# Patient Record
Sex: Male | Born: 1980 | Race: White | Hispanic: No | Marital: Single | Smoking: Current every day smoker
Health system: Southern US, Community
[De-identification: ages and names within clinical notes are randomized; demographics above are authoritative.]

## PROBLEM LIST (undated history)

## (undated) DIAGNOSIS — G4733 Obstructive sleep apnea (adult) (pediatric): Secondary | ICD-10-CM

## (undated) DIAGNOSIS — L0390 Cellulitis, unspecified: Secondary | ICD-10-CM

## (undated) DIAGNOSIS — D649 Anemia, unspecified: Secondary | ICD-10-CM

## (undated) DIAGNOSIS — N492 Inflammatory disorders of scrotum: Secondary | ICD-10-CM

## (undated) DIAGNOSIS — G4730 Sleep apnea, unspecified: Secondary | ICD-10-CM

## (undated) DIAGNOSIS — E662 Morbid (severe) obesity with alveolar hypoventilation: Secondary | ICD-10-CM

## (undated) DIAGNOSIS — I10 Essential (primary) hypertension: Secondary | ICD-10-CM

## (undated) DIAGNOSIS — M793 Panniculitis, unspecified: Secondary | ICD-10-CM

## (undated) DIAGNOSIS — Z8709 Personal history of other diseases of the respiratory system: Secondary | ICD-10-CM

## (undated) DIAGNOSIS — I509 Heart failure, unspecified: Secondary | ICD-10-CM

---

## 2003-04-07 ENCOUNTER — Emergency Department (HOSPITAL_COMMUNITY): Admission: EM | Admit: 2003-04-07 | Discharge: 2003-04-07 | Payer: Self-pay | Admitting: Emergency Medicine

## 2006-11-23 ENCOUNTER — Emergency Department (HOSPITAL_COMMUNITY): Admission: EM | Admit: 2006-11-23 | Discharge: 2006-11-24 | Payer: Self-pay | Admitting: Emergency Medicine

## 2006-12-27 ENCOUNTER — Inpatient Hospital Stay (HOSPITAL_COMMUNITY): Admission: EM | Admit: 2006-12-27 | Discharge: 2007-01-01 | Payer: Self-pay | Admitting: Emergency Medicine

## 2006-12-30 ENCOUNTER — Ambulatory Visit: Payer: Self-pay | Admitting: Vascular Surgery

## 2007-02-02 ENCOUNTER — Ambulatory Visit: Payer: Self-pay | Admitting: Internal Medicine

## 2007-02-03 ENCOUNTER — Ambulatory Visit: Payer: Self-pay | Admitting: *Deleted

## 2007-08-07 ENCOUNTER — Ambulatory Visit: Payer: Self-pay | Admitting: Nurse Practitioner

## 2008-01-25 ENCOUNTER — Ambulatory Visit: Payer: Self-pay | Admitting: Internal Medicine

## 2008-10-07 ENCOUNTER — Ambulatory Visit: Payer: Self-pay | Admitting: Vascular Surgery

## 2008-10-07 ENCOUNTER — Ambulatory Visit: Payer: Self-pay | Admitting: Internal Medicine

## 2008-10-07 ENCOUNTER — Encounter (INDEPENDENT_AMBULATORY_CARE_PROVIDER_SITE_OTHER): Payer: Self-pay | Admitting: Emergency Medicine

## 2008-10-07 ENCOUNTER — Inpatient Hospital Stay (HOSPITAL_COMMUNITY): Admission: EM | Admit: 2008-10-07 | Discharge: 2008-10-10 | Payer: Self-pay | Admitting: Emergency Medicine

## 2008-10-07 ENCOUNTER — Encounter: Payer: Self-pay | Admitting: Cardiovascular Disease

## 2008-10-07 ENCOUNTER — Ambulatory Visit: Payer: Self-pay | Admitting: Family Medicine

## 2008-10-08 ENCOUNTER — Encounter (INDEPENDENT_AMBULATORY_CARE_PROVIDER_SITE_OTHER): Payer: Self-pay | Admitting: Internal Medicine

## 2008-11-07 ENCOUNTER — Encounter: Payer: Self-pay | Admitting: Cardiovascular Disease

## 2008-11-07 ENCOUNTER — Ambulatory Visit: Payer: Self-pay | Admitting: Family Medicine

## 2008-11-09 ENCOUNTER — Encounter (INDEPENDENT_AMBULATORY_CARE_PROVIDER_SITE_OTHER): Payer: Self-pay | Admitting: *Deleted

## 2008-11-12 DIAGNOSIS — E678 Other specified hyperalimentation: Secondary | ICD-10-CM | POA: Insufficient documentation

## 2008-11-12 DIAGNOSIS — L03119 Cellulitis of unspecified part of limb: Secondary | ICD-10-CM

## 2008-11-12 DIAGNOSIS — I10 Essential (primary) hypertension: Secondary | ICD-10-CM

## 2008-11-12 DIAGNOSIS — Z6841 Body Mass Index (BMI) 40.0 and over, adult: Secondary | ICD-10-CM

## 2008-11-12 DIAGNOSIS — G4733 Obstructive sleep apnea (adult) (pediatric): Secondary | ICD-10-CM

## 2008-11-12 DIAGNOSIS — L02419 Cutaneous abscess of limb, unspecified: Secondary | ICD-10-CM | POA: Insufficient documentation

## 2009-01-16 ENCOUNTER — Emergency Department (HOSPITAL_COMMUNITY): Admission: EM | Admit: 2009-01-16 | Discharge: 2009-01-17 | Payer: Self-pay | Admitting: Emergency Medicine

## 2009-01-23 ENCOUNTER — Inpatient Hospital Stay (HOSPITAL_COMMUNITY): Admission: EM | Admit: 2009-01-23 | Discharge: 2009-01-31 | Payer: Self-pay | Admitting: Emergency Medicine

## 2009-02-21 ENCOUNTER — Ambulatory Visit: Payer: Self-pay | Admitting: Internal Medicine

## 2009-02-21 ENCOUNTER — Encounter (INDEPENDENT_AMBULATORY_CARE_PROVIDER_SITE_OTHER): Payer: Self-pay | Admitting: Internal Medicine

## 2009-02-21 LAB — CONVERTED CEMR LAB
ALT: 18 units/L (ref 0–53)
CO2: 29 meq/L (ref 19–32)
Calcium: 9.3 mg/dL (ref 8.4–10.5)
Chloride: 93 meq/L — ABNORMAL LOW (ref 96–112)
Creatinine, Ser: 0.91 mg/dL (ref 0.40–1.50)
Eosinophils Absolute: 0.1 10*3/uL (ref 0.0–0.7)
Glucose, Bld: 116 mg/dL — ABNORMAL HIGH (ref 70–99)
HCT: 48.8 % (ref 39.0–52.0)
Hemoglobin: 15 g/dL (ref 13.0–17.0)
Lymphs Abs: 1.9 10*3/uL (ref 0.7–4.0)
MCHC: 30.7 g/dL (ref 30.0–36.0)
MCV: 78 fL (ref 78.0–100.0)
Monocytes Absolute: 1.3 10*3/uL — ABNORMAL HIGH (ref 0.1–1.0)
Monocytes Relative: 14 % — ABNORMAL HIGH (ref 3–12)
Neutrophils Relative %: 64 % (ref 43–77)
Pro B Natriuretic peptide (BNP): 12.6 pg/mL (ref 0.0–100.0)
RBC: 6.26 M/uL — ABNORMAL HIGH (ref 4.22–5.81)
Total Protein: 8 g/dL (ref 6.0–8.3)
WBC: 9.3 10*3/uL (ref 4.0–10.5)

## 2009-03-07 ENCOUNTER — Ambulatory Visit: Payer: Self-pay | Admitting: Internal Medicine

## 2009-03-07 ENCOUNTER — Encounter (INDEPENDENT_AMBULATORY_CARE_PROVIDER_SITE_OTHER): Payer: Self-pay | Admitting: Internal Medicine

## 2009-03-07 LAB — CONVERTED CEMR LAB: Potassium: 4 meq/L (ref 3.5–5.3)

## 2009-03-08 ENCOUNTER — Encounter (INDEPENDENT_AMBULATORY_CARE_PROVIDER_SITE_OTHER): Payer: Self-pay | Admitting: Internal Medicine

## 2009-03-23 ENCOUNTER — Encounter (INDEPENDENT_AMBULATORY_CARE_PROVIDER_SITE_OTHER): Payer: Self-pay | Admitting: *Deleted

## 2009-03-23 ENCOUNTER — Ambulatory Visit: Payer: Self-pay | Admitting: Cardiology

## 2009-03-23 ENCOUNTER — Encounter: Payer: Self-pay | Admitting: Cardiology

## 2009-03-23 ENCOUNTER — Ambulatory Visit: Payer: Self-pay

## 2009-03-23 DIAGNOSIS — F17200 Nicotine dependence, unspecified, uncomplicated: Secondary | ICD-10-CM

## 2009-03-23 DIAGNOSIS — R609 Edema, unspecified: Secondary | ICD-10-CM

## 2009-03-28 ENCOUNTER — Ambulatory Visit: Payer: Self-pay | Admitting: Internal Medicine

## 2009-05-23 ENCOUNTER — Encounter (INDEPENDENT_AMBULATORY_CARE_PROVIDER_SITE_OTHER): Payer: Self-pay | Admitting: *Deleted

## 2010-10-21 LAB — URINALYSIS, ROUTINE W REFLEX MICROSCOPIC
Bilirubin Urine: NEGATIVE
Glucose, UA: NEGATIVE mg/dL
Leukocytes, UA: NEGATIVE
Nitrite: NEGATIVE
Protein, ur: 100 mg/dL — AB
Specific Gravity, Urine: 1.023 (ref 1.005–1.030)
Urobilinogen, UA: 2 mg/dL — ABNORMAL HIGH (ref 0.0–1.0)
pH: 5.5 (ref 5.0–8.0)

## 2010-10-21 LAB — CBC
HCT: 39.7 % (ref 39.0–52.0)
HCT: 40.6 % (ref 39.0–52.0)
HCT: 42.4 % (ref 39.0–52.0)
Hemoglobin: 12 g/dL — ABNORMAL LOW (ref 13.0–17.0)
Hemoglobin: 12.4 g/dL — ABNORMAL LOW (ref 13.0–17.0)
MCHC: 30.3 g/dL (ref 30.0–36.0)
MCHC: 30.5 g/dL (ref 30.0–36.0)
MCV: 75.6 fL — ABNORMAL LOW (ref 78.0–100.0)
MCV: 74.3 fL — ABNORMAL LOW (ref 78.0–100.0)
MCV: 74.4 fL — ABNORMAL LOW (ref 78.0–100.0)
MCV: 74.8 fL — ABNORMAL LOW (ref 78.0–100.0)
MCV: 75 fL — ABNORMAL LOW (ref 78.0–100.0)
Platelets: 190 10*3/uL (ref 150–400)
Platelets: 194 10*3/uL (ref 150–400)
Platelets: 200 10*3/uL (ref 150–400)
Platelets: 226 10*3/uL (ref 150–400)
RBC: 5.3 MIL/uL (ref 4.22–5.81)
RBC: 5.5 MIL/uL (ref 4.22–5.81)
RDW: 19.3 % — ABNORMAL HIGH (ref 11.5–15.5)
WBC: 8.2 10*3/uL (ref 4.0–10.5)
WBC: 10 10*3/uL (ref 4.0–10.5)
WBC: 11.8 10*3/uL — ABNORMAL HIGH (ref 4.0–10.5)
WBC: 9.9 10*3/uL (ref 4.0–10.5)

## 2010-10-21 LAB — BASIC METABOLIC PANEL
BUN: 4 mg/dL — ABNORMAL LOW (ref 6–23)
BUN: 6 mg/dL (ref 6–23)
BUN: 6 mg/dL (ref 6–23)
BUN: 5 mg/dL — ABNORMAL LOW (ref 6–23)
CO2: 34 mEq/L — ABNORMAL HIGH (ref 19–32)
CO2: 37 mEq/L — ABNORMAL HIGH (ref 19–32)
Calcium: 8.6 mg/dL (ref 8.4–10.5)
Calcium: 8.8 mg/dL (ref 8.4–10.5)
Calcium: 9.1 mg/dL (ref 8.4–10.5)
Calcium: 8.4 mg/dL (ref 8.4–10.5)
Chloride: 92 mEq/L — ABNORMAL LOW (ref 96–112)
Chloride: 100 mEq/L (ref 96–112)
Chloride: 100 mEq/L (ref 96–112)
Chloride: 97 mEq/L (ref 96–112)
Creatinine, Ser: 0.78 mg/dL (ref 0.4–1.5)
Creatinine, Ser: 0.8 mg/dL (ref 0.4–1.5)
Creatinine, Ser: 0.64 mg/dL (ref 0.4–1.5)
GFR calc Af Amer: >60 mL/min (ref >60)
GFR calc Af Amer: >60 mL/min (ref >60)
GFR calc Af Amer: >60 mL/min (ref >60)
GFR calc non Af Amer: >60 mL/min (ref >60)
GFR calc non Af Amer: >60 mL/min (ref >60)
GFR calc non Af Amer: >60 mL/min (ref >60)
Glucose, Bld: 113 mg/dL — ABNORMAL HIGH (ref 70–99)
Glucose, Bld: 99 mg/dL (ref 70–99)
Glucose, Bld: 95 mg/dL (ref 70–99)
Potassium: 4 mEq/L (ref 3.5–5.1)
Potassium: 4.2 mEq/L (ref 3.5–5.1)
Potassium: 4.2 mEq/L (ref 3.5–5.1)
Sodium: 136 mEq/L (ref 135–145)
Sodium: 136 mEq/L (ref 135–145)
Sodium: 138 mEq/L (ref 135–145)

## 2010-10-21 LAB — URINE MICROSCOPIC-ADD ON

## 2010-10-21 LAB — DIFFERENTIAL
Basophils Absolute: 0.1 10*3/uL (ref 0.0–0.1)
Basophils Relative: 0 % (ref 0–1)
Basophils Relative: 1 % (ref 0–1)
Eosinophils Absolute: 0.2 10*3/uL (ref 0.0–0.7)
Eosinophils Absolute: 0.2 10*3/uL (ref 0.0–0.7)
Lymphocytes Relative: 10 % — ABNORMAL LOW (ref 12–46)
Lymphocytes Relative: 13 % (ref 12–46)
Lymphs Abs: 1.1 10*3/uL (ref 0.7–4.0)
Lymphs Abs: 1.5 10*3/uL (ref 0.7–4.0)
Lymphs Abs: 1.6 10*3/uL (ref 0.7–4.0)
Neutro Abs: 8.3 10*3/uL — ABNORMAL HIGH (ref 1.7–7.7)
Neutro Abs: 9.1 10*3/uL — ABNORMAL HIGH (ref 1.7–7.7)
Neutrophils Relative %: 69 % (ref 43–77)

## 2010-10-21 LAB — POCT I-STAT, CHEM 8
BUN: 11 mg/dL (ref 6–23)
Calcium, Ion: 1.02 mmol/L — ABNORMAL LOW (ref 1.12–1.32)
Chloride: 97 mEq/L (ref 96–112)
Creatinine, Ser: 0.9 mg/dL (ref 0.4–1.5)
Glucose, Bld: 110 mg/dL — ABNORMAL HIGH (ref 70–99)
HCT: 47 % (ref 39.0–52.0)
Hemoglobin: 16 g/dL (ref 13.0–17.0)
Potassium: 4.8 mEq/L (ref 3.5–5.1)
Sodium: 136 mEq/L (ref 135–145)
TCO2: 35 mmol/L (ref 0–100)

## 2010-10-21 LAB — BRAIN NATRIURETIC PEPTIDE
Pro B Natriuretic peptide (BNP): 232 pg/mL — ABNORMAL HIGH (ref 0.0–100.0)
Pro B Natriuretic peptide (BNP): 205 pg/mL — ABNORMAL HIGH (ref 0.0–100.0)

## 2010-10-21 LAB — CULTURE, BLOOD (ROUTINE X 2)
Culture: NO GROWTH
Culture: NO GROWTH

## 2010-10-21 LAB — RETICULOCYTES: Retic Count, Absolute: 142.5 10*3/uL (ref 19.0–186.0)

## 2010-10-21 LAB — FERRITIN: Ferritin: 12 ng/mL — ABNORMAL LOW (ref 22–322)

## 2010-10-21 LAB — IRON AND TIBC
Iron: 20 ug/dL — ABNORMAL LOW (ref 42–135)
TIBC: 390 ug/dL (ref 215–435)

## 2010-10-21 LAB — VANCOMYCIN, TROUGH: Vancomycin Tr: 14.2 ug/mL (ref 10.0–20.0)

## 2010-10-25 LAB — CBC
HCT: 38.5 % — ABNORMAL LOW (ref 39.0–52.0)
Hemoglobin: 12 g/dL — ABNORMAL LOW (ref 13.0–17.0)
Hemoglobin: 12.2 g/dL — ABNORMAL LOW (ref 13.0–17.0)
MCHC: 31.2 g/dL (ref 30.0–36.0)
MCHC: 31.5 g/dL (ref 30.0–36.0)
MCV: 76 fL — ABNORMAL LOW (ref 78.0–100.0)
MCV: 76.8 fL — ABNORMAL LOW (ref 78.0–100.0)
MCV: 77 fL — ABNORMAL LOW (ref 78.0–100.0)
Platelets: 277 10*3/uL (ref 150–400)
Platelets: 281 10*3/uL (ref 150–400)
RBC: 4.97 MIL/uL (ref 4.22–5.81)
RBC: 4.99 MIL/uL (ref 4.22–5.81)
RDW: 17.1 % — ABNORMAL HIGH (ref 11.5–15.5)
RDW: 17.4 % — ABNORMAL HIGH (ref 11.5–15.5)
WBC: 13.4 10*3/uL — ABNORMAL HIGH (ref 4.0–10.5)
WBC: 9.2 10*3/uL (ref 4.0–10.5)

## 2010-10-25 LAB — BASIC METABOLIC PANEL
BUN: 8 mg/dL (ref 6–23)
BUN: 9 mg/dL (ref 6–23)
CO2: 29 mEq/L (ref 19–32)
CO2: 34 mEq/L — ABNORMAL HIGH (ref 19–32)
Chloride: 98 mEq/L (ref 96–112)
Chloride: 99 mEq/L (ref 96–112)
Chloride: 99 mEq/L (ref 96–112)
Chloride: 99 mEq/L (ref 96–112)
Creatinine, Ser: 0.63 mg/dL (ref 0.4–1.5)
Creatinine, Ser: 0.65 mg/dL (ref 0.4–1.5)
Creatinine, Ser: 0.67 mg/dL (ref 0.4–1.5)
GFR calc Af Amer: >60 mL/min (ref >60)
GFR calc Af Amer: >60 mL/min (ref >60)
GFR calc non Af Amer: >60 mL/min (ref >60)
Potassium: 4.3 mEq/L (ref 3.5–5.1)
Potassium: 4.8 mEq/L (ref 3.5–5.1)
Sodium: 137 mEq/L (ref 135–145)

## 2010-10-25 LAB — URINALYSIS, MICROSCOPIC ONLY
Bilirubin Urine: NEGATIVE
Nitrite: NEGATIVE
Protein, ur: 30 mg/dL — AB
Urobilinogen, UA: 1 mg/dL (ref 0.0–1.0)

## 2010-10-25 LAB — CULTURE, BLOOD (ROUTINE X 2)

## 2010-10-25 LAB — DIFFERENTIAL
Basophils Absolute: 0 10*3/uL (ref 0.0–0.1)
Basophils Relative: 0 % (ref 0–1)
Eosinophils Absolute: 0.1 10*3/uL (ref 0.0–0.7)
Eosinophils Absolute: 0.1 10*3/uL (ref 0.0–0.7)
Lymphs Abs: 1.3 10*3/uL (ref 0.7–4.0)
Lymphs Abs: 1.6 10*3/uL (ref 0.7–4.0)
Monocytes Relative: 13 % — ABNORMAL HIGH (ref 3–12)
Neutrophils Relative %: 71 % (ref 43–77)
Neutrophils Relative %: 76 % (ref 43–77)

## 2010-10-25 LAB — HEPATIC FUNCTION PANEL
AST: 19 U/L (ref 0–37)
Albumin: 2.8 g/dL — ABNORMAL LOW (ref 3.5–5.2)
Total Protein: 7.7 g/dL (ref 6.0–8.3)

## 2010-10-25 LAB — HEMOGLOBIN A1C
Hgb A1c MFr Bld: 5.4 % (ref 4.6–6.1)
Mean Plasma Glucose: 108 mg/dL

## 2010-10-25 LAB — RAPID URINE DRUG SCREEN, HOSP PERFORMED
Amphetamines: NOT DETECTED
Barbiturates: NOT DETECTED
Benzodiazepines: NOT DETECTED
Opiates: NOT DETECTED
Tetrahydrocannabinol: NOT DETECTED

## 2010-10-25 LAB — BLOOD GAS, ARTERIAL
Bicarbonate: 32.9 mEq/L — ABNORMAL HIGH (ref 20.0–24.0)
FIO2: 0.21 %
pCO2 arterial: 62.9 mmHg (ref 35.0–45.0)
pH, Arterial: 7.339 — ABNORMAL LOW (ref 7.350–7.450)
pO2, Arterial: 66.1 mmHg — ABNORMAL LOW (ref 80.0–100.0)

## 2010-10-25 LAB — LIPID PANEL
HDL: 35 mg/dL — ABNORMAL LOW (ref >39)
LDL Cholesterol: 71 mg/dL (ref 0–99)
Total CHOL/HDL Ratio: 3.4 RATIO
Triglycerides: 61 mg/dL (ref <150)
VLDL: 12 mg/dL (ref 0–40)

## 2010-10-25 LAB — APTT: aPTT: 33 seconds (ref 24–37)

## 2010-10-25 LAB — PROTIME-INR: Prothrombin Time: 16.1 seconds — ABNORMAL HIGH (ref 11.6–15.2)

## 2010-11-27 NOTE — Discharge Summary (Signed)
NAME:  Andre [name], Andre [name] NO.:  [id number]   MEDICAL RECORD NO.:  [id number]          PATIENT TYPE:  INP   LOCATION:  1340                         FACILITY:  [facility]   PHYSICIAN:  Hillery Aldo, M.D.   DATE OF BIRTH:  1980/10/25   DATE OF ADMISSION:  01/23/2009  DATE OF DISCHARGE:  01/31/2009                               DISCHARGE SUMMARY   PRIMARY CARE PHYSICIAN:  Dr. Donia Guiles at [facility].   DISCHARGE DIAGNOSES:  1. Scrotal cellulitis.  2. Iron-deficiency anemia.  3. Morbid obesity, body mass index 87.4.  4. Obstructive sleep apnea/obesity hypoventilation syndrome.  5. Bronchospasm.  6. Tobacco abuse.  7. Hypertension.  8. Probable diastolic congestive heart failure.   DISCHARGE MEDICATIONS:  1. Aspirin 81 mg p.o. daily.  2. Clonidine 0.2 mg p.o. b.i.d.  3. Ferrous sulfate 325 mg p.o. b.i.d.  4. Lasix 80 mg p.o. b.i.d.  5. Zaroxolyn 5 mg p.o. daily.  6. Potassium chloride 20 mEq p.o. b.i.d.  7. Bactrim DS 2 tabs p.o. q.12 hours x7 days.  8. Nystatin 100,000 units/g cream to scrotum topically q.12 hours.  9. Benadryl 25 mg p.o. q.4 hours p.r.n. pruritus.  10.Fluid restriction 1.5 liters per 24 hours.   CONSULTATIONS:  None.   BRIEF ADMISSION HPI:  The patient is a 30 year old morbidly obese male  who presented to the hospital with a chief complaint of scrotal swelling  and pain.  He was originally evaluated in the emergency department on  January 17, 2009 when an ultrasound was obtained, and did not show any  evidence of torsion or epididymal orchitis.  The patient was discharged  home on a combination of Keflex and doxycycline, but despite religiously  taking his medications represented to the hospital on January 23, 2009 with  ongoing pain and swelling.  He was subsequently referred to the  Hospitalist Service for admission.  For the full details, please see the  dictated report done by Dr. Sharon Seller.   PROCEDURES AND DIAGNOSTIC STUDIES:  1. Chest  x-ray on January 23, 2009 showed progressive cardiomegaly with      increased pulmonary vascular congestion.  2. Scrotal ultrasound on January 24, 2009 showed scrotal thickening      compatible with cellulitis.  Negative for abscess.  Negative for      epididymo-orchitis or torsion.  Right epididymal spermatocele or      cyst noted.   DISCHARGE LABORATORY VALUES:  Blood cultures were negative x2.  BNP was  232.  Sodium was 136, potassium 4.0, chloride 92, bicarb 40, BUN 8,  creatinine 0.78, glucose 113.  Calcium 8.9.  White blood cell count was  8.2, hemoglobin 12, hematocrit 39.6, platelets 190.   HOSPITAL COURSE:  1. Scrotal cellulitis:  The patient has now completed 7 days of      treatment with vancomycin and Zosyn, and has been transitioned over      to p.o. double strength Septra.  The cellulitis is beginning to      respond to a combination of IV and now p.o. antibiotics and      aggressive diuresis.  He will need close  follow-up after discharge      to ensure that his scrotum continues to decrease in size and      erythema.  2. Probable diastolic dysfunction/congestive heart failure:  The      patient did have elevated BNP values consistent with probable      congestive heart failure.  A two-dimensional echocardiogram was      obtained back in March 2010, but because of the patient's size was      not an adequate study.  Would recommend outpatient referral to      schedule a transesophageal echocardiogram for definitive evaluation      of his heart.  The patient has been put on aggressive diuresis      which has caused a much improved appearance of his lower extremity      swelling.  3. Iron-deficiency anemia:  The patient does have a mild anemia, and      anemia panel was done to evaluate the source of his anemia.  He did      have low iron at 20 with a normal total iron-binding capacity, 5%      saturation.  Ferritin was low at 12, and B12 as well as folic acid      studies were  normal.  This is consistent with iron-deficiency      anemia.  The patient was put on iron supplementation, but will need      outpatient GI evaluation at some point.  4. Morbid obesity:  The patient was extensively counseled by myself      and by the dietician regarding importance of appropriate weight      loss given his BMI of 87.4.  He is not a candidate for bariatric      surgery with a BMI this high.  5. Obstructive sleep apnea/obesity hypoventilation syndrome:  The      patient had been scheduled for an outpatient sleep study, but      canceled the appointment.  We have recommended that he follow up      and reschedule this appointment to prevent medical complications      and progression of underlying hypertension and pulmonary disease.  6. Tobacco abuse:  The patient was counseled extensively by myself      regarding importance of tobacco cessation given his comorbidities.  7. Hypertension:  The patient's Lasix, clonidine, and Zaroxolyn have      been reasonably controlling his blood pressures.  He will be      discharged on a slightly higher dose of clonidine secondary to a      diastolic pressure in the mid 90s.  He should have close follow-up      with his primary care physician for ongoing evaluation of his blood      pressure and dose titration of his medications as needed.   DISPOSITION:  The patient is medically stable and will be discharged  home.  We will have the social worker meet with him and discuss with him  how to apply for disability.  The patient is encouraged to follow up at  [facility] with Dr. Reche Dixon in 1-2 weeks.   Time spent coordinating care for discharge and discharge instructions  equals 35 minutes.      Hillery Aldo, M.D.  Electronically Signed    CR/MEDQ  D:  01/31/2009  T:  01/31/2009  Job:  [id number]   cc:   Dala Dock

## 2010-11-27 NOTE — Discharge Summary (Signed)
NAME:  Andre [name], Andre [name] NO.:  [id number]   MEDICAL RECORD NO.:  [id number]          PATIENT TYPE:  INP   LOCATION:  1535                         FACILITY:  [facility]   PHYSICIAN:  Marcellus Scott, MD     DATE OF BIRTH:  October 11, 1980   DATE OF ADMISSION:  12/27/2006  DATE OF DISCHARGE:  01/01/2007                               DISCHARGE SUMMARY   PRIMARY CARE PHYSICIAN:  The patient is unassigned to the [facility] but will follow up at [facility].   DISCHARGE DIAGNOSES:  1. Left lower extremity cellulitis.  2. Morbid obesity.  3. Rule out sleep apnea syndrome.  4. Newly diagnosed hypertension.  5. Hyperglycemia, question newly diagnosed type 2 diabetes.  6. Normocytic anemia.  7. Probable metabolic syndrome.  8. Mild transaminitis.   DISCHARGE MEDICATIONS:  1. Keflex 500 mg p.o. four times a day for an additional 10 days which      will complete a two-week course of antibiotics.  2. Hydrochlorothiazide 25 mg p.o. daily.  3. Tylenol 650 mg p.o. q.6h. p.r.n.  4. Prilosec over-the-counter 20 mg p.o. daily.  5. Albuterol 90 mcg per spray MDI, two puffs q.4-6h. p.r.n.   PROCEDURES:  1. Left lower extremity Doppler which was done on December 30, 2006.      Impression:  No evidence of DVT, superficial thrombosis, or Baker's      cyst.  2. On December 27, 2006, again, left lower extremity Doppler with no      obvious evidence of DVT, superficial thrombosis, or Baker's cyst.   PERTINENT LABORATORY DATA:  Hepatic panel remarkable for AST of 49, ALT  45, albumin 2.4, alkaline phosphatase 66, total bilirubin of 0.9.  Basic  metabolic panel unremarkable, with a BUN of 6, creatinine of 0.69, and  glucose of 113.  CBC with hemoglobin 11.5, hematocrit 34.1, MCV 82.3,  platelets 220, white blood cells 9.1.  Wound culture with moderate group  A strep (Streptococcus pyogenes isolated).  Urine culture was no growth.  Blood culture with no growth to  date.  Final cultures to be followed up  as an outpatient.  TSH 2.594.  hemoglobin A1C 6.2.  D-dimer 1.68.  Point-  of-care cardiac markers were negative.   CONSULTATIONS:  Nutrition education.   HOSPITAL COURSE AND PATIENT DISPOSITION:  For details of the initial  part of the admission, please refer to the history and physical note  done by Dr. Darnelle Catalan on December 27, 2006.  In summary, Mr. Biernat is a 30-  year-old pleasant, morbidly obese male patient with no known past  medical history who presented with complaints of left lower extremity  erythema, swelling, pain, fever, and chills for 3 days prior to  admission.  Further evaluation revealed extensive left lower extremity  cellulitis.  He was thereby admitted for further evaluation and  management.  He had a white blood cell count of 19.5 and a temperature  of 101.4 on admission.   Problem 1.  Severe left lower extremity cellulitis.  The patient was  admitted to the hospital.  Blood cultures were sent  off.  A wound  culture from weeping serosanguineous exudate was also sent.  The culture  results are as above.  The patient was initially placed on IV Zosyn  alone.  Subsequently, vancomycin was added, thinking that there was not  adequate improvement.  The patient's left lower extremity was also  elevated, and he was provided with pain medications.  With these  measures, the patient has progressively done well, with his redness and  pain almost resolved.  The swelling is slower to resolve; however, it is  improving.  The patient is able to ambulate with minimal pain on initial  few steps.  The patient will be discharged on oral Keflex to complete a  total 2-week course of antibiotics.  He has been advised to seek  immediate medical attention if there is any further deterioration in his  condition.   Problem 2.  Morbid obesity.  The patient was consulted by the  dieticians.  He has been instructed to strictly adhere to diet,   exercise, and weight loss.   Problem 3.  Rule out sleep apnea syndrome.  The patient was empirically  placed on CPAP in the hospital; however, he needs to have an outpatient  workup with a sleep study to rule out sleep apnea syndrome.  Obviously,  he needs to loose weight which will help this.   Problem 4.  Hypertension.  For mildly elevated blood pressures, the  patient was started on hydrochlorothiazide with improvement in his blood  pressures.   Problem 5.  Questionable newly diagnosed type 2 diabetes.  The patient  was placed on a sliding scale insulin here; however, he is not being  discharged on any medications at this time.  Again, for outpatient  diabetes education, diet, weight loss, and exercise, and this has to be  followed up as an outpatient.   Problem 6.  Normocytic anemia.  For outpatient evaluation as deemed  necessary.   Problem 7.  Questionable metabolic syndrome.  The patient already fits 3  criteria for this but to obtain fasting lipids as an outpatient.   Problem 8.  Mildly elevated AST and ALT probably secondary to fatty  liver.  The patient is asymptomatic.  Again, these are to be followed up  as an outpatient.      Marcellus Scott, MD  Electronically Signed     AH/MEDQ  D:  01/01/2007  T:  01/01/2007  Job:  [id number]   cc:   Dineen Kid. Reche Dixon, M.D.  Fax: [phone]

## 2010-11-27 NOTE — Discharge Summary (Signed)
NAME:  Andre [name], Andre [name] NO.:  [id number]   MEDICAL RECORD NO.:  [id number]          PATIENT TYPE:  INP   LOCATION:  5529                         FACILITY:  [facility]   PHYSICIAN:  Alvester Morin, M.D.  DATE OF BIRTH:  1981/06/21   DATE OF ADMISSION:  10/07/2008  DATE OF DISCHARGE:  10/10/2008                               DISCHARGE SUMMARY   DISCHARGE DIAGNOSES:  1. Left lower extremity cellulitis.  2. Morbid obesity.  3. Borderline hypertension.  4. Obstructive sleep apnea / Obesity hypoventilation syndrome.   DISCHARGE MEDICATIONS:  Doxycycline dose 100 mg, route p.o., frequency  b.i.d.  Note that the day of discharge is day #3 of 10.  The patient  should continue taking this medication until October 17, 2008.  Oxygen ([place] 2L)   DISPOSITION AND FOLLOWUP:  1. Left lower extremity cellulitis.  The patient should continue      taking doxycycline for 7 more days to complete a 10-day course.      The patient should be monitored for signs and symptoms of continued      infection including fever and white blood count elevation.  2. OSA/OHS.  The patient will need a formal outpatient sleep study as      part of a workup for obstructive sleep apnea/obesity      hypoventilation syndrome.   PROCEDURES PERFORMED:  1. Nuclear medicine ventilation and perfusion scan, date October 08, 2008.  Impression:  Low probability for pulmonary embolus.  2. Chest 2-veiw x-ray, date October 08, 2008.  Findings:  The lungs are      clear.  There is no pleural effusion.  The heart size is upper      normal.  No focal bony abnormality.  Impression:  No acute disease.  3. A 2-D echo.  Results were pending at discharge.  4. Bilateral lower extremity Dopplers were negative for deep venous      thrombosis.   CONSULTATIONS:  None.   HISTORY AND PHYSICAL:  A 30 year old morbidly obese male with past  medical history of left lower extremity cellulitis in 2008, presents  with left lower  extremity swelling and redness that started 1 week ago  and is associated with mild discomfort, located below the knee area.  He  reports swelling at baseline since 2008.  Denies fevers, chills, chest  pain, or shortness of breath.  The patient reports being ambulatory at  home, but did not notice pain with ambulation in his left lower  extremity.  The patient denies other systemic symptoms.  No recent  history of trauma, traveling, or sick contacts.   Physical exam:  General=Morbidly obese, laying on bed in not acute distress.  HEENT= eyes perrla, EOMI, no nystagmus, grossly intact vision. Pt's head  was atraumatic and normocephalic. Normal ears. Neck was short, supple  and w/o bruits or masses. Throat w/o erythema or plaques.  Lungs= clear to auscultation bilaterally, no crackles, no wheezing.  Cardiovascular= regular rate and rhythm, no murmurs, no gallops and no  rubs.  Abdomen= obese, protuberant, soft, no masses and positive bowel sounds.  Ext= left leg tortuoses, with mild erythema and warmness to touch,  unable to appreciate any pulses. right leg was also increased in size  secondary to probably lymphedema, no pain and w/o erythema.  Neurology= pt was alert, awake, oriented X3; non focal deficit  appreciated; normal cranial nerves II- XII.   VITAL SIGNS ON ADMISSION:  Blood pressure 133/58, heart rate 117,  respirations 22, temperature 98.9, and saturations 98% on room air.   ADMITTING LABORATORY DATA:  CBC is as follows:  WBC 13.4, hemoglobin  13.0, platelets 277, and MCV 76.  Basic metabolic panel:  Sodium 131,  potassium 4.3, chloride 99, bicarbonate 24, BUN 9, creatinine 0.67,  glucose 107, and calcium 8.4.   HOSPITAL COURSE:  1. Bilateral lower extremity swelling.  The patient was worked up for      deep venous thrombosis versus cellulitis.  DVT was deemed unlikely      given that the bilateral lower extremity Dopplers were negative,      and his Wells score was less  than 2.  The patient did have an      increased white count of 13.4, which resolved with treatment with      doxycycline consistent with a cellulitic process.  Blood cultures      remained negative throughout the hospitalization.   1. Hypertension.  Good control without medication.  He has had      increased risk secondary to obesity, but is borderline now.  He      will need to follow up as an outpatient and be encouraged to      undergo lifestyle changes, such as diet and exercise.   1. Morbid obesity.  The Nutrition Service was consulted and      recommendations were made.   1. Shortness of breath/desaturation on ambulation.  The patient's low      oxygen saturation at rest and on ambulation as well as his      hypercapnia and hypoxia at night suggested the patient has obesity      hypoventilation syndrome with a component of obstructive sleep      apnea, given his body habitus.  CPAP at night was started, but the      patient did not tolerate the device.  The patient will need to have      a mask fitted to encourage compliance with CPAP machine because the      patient did not tolerate the device during his hospitalization due      to discomfort.  The patient will also need a formal outpatient      sleep study for further workup of his obstructive sleep      apnea/obesity hypoventilation syndrome.  The patient will be      discharged on home oxygen.   DISCHARGE VITALS:  Blood pressure 138/80, heart rate 76, respirations  22, temperature 98.3, and saturations 93 to 95% on 2L oxygen.   DISCHARGE LABORATORY DATA:  CBC was as follows:  White count 8.0,  hemoglobin 12.0, hematocrit 38.1, platelets 233, MCV 76.8, and RDW 17.2.  Basic metabolic panel was as follows:  Sodium 138, potassium 5.1  (hemolyzed), chloride 99, bicarbonate 34, BUN 5, creatinine 0.63,  glucose 88.  ABG was as follows: pH 7.339, pCO2 of 62.9, pO2 of 66.1,  bicarbonate 32.9, TCO2 of 34.8, acid-base excess 7.2, and  O2 saturations  91.8%.      Rosanna Randy, MD  Electronically Signed  Alvester Morin, M.D.  Electronically Signed    CEM/MEDQ  D:  10/10/2008  T:  10/11/2008  Job:  [id number]   cc:   Dr. Elmo Putt

## 2010-11-27 NOTE — H&P (Signed)
NAME:  Andre [name], Andre [name] NO.:  [id number]   MEDICAL RECORD NO.:  [id number]          PATIENT TYPE:  INP   LOCATION:  1535                         FACILITY:  [facility]   PHYSICIAN:  Hillery Aldo, M.D.   DATE OF BIRTH:  10-29-1980   DATE OF ADMISSION:  12/27/2006  DATE OF DISCHARGE:                              HISTORY & PHYSICAL   PRIMARY CARE PHYSICIAN:  None.   CHIEF COMPLAINT:  Swollen, painful left lower extremity, fever.   HISTORY OF PRESENT ILLNESS:  The patient is a 30 year old morbidly obese  male, comes in with a chief complaint of increasing left lower extremity  erythema, swelling and pain over the past 3 days.  He also has had  shaking chills accompanied by fever.  The patient rates the pain as an  8/10.  He denies any recent prolonged travel.  He denies any known  injury to his lower extremity.  Upon initial evaluation in the ER, he is  found to have an extensive left lower extremity cellulitis and he is  being admitted for IV antibiotic therapy.   PAST MEDICAL HISTORY:  Morbid obesity.  The patient does not have any  other medical illnesses.  He has not had any surgeries.  Specifically,  he denies any history of asthma, heart disease or diabetes.   FAMILY HISTORY:  The patient's mother is alive in her 52s and suffers  with arthritis.  The patient's father is alive in his 63s and has  recently had cancer of a cancerous tumor removed from behind his eye.  He has two healthy siblings.   SOCIAL HISTORY:  The patient is single and lives with his parents.  He  smokes less than a pack of tobacco daily.  He denies alcohol or drug  use.  He is currently unemployed.   ALLERGIES:  NO KNOWN DRUG ALLERGIES.   MEDICATIONS:  None.   REVIEW OF SYSTEMS:  Again, the patient endorses fever and chills.  He  has had diminished appetite sporadically with episodes of fever.  He  denies any chest pain, shortness of breath, or cough.  He has not moved  his bowels in  several days but overall there has not been any change in  bowel habits, melena or hematochezia.  No dysuria.  He does endorse some  polyuria and polydipsia.  His energy level is fair.   PHYSICAL EXAMINATION:  VITAL SIGNS:  Temperature 101.4, pulse 110,  respirations 20, blood pressure 123/50, O2 saturation 94% on room air.  GENERAL:  A morbidly obese male who is in no acute distress.  HEENT:  Normocephalic, atraumatic.  PERRL.  EOMI.  Oropharynx clear.  NECK:  Supple, no thyromegaly, no lymphadenopathy, no jugular venous  distension.  CHEST:  Lungs clear to auscultation bilaterally with good air movement.  HEART:  Regular rate, regular rhythm, tachycardiac rate.  No murmurs,  rubs, or back gallops.  ABDOMEN:  Soft, nontender, nondistended with normoactive bowel sounds.  EXTREMITIES:  The patient's left lower extremity is extremely swollen  and erythematous.  There are excoriated areas that are weeping  serosanguineous drainage.  There is an  erythematous streak coarsening of  the upper thigh as well.  SKIN:  Warm and dry.  No rashes.  NEUROLOGIC:  The patient is alert and oriented x3.  Nonfocal.   DATA REVIEWED:  White blood cell count is 19.5, hemoglobin 13.5,  hematocrit 39.8, platelet count 184, absolute neutrophil count of 15.4.  D-dimer is 1.68.  Sodium is 132, potassium 3.4, chloride 95, bicarb 28,  BUN 6, creatinine 1.03, glucose 123.  Liver function studies are within  normal limits with the exception of the abdomen which is slightly low at  2.8.  Urinalysis is negative for any signs of infection.   ASSESSMENT/PLAN:  1. Severe left lower extremity cellulitis: This time, I would obtain      cultures of the serosanguineous weeping exudate as well as blood      cultures x2.  Continue the Zosyn that was initiated in the      emergency department.  If he does not rapidly improve on Zosyn      monotherapy, would add vancomycin.  2. Morbid obesity: We will obtain dietician consults  for weight loss      instruction.  Would also check a TSH level and work him up for the      possibility of early onset diabetes by checking a hemoglobin A1c      and monitoring his fasting blood glucoses.  3. Hypokalemia: Will replete.  4. Mild hyponatremia: Will monitor.  5. Tobacco abuse: Will provide a nicotine patch p.r.n. and counsel on      cessation.  6. Prophylaxis: Will initiate DVT prophylaxis with Lovenox, GI      prophylaxis with Protonix.      Hillery Aldo, M.D.  Electronically Signed     CR/MEDQ  D:  12/27/2006  T:  12/28/2006  Job:  [id number]

## 2010-11-27 NOTE — H&P (Signed)
NAME:  Andre [name], CAFFREY NO.:  [id number]   MEDICAL RECORD NO.:  [id number]          PATIENT TYPE:  EMS   LOCATION:  ED                           FACILITY:  [facility]   PHYSICIAN:  Lonia Blood, M.D.DATE OF BIRTH:  October 07, 1980   DATE OF ADMISSION:  01/23/2009  DATE OF DISCHARGE:                              HISTORY & PHYSICAL   PRIMARY CARE PHYSICIAN:  [facility].   CHIEF COMPLAINT:  Persistent scrotal swelling and pain.   HISTORY OF PRESENT ILLNESS:  Mr. Andre [name]. Pine is a morbidly  obese 30 year old gentleman who weighs in excess of 600 pounds.  He was  originally seen in the emergency room on January 17, 2009 with then a  several-day history of severe swelling of the scrotum associated with  pain.  At that time, his scrotum was found to be erythematous and quite  enlarged.  Ultrasound of the scrotum was carried out, which revealed  normal testicles and no evidence of torsion or epididymal orchitis.  There was evidence of simple scrotal wall thickening.  The patient was  placed on Keflex and Doxycycline and discharged home.  The patient  states that he has been religiously taking his antibiotics.  He states  that the swelling has decreased somewhat, but the pain has continued  without significant improvement.  He, therefore, presented back to the  emergency room today.  He was found to again have a significantly  swollen scrotum that was also noted to be very erythematous.   The patient is otherwise without complaints.  He denies fevers or  chills, nausea or vomiting, shortness of breath.  There is no difficulty  with urination and no dysuria.  There is no abdominal pain, diarrhea.   REVIEW OF SYSTEMS:  Comprehensive review of systems is unremarkable with  the exception of the positive elements noted in the history of present  illness above.  Of note, the patient states that he was scheduled to  have a sleep study, but missed it because of a death in the  family, and  is awaiting rescheduling of this test in the outpatient setting.   PAST MEDICAL HISTORY:  1. Morbidly obese.  2. History of recurrent lower extremity cellulitis and panniculitis.  3. Obesity hypoventilation syndrome/sleep apnea.  4. Borderline hypertension.  5. Chronic microcytic anemia.  6. Tobacco abuse.   OUTPATIENT MEDICATIONS:  1. Keflex.  2. Doxycycline.  3. Vicodin.   ALLERGIES:  No known drug allergies.   FAMILY HISTORY:  The patient's mother is alive and suffers with severe  arthritis.  The patient's father is alive, but did have a cancer of the  eye.  The patient has 2 healthy siblings otherwise.   SOCIAL HISTORY:  The patient is single.  He lives with his parents.  He  does not partake of alcohol.   DATA REVIEW:  White count is elevated at 11.8 with a hemoglobin of 12.4  and an MCV of 75.  Platelet count is normal.  BMET is unrevealing.  Calcium is 8.4.  Urinalysis reveals 100 protein but 0-2 white blood  cells and 0-2 red blood cells and  no leukocyte esterase or nitrites.   PHYSICAL EXAMINATION:  VITAL SIGNS:  Temperature 98.1, blood pressure  133/61, heart rate 91, respiratory rate 12, O2 saturation 93% on room  air.  GENERAL:  Morbidly obese gentleman in no acute respiratory distress.  LUNGS:  Clear to auscultation bilaterally without wheeze, rales, or  rhonchi.  CARDIOVASCULAR:  Very distant heart sounds, but regular, without gallop  or rub.  Normal S1, S2.  ABDOMEN:  Morbidly obese, soft.  Bowel sounds hypoactive/distant but  appreciable.  There is no tenderness to palpation of the abdomen.  I am  unable to comment on masses.  EXTREMITIES:  2+ bilateral lower extremity edema with changes of chronic  venous stasis/stasis dermatitis.  GENITOURINARY:  The patient's panus is retracted to reveal a markedly  enlarged scrotum at approximately the size of a grapefruit.  There is  significant induration of the scrotal skin with erythema.  There is no   purulent discharge or weeping.  There is no necrotic focus or abscess.  The penis is retracted into the edematous scrotum and is unable to be  appreciated.   IMPRESSION AND PLAN:  1. Severe cellulitis of the scrotum.  Mr. Marsala has failed an      outpatient treatment course with Keflex and Doxycycline.  Physical      exam is quite impressive.  The patient himself states that the size      of the scrotum has actually decreased.  Despite his significant      size, he states that it is not normal for him to have an edematous      scrotum.  In my opinion, we are likely dealing with a combination      of simple edema secondary to the patient's morbid obesity and      diminished venous return, complicated by an overlying simple      cellulitis.  There is no evidence of significant necrosis or      fasciitis at the present time.  We will elevate the scrotum on a      rolled towel.  We will broaden the antibiotics with Zosyn and      vancomycin.  If purulent drainage is appreciated, we will send this      for culture.  It is important to note that the patient has had an      ultrasound, which did reveal normal testicles and ruled out torsion      or epididymal orchiditis.  2. Tobacco abuse.  I have counseled the patient as to the multiple      deleterious effects of ongoing tobacco abuse.  I have advised him      to discontinue use completely and immediately.  We will request      tobacco cessation consultation to further drive this point home      during the patient's hospital stay.  3. Microcytic anemia, the patient does not appear to have had a recent      evaluation of his microcytic anemia.  I will send an anemia panel      to evaluate.  If this proves to be iron deficiency, we will      consider further evaluation, though colonoscopy on this gentleman      would be quite difficult.  This could simply be an anemia related      to his now semi-chronic inflammatory status/cellulitis.  As  noted      above, further investigation will be directed by the  results of our      initial screening tests.  4. Obesity hypoventilation syndrome/sleep apnea.  I have encouraged      the patient to reschedule and keep his appointment for sleep study      in the outpatient setting.  I have advised him of the importance of      this in      attempting to diminish long-term pulmonary hypertension and      resultant shortening of his life span.  5. Morbid obesity.  I have counseled the patient as to the absolute      need for him to undergo significant weight loss.  We will request a      nutrition consult to educate the patient on healthy dietary habits.      Lonia Blood, M.D.  Electronically Signed     JTM/MEDQ  D:  01/23/2009  T:  01/23/2009  Job:  [id number]   cc:   Melvern Banker  Fax: [phone]

## 2011-05-01 LAB — BASIC METABOLIC PANEL
BUN: 6
BUN: 8
CO2: 25
CO2: 31
Calcium: 8 — ABNORMAL LOW
Calcium: 8.3 — ABNORMAL LOW
Calcium: 8.4
Chloride: 98
Creatinine, Ser: 0.69
Creatinine, Ser: 0.84
GFR calc Af Amer: >60
GFR calc Af Amer: >60
GFR calc Af Amer: >60
GFR calc non Af Amer: >60
GFR calc non Af Amer: >60
Glucose, Bld: 113 — ABNORMAL HIGH
Glucose, Bld: 134 — ABNORMAL HIGH
Potassium: 3.5
Sodium: 136
Sodium: 136

## 2011-05-01 LAB — CBC
HCT: 34.1 — ABNORMAL LOW
Hemoglobin: 11.5 — ABNORMAL LOW
Hemoglobin: 13.6
MCHC: 33.7
MCHC: 34.1
MCV: 82.3
Platelets: 220
RBC: 4.14 — ABNORMAL LOW
RBC: 4.87
RDW: 13.9
RDW: 13.9
RDW: 14.2 — ABNORMAL HIGH
WBC: 9.1

## 2011-05-01 LAB — HEPATIC FUNCTION PANEL
ALT: 45
Albumin: 2.4 — ABNORMAL LOW
Alkaline Phosphatase: 66
Total Bilirubin: 0.9

## 2011-05-02 LAB — URINALYSIS, ROUTINE W REFLEX MICROSCOPIC
Leukocytes, UA: NEGATIVE
Nitrite: NEGATIVE
Protein, ur: 100 — AB
Specific Gravity, Urine: 1.02
Urobilinogen, UA: 1

## 2011-05-02 LAB — CBC
HCT: 39.8
Hemoglobin: 13.5
MCV: 81.6
Platelets: 184
WBC: 19.5 — ABNORMAL HIGH

## 2011-05-02 LAB — CULTURE, BLOOD (ROUTINE X 2): Culture: NO GROWTH

## 2011-05-02 LAB — URINE CULTURE

## 2011-05-02 LAB — WOUND CULTURE

## 2011-05-02 LAB — COMPREHENSIVE METABOLIC PANEL
Alkaline Phosphatase: 75
BUN: 6
Chloride: 95 — ABNORMAL LOW
Creatinine, Ser: 1.03
GFR calc non Af Amer: >60
Glucose, Bld: 123 — ABNORMAL HIGH
Potassium: 3.4 — ABNORMAL LOW
Total Bilirubin: 0.9

## 2011-05-02 LAB — D-DIMER, QUANTITATIVE: D-Dimer, Quant: 1.68 — ABNORMAL HIGH

## 2011-05-02 LAB — DIFFERENTIAL
Basophils Absolute: 0
Basophils Relative: 0
Lymphocytes Relative: 8 — ABNORMAL LOW
Neutro Abs: 15.4 — ABNORMAL HIGH
Neutrophils Relative %: 79 — ABNORMAL HIGH

## 2011-05-02 LAB — HEMOGLOBIN A1C: Hgb A1c MFr Bld: 6.2 — ABNORMAL HIGH

## 2011-05-02 LAB — TSH: TSH: 2.594

## 2011-05-02 LAB — URINE MICROSCOPIC-ADD ON

## 2011-06-18 ENCOUNTER — Encounter: Payer: Self-pay | Admitting: *Deleted

## 2011-06-18 ENCOUNTER — Emergency Department (HOSPITAL_COMMUNITY)
Admission: EM | Admit: 2011-06-18 | Discharge: 2011-06-18 | Disposition: A | Discharge status: Home / Self Care | Payer: Self-pay | Attending: Emergency Medicine | Admitting: Emergency Medicine

## 2011-06-18 DIAGNOSIS — L0390 Cellulitis, unspecified: Secondary | ICD-10-CM

## 2011-06-18 DIAGNOSIS — N390 Urinary tract infection, site not specified: Secondary | ICD-10-CM | POA: Insufficient documentation

## 2011-06-18 DIAGNOSIS — L0291 Cutaneous abscess, unspecified: Secondary | ICD-10-CM | POA: Insufficient documentation

## 2011-06-18 DIAGNOSIS — R21 Rash and other nonspecific skin eruption: Secondary | ICD-10-CM | POA: Insufficient documentation

## 2011-06-18 LAB — URINE MICROSCOPIC-ADD ON

## 2011-06-18 LAB — URINALYSIS, ROUTINE W REFLEX MICROSCOPIC
Glucose, UA: NEGATIVE mg/dL
Ketones, ur: NEGATIVE mg/dL
Leukocytes, UA: NEGATIVE
pH: 6 (ref 5.0–8.0)

## 2011-06-18 MED ORDER — MORPHINE SULFATE 4 MG/ML IJ SOLN: 4.0000 mg | Freq: Once | INTRAMUSCULAR | Status: DC

## 2011-06-18 MED ORDER — HYDROCORTISONE 1 % EX CREA: TOPICAL_CREAM | Freq: Two times a day (BID) | CUTANEOUS | Status: DC

## 2011-06-18 MED ORDER — CEPHALEXIN 500 MG PO CAPS: 500.0000 mg | ORAL_CAPSULE | Freq: Four times a day (QID) | ORAL | Status: DC

## 2011-06-18 MED ORDER — DOXYCYCLINE HYCLATE 100 MG PO CAPS: 100.0000 mg | ORAL_CAPSULE | Freq: Two times a day (BID) | ORAL | Status: DC

## 2011-06-18 MED ORDER — CLINDAMYCIN PHOSPHATE 600 MG/50ML IV SOLN
600.0000 mg | Freq: Once | INTRAVENOUS | Status: AC
  Administered 2011-06-18: 600 mg via INTRAVENOUS
  Filled 2011-06-18: qty 50

## 2011-06-18 MED ORDER — ONDANSETRON HCL 4 MG/2ML IJ SOLN
4.0000 mg | Freq: Once | INTRAMUSCULAR | Status: AC
  Administered 2011-06-18: 4 mg via INTRAVENOUS
  Filled 2011-06-18: qty 2

## 2011-06-18 MED ORDER — MORPHINE SULFATE 2 MG/ML IJ SOLN
INTRAMUSCULAR | Status: AC
  Administered 2011-06-18: 4 mg via INTRAVENOUS
  Filled 2011-06-18: qty 2

## 2011-06-18 MED ORDER — SODIUM CHLORIDE 0.9 % IV SOLN
Freq: Once | INTRAVENOUS | Status: AC
  Administered 2011-06-18: 20 mL/h via INTRAVENOUS

## 2011-06-18 MED ORDER — HYDROCORTISONE 1 % EX CREA: TOPICAL_CREAM | CUTANEOUS | Status: DC

## 2011-06-18 MED ORDER — OXYCODONE-ACETAMINOPHEN 5-325 MG PO TABS: 1.0000 | ORAL_TABLET | Freq: Four times a day (QID) | ORAL | Status: DC | PRN

## 2011-06-18 NOTE — ED Provider Notes (Signed)
Medical screening examination/treatment/procedure(s) were performed by non-physician practitioner and as supervising physician I was immediately available for consultation/collaboration.    Dasean Brow L Joory Gough, MD 06/18/11 1814 

## 2011-06-18 NOTE — ED Provider Notes (Signed)
History     CSN: [id number] Arrival date & time: 06/18/2011  9:08 AM   First MD Initiated Contact with Patient 06/18/11 1125      Chief Complaint  Patient presents with  . Pain    (Consider location/radiation/quality/duration/timing/severity/associated sxs/prior treatment) The history is provided by the patient.    Pt presents to the ED with complaints of pubic pain and leg pain. HE states that his leg feels tight and it hurts when he touches it. He is unsure as to how long he has been experiencing the pain but has only noticed it recently. Pt has a history of cellulitis to his left leg. The patient states that lately he noticed that he has been having pubic pain over the last couple of days. Denies urine odor and pain when urinating. Denies fevers, chills, nausea, vomiting, diarrhea, chest pains, SOB.  History reviewed. No pertinent past medical history.  History reviewed. No pertinent past surgical history.  No family history on file.  History  Substance Use Topics  . Smoking status: Current Everyday Smoker -- 1.0 packs/day  . Smokeless tobacco: Not on file  . Alcohol Use: No      Review of Systems  All other systems reviewed and are negative.    Allergies  Review of patient's allergies indicates no known allergies.  Home Medications  No current outpatient prescriptions on file.  BP 167/102  Pulse 89  Temp(Src) 98.5 F (36.9 C) (Oral)  Resp 20  Wt 500 lb (226.799 kg)  SpO2 92%  Physical Exam  Nursing note and vitals reviewed. Constitutional: He is oriented to person, place, and time. He appears well-developed and well-nourished.       Pt is morbidly obese > 500 pounds   HENT:  Head: Normocephalic and atraumatic.  Eyes: EOM are normal. Pupils are equal, round, and reactive to light.  Neck: Normal range of motion.  Cardiovascular: Normal rate and regular rhythm.   Pulmonary/Chest: Effort normal and breath sounds normal.  Abdominal: Soft. Bowel sounds  are normal. He exhibits no mass. There is no tenderness. There is no rebound and no guarding.  Musculoskeletal: Normal range of motion.  Neurological: He is alert and oriented to person, place, and time.  Skin: Skin is warm and dry. Rash (pt has  cellulitis to lower right leg, no fluctuance of evidence of abscessing.) noted.       ED Course  Procedures (including critical care time)  Labs Reviewed  URINALYSIS, ROUTINE W REFLEX MICROSCOPIC - Abnormal; Notable for the following:    APPearance CLOUDY (*)    Hgb urine dipstick SMALL (*)    Protein, ur >300 (*)    Urobilinogen, UA 2.0 (*)    All other components within normal limits  URINE MICROSCOPIC-ADD ON - Abnormal; Notable for the following:    Squamous Epithelial / LPF FEW (*)    Bacteria, UA FEW (*)    All other components within normal limits  URINE CULTURE   No results found.   No diagnosis found.    MDM  Pt is to be given 1 round of IV antibiotics in ED. Will be discharged with oral abx for UTI and Cellulitis. Cellulitis recheck in 48 hours pt to return to ED.        Dorthula Matas, PA 06/18/11 1259

## 2011-06-18 NOTE — ED Notes (Signed)
Pt states "been having pubic pain & swelling, also my right leg feels swollen but it isn't, feels really tight, sensitive to touch"

## 2011-06-18 NOTE — ED Notes (Signed)
Pt reports suprapubic pain and RLE pain x 2 weeks.  Pt denies any urinary sxs at this time.  Pt's RLE appears to be red, swollen and warm to touch.  Pt reports hx DVT x 3  Years ago.

## 2011-06-18 NOTE — ED Notes (Signed)
Pt's sats is 96% RA-tiffany EDPA at bedside .  Pt's parents reports that pt has concentrated O2 at home but pt refuses to use.  They also report that pt was supposed to have testing done for sleep apnea but refuses to go have it done as well.  Pt is A&Ox 4.

## 2011-06-18 NOTE — ED Notes (Signed)
Went in to give pt his d/c instructions.  AMy NT at bedside to obtain d/c vitals, pt's sats is 73% RA.  Tiffany EDPA notified.  Pt is placed on O2 2L [place].  Pt instructed to sit on the side of the bed, sats at 84%-88%.  Tiffany EDPA made aware.

## 2011-06-18 NOTE — ED Notes (Signed)
MD at bedside. 

## 2011-06-19 LAB — URINE CULTURE

## 2011-06-20 ENCOUNTER — Encounter (HOSPITAL_COMMUNITY): Payer: Self-pay | Admitting: *Deleted

## 2011-06-20 ENCOUNTER — Inpatient Hospital Stay (HOSPITAL_COMMUNITY)
Admission: EM | Admit: 2011-06-20 | Discharge: 2011-06-28 | DRG: 603 | Disposition: A | Discharge status: Home Health | Payer: Self-pay | Attending: Internal Medicine | Admitting: Internal Medicine

## 2011-06-20 DIAGNOSIS — I10 Essential (primary) hypertension: Secondary | ICD-10-CM | POA: Insufficient documentation

## 2011-06-20 DIAGNOSIS — E678 Other specified hyperalimentation: Secondary | ICD-10-CM | POA: Insufficient documentation

## 2011-06-20 DIAGNOSIS — E662 Morbid (severe) obesity with alveolar hypoventilation: Secondary | ICD-10-CM | POA: Diagnosis present

## 2011-06-20 DIAGNOSIS — N498 Inflammatory disorders of other specified male genital organs: Secondary | ICD-10-CM | POA: Diagnosis present

## 2011-06-20 DIAGNOSIS — L0390 Cellulitis, unspecified: Secondary | ICD-10-CM

## 2011-06-20 DIAGNOSIS — R609 Edema, unspecified: Secondary | ICD-10-CM

## 2011-06-20 DIAGNOSIS — G4733 Obstructive sleep apnea (adult) (pediatric): Secondary | ICD-10-CM | POA: Insufficient documentation

## 2011-06-20 DIAGNOSIS — L03119 Cellulitis of unspecified part of limb: Principal | ICD-10-CM | POA: Insufficient documentation

## 2011-06-20 DIAGNOSIS — F17200 Nicotine dependence, unspecified, uncomplicated: Secondary | ICD-10-CM | POA: Insufficient documentation

## 2011-06-20 DIAGNOSIS — L02419 Cutaneous abscess of limb, unspecified: Principal | ICD-10-CM | POA: Diagnosis present

## 2011-06-20 HISTORY — DX: Essential (primary) hypertension: I10

## 2011-06-20 HISTORY — DX: Obstructive sleep apnea (adult) (pediatric): G47.33

## 2011-06-20 HISTORY — DX: Sleep apnea, unspecified: G47.30

## 2011-06-20 HISTORY — DX: Panniculitis, unspecified: M79.3

## 2011-06-20 HISTORY — DX: Inflammatory disorders of scrotum: N49.2

## 2011-06-20 HISTORY — DX: Personal history of other diseases of the respiratory system: Z87.09

## 2011-06-20 HISTORY — DX: Morbid (severe) obesity with alveolar hypoventilation: E66.2

## 2011-06-20 HISTORY — DX: Cellulitis, unspecified: L03.90

## 2011-06-20 HISTORY — DX: Morbid (severe) obesity due to excess calories: E66.01

## 2011-06-20 HISTORY — DX: Heart failure, unspecified: I50.9

## 2011-06-20 HISTORY — DX: Anemia, unspecified: D64.9

## 2011-06-20 NOTE — ED Notes (Signed)
+   urine Patient given rx for keflex and doxycycline.

## 2011-06-20 NOTE — ED Notes (Signed)
Per EMS- pt in c/o cellulitis in bilateral lower legs and testicles x1 week

## 2011-06-21 ENCOUNTER — Emergency Department (HOSPITAL_COMMUNITY): Payer: Self-pay

## 2011-06-21 ENCOUNTER — Encounter (HOSPITAL_COMMUNITY): Payer: Self-pay | Admitting: Emergency Medicine

## 2011-06-21 DIAGNOSIS — L0390 Cellulitis, unspecified: Secondary | ICD-10-CM

## 2011-06-21 DIAGNOSIS — E662 Morbid (severe) obesity with alveolar hypoventilation: Secondary | ICD-10-CM

## 2011-06-21 DIAGNOSIS — L0291 Cutaneous abscess, unspecified: Secondary | ICD-10-CM

## 2011-06-21 DIAGNOSIS — G4710 Hypersomnia, unspecified: Secondary | ICD-10-CM

## 2011-06-21 DIAGNOSIS — G4730 Sleep apnea, unspecified: Secondary | ICD-10-CM

## 2011-06-21 LAB — DIFFERENTIAL
Basophils Relative: 0 % (ref 0–1)
Eosinophils Relative: 1 % (ref 0–5)
Lymphocytes Relative: 17 % (ref 12–46)
Monocytes Relative: 14 % — ABNORMAL HIGH (ref 3–12)
Neutrophils Relative %: 68 % (ref 43–77)

## 2011-06-21 LAB — TSH: TSH: 3.749 u[IU]/mL (ref 0.350–4.500)

## 2011-06-21 LAB — BASIC METABOLIC PANEL
CO2: 31 mEq/L (ref 19–32)
Glucose, Bld: 95 mg/dL (ref 70–99)
Potassium: 4.2 mEq/L (ref 3.5–5.1)
Sodium: 134 mEq/L — ABNORMAL LOW (ref 135–145)

## 2011-06-21 LAB — LACTATE DEHYDROGENASE: LDH: 384 U/L — ABNORMAL HIGH (ref 94–250)

## 2011-06-21 LAB — CBC
HCT: 48.3 % (ref 39.0–52.0)
Hemoglobin: 14.5 g/dL (ref 13.0–17.0)
MCV: 84.1 fL (ref 78.0–100.0)
Platelets: 202 10*3/uL (ref 150–400)
RBC: 5.7 MIL/uL (ref 4.22–5.81)
RBC: 5.74 MIL/uL (ref 4.22–5.81)
WBC: 13 10*3/uL — ABNORMAL HIGH (ref 4.0–10.5)

## 2011-06-21 LAB — LACTIC ACID, PLASMA: Lactic Acid, Venous: 1.2 mmol/L (ref 0.5–2.2)

## 2011-06-21 LAB — CREATININE, SERUM
GFR calc Af Amer: >90 mL/min (ref >90)
GFR calc non Af Amer: >90 mL/min (ref >90)

## 2011-06-21 MED ORDER — SODIUM CHLORIDE 0.45 % IV SOLN
INTRAVENOUS | Status: DC
  Administered 2011-06-21: 125 mL via INTRAVENOUS

## 2011-06-21 MED ORDER — VANCOMYCIN HCL IN DEXTROSE 1-5 GM/200ML-% IV SOLN
1000.0000 mg | Freq: Once | INTRAVENOUS | Status: AC
  Administered 2011-06-21: 1000 mg via INTRAVENOUS
  Filled 2011-06-21: qty 200

## 2011-06-21 MED ORDER — PIPERACILLIN-TAZOBACTAM 3.375 G IVPB
3.3750 g | Freq: Once | INTRAVENOUS | Status: AC
  Administered 2011-06-21: 3.375 g via INTRAVENOUS
  Filled 2011-06-21: qty 50

## 2011-06-21 MED ORDER — MORPHINE SULFATE 2 MG/ML IJ SOLN
INTRAMUSCULAR | Status: AC
  Administered 2011-06-21: 4 mg via INTRAVENOUS
  Filled 2011-06-21: qty 2

## 2011-06-21 MED ORDER — ENOXAPARIN SODIUM 120 MG/0.8ML ~~LOC~~ SOLN
110.0000 mg | SUBCUTANEOUS | Status: DC
Start: 2011-06-21 — End: 2011-06-28
  Administered 2011-06-21 – 2011-06-28 (×8): 110 mg via SUBCUTANEOUS
  Filled 2011-06-21 (×8): qty 0.8

## 2011-06-21 MED ORDER — PIPERACILLIN-TAZOBACTAM 3.375 G IVPB
3.3750 g | Freq: Three times a day (TID) | INTRAVENOUS | Status: DC
  Administered 2011-06-21 – 2011-06-26 (×14): 3.375 g via INTRAVENOUS
  Filled 2011-06-21 (×21): qty 50

## 2011-06-21 MED ORDER — MORPHINE SULFATE 4 MG/ML IJ SOLN: 4.0000 mg | Freq: Once | INTRAMUSCULAR | Status: DC

## 2011-06-21 MED ORDER — NON FORMULARY: 1.0000 | Status: DC | PRN

## 2011-06-21 MED ORDER — VANCOMYCIN HCL 1000 MG IV SOLR
1500.0000 mg | Freq: Two times a day (BID) | INTRAVENOUS | Status: DC
  Administered 2011-06-22 (×2): 1500 mg via INTRAVENOUS
  Filled 2011-06-21 (×5): qty 1500

## 2011-06-21 MED ORDER — ONDANSETRON HCL 4 MG/2ML IJ SOLN
4.0000 mg | Freq: Once | INTRAMUSCULAR | Status: AC
  Administered 2011-06-21: 4 mg via INTRAVENOUS
  Filled 2011-06-21: qty 2

## 2011-06-21 MED ORDER — SODIUM CHLORIDE 0.9 % IV SOLN
INTRAVENOUS | Status: DC
  Administered 2011-06-21: 14:00:00 via INTRAVENOUS
  Administered 2011-06-21: 150 mL/h via INTRAVENOUS
  Administered 2011-06-22: 1000 mL via INTRAVENOUS
  Administered 2011-06-22: 150 mL/h via INTRAVENOUS
  Administered 2011-06-22 – 2011-06-23 (×2): via INTRAVENOUS

## 2011-06-21 MED ORDER — ONDANSETRON HCL 4 MG/2ML IJ SOLN: 4.0000 mg | Freq: Once | INTRAMUSCULAR | Status: DC

## 2011-06-21 MED ORDER — PIPERACILLIN-TAZOBACTAM 3.375 G IVPB
3.3750 g | Freq: Once | INTRAVENOUS | Status: DC
  Filled 2011-06-21: qty 50

## 2011-06-21 MED ORDER — ALBUTEROL SULFATE (5 MG/ML) 0.5% IN NEBU
2.5000 mg | INHALATION_SOLUTION | RESPIRATORY_TRACT | Status: DC | PRN
  Administered 2011-06-21: 2.5 mg via RESPIRATORY_TRACT
  Filled 2011-06-21: qty 0.5

## 2011-06-21 NOTE — ED Notes (Signed)
Called 3E- RN unavailable at present, Bess Kinds will return call

## 2011-06-21 NOTE — H&P (Signed)
Andre [name] is an 30 y.o. male.   Chief Complaint: "Pain at my right leg and scrotum" HPI: Pt is a 30 y/o male with morbid obesity and h/o recurrent cellulitis that is presenting today to the ED c/o right leg and scrotal pain. Before today's presentation he had been evaluated at the ED two days prior for right leg discomfort and urinary frequency and urgency.  Was perscribed two oral antibiotics which he states that he took as indicated but presents today c/o worsening discomfort.  Denies any pyuria or hematuria and states that after taking the oral antibiotics for a couple of days his urgency and frequency has ameliorated.  Denies any fever, chills, nausea, abdominal discomfort, hematuria.  Past Medical History  Diagnosis Date  . Sleep apnea     History reviewed. No pertinent past surgical history.  History reviewed. No pertinent family history. Social History:  reports that he has been smoking.  He does not have any smokeless tobacco history on file. He reports that he does not drink alcohol or use illicit drugs.  Allergies: No Known Allergies  Medications Prior to Admission  Medication Dose Route Frequency Provider Last Rate Last Dose  . 0.45 % sodium chloride infusion   Intravenous STAT Cyndra Numbers, MD 125 mL/hr at 06/21/11 0825 125 mL at 06/21/11 0825  . morphine 2 MG/ML injection        4 mg at 06/21/11 0414  . morphine 4 MG/ML injection 4 mg  4 mg Intravenous Once Angus Seller, PA      . ondansetron [facility]) injection 4 mg  4 mg Intravenous Once Angus Seller, PA   4 mg at 06/21/11 0415  . piperacillin-tazobactam (ZOSYN) IVPB 3.375 g  3.375 g Intravenous Once Angus Seller, PA   3.375 g at 06/21/11 0415  . vancomycin (VANCOCIN) IVPB 1000 mg/200 mL premix  1,000 mg Intravenous Once Angus Seller, PA   1,000 mg at 06/21/11 0415   Medications Prior to Admission  Medication Sig Dispense Refill  . cephALEXin (KEFLEX) 500 MG capsule Take 1 capsule (500 mg total) by mouth 4  (four) times daily.  28 capsule  0  . doxycycline (VIBRAMYCIN) 100 MG capsule Take 1 capsule (100 mg total) by mouth 2 (two) times daily.  20 capsule  0    Results for orders placed during the hospital encounter of 06/20/11 (from the past 48 hour(s))  LACTATE DEHYDROGENASE     Status: Abnormal   Collection Time   06/21/11  1:08 AM      Component Value Range Comment   LD 384 (*) 94 - 250 (U/L) NO VISIBLE HEMOLYSIS  CBC     Status: Abnormal   Collection Time   06/21/11  1:08 AM      Component Value Range Comment   WBC 13.4 (*) 4.0 - 10.5 (K/uL)    RBC 5.70  4.22 - 5.81 (MIL/uL)    Hemoglobin 14.5  13.0 - 17.0 (g/dL)    HCT 16.1  09.6 - 04.5 (%)    MCV 83.7  78.0 - 100.0 (fL)    MCH 25.4 (*) 26.0 - 34.0 (pg)    MCHC 30.4  30.0 - 36.0 (g/dL)    RDW 40.9  81.1 - 91.4 (%)    Platelets 185  150 - 400 (K/uL)   DIFFERENTIAL     Status: Abnormal   Collection Time   06/21/11  1:08 AM      Component Value Range Comment  Neutrophils Relative 68  43 - 77 (%)    Lymphocytes Relative 17  12 - 46 (%)    Monocytes Relative 14 (*) 3 - 12 (%)    Eosinophils Relative 1  0 - 5 (%)    Basophils Relative 0  0 - 1 (%)    Neutro Abs 9.1 (*) 1.7 - 7.7 (K/uL)    Lymphs Abs 2.3  0.7 - 4.0 (K/uL)    Monocytes Absolute 1.9 (*) 0.1 - 1.0 (K/uL)    Eosinophils Absolute 0.1  0.0 - 0.7 (K/uL)    Basophils Absolute 0.0  0.0 - 0.1 (K/uL)    Smear Review MORPHOLOGY UNREMARKABLE     BASIC METABOLIC PANEL     Status: Abnormal   Collection Time   06/21/11  1:08 AM      Component Value Range Comment   Sodium 134 (*) 135 - 145 (mEq/L)    Potassium 4.2  3.5 - 5.1 (mEq/L)    Chloride 98  96 - 112 (mEq/L)    CO2 31  19 - 32 (mEq/L)    Glucose, Bld 95  70 - 99 (mg/dL)    BUN 11  6 - 23 (mg/dL)    Creatinine, Ser 1.61  0.50 - 1.35 (mg/dL)    Calcium 8.6  8.4 - 10.5 (mg/dL)    GFR calc non Af Amer >90  >90 (mL/min)    GFR calc Af Amer >90  >90 (mL/min)   LACTIC ACID, PLASMA     Status: Normal   Collection Time    06/21/11  6:10 AM      Component Value Range Comment   Lactic Acid, Venous 1.2  0.5 - 2.2 (mmol/L)    US Scrotum  06/21/2011  *RADIOLOGY REPORT*  Clinical Data: Scrotal swelling, with cellulitis.  Assess for soft tissue air.  ULTRASOUND OF SCROTUM  Technique:  Complete ultrasound examination of the testicles, epididymis, and other scrotal structures was performed.  Comparison:  Testicular ultrasound performed 01/17/2009  Findings:  There is marked scrotal soft tissue swelling, measuring up to 10 cm in thickness.  On correlation with prior studies and the clinical appearance, this is compatible with severe chronic or recurrent cellulitis, with worsened wall edema in comparison to the prior studies.  No definite reverberation artifact is seen to suggest free air. Scattered foci of very high echogenicity within the scrotal wall edema appear to demonstrate posterior acoustic shadowing, raising question for chronic soft tissue calcifications.  Some of these appear stable from prior studies.  The testicles are symmetric in size and echogenicity.  The right testis measures 5.0 x 3.7 x 3.3 cm, while the left testis measures 4.5 x 2.8 x 2.8 cm.  No testicular masses are seen, and there is no evidence of microlithiasis.  A large cyst is again noted in expected location of the right epididymis, measuring 5.0 x 4.8 x 4.8 cm.  This is mildly increased in size from prior studies.  The left epididymis is grossly unremarkable in appearance.  There is no evidence of hydrocele or varicocele.  IMPRESSION:  1.  Persistent marked scrotal soft tissue swelling, measuring up to 10 cm in thickness.  This has worsened in comparison to prior studies.  Given the clinical appearance, this is compatible with severe chronic or recurrent cellulitis. 2.  No definite evidence of free air.  Scattered foci of very high echogenicity within the scrotal wall edema appear to reflect chronic soft tissue calcifications.  Some of these appear stable from  prior studies. 3.  Large right epididymal cyst again noted, measuring 5.0 cm in size.  Original Report Authenticated By: Tonia Ghent, M.D.    Review of Systems  Constitutional: Negative.  Negative for fever, chills, weight loss, malaise/fatigue and diaphoresis.  HENT: Negative.  Negative for neck pain.   Eyes: Negative.   Respiratory: Negative.   Cardiovascular: Positive for leg swelling.  Gastrointestinal: Negative.   Genitourinary: Positive for frequency. Negative for dysuria, urgency, hematuria and flank pain.  Musculoskeletal: Positive for myalgias. Negative for back pain, joint pain and falls.  Skin: Negative for itching and rash.  Neurological: Negative.  Negative for weakness.  Endo/Heme/Allergies: Negative.   Psychiatric/Behavioral: Negative.     Blood pressure 134/72, pulse 120, temperature 97.3 F (36.3 C), temperature source Oral, resp. rate 12, SpO2 72.00%. Physical Exam  Constitutional: He is oriented to person, place, and time. He appears well-nourished.  HENT:  Head: Normocephalic and atraumatic.  Eyes: EOM are normal. Pupils are equal, round, and reactive to light. No scleral icterus.  Neck: Normal range of motion. Neck supple.  Cardiovascular: Normal rate and regular rhythm.   Respiratory: Effort normal and breath sounds normal.  GI: Soft. Bowel sounds are normal.  Neurological: He is alert and oriented to person, place, and time.  Skin: No bruising, no petechiae and no rash noted. Rash is not urticarial. There is erythema.          Pt has erythema which extends from his right leg to his ankle.  Also has erythema at anterior scrotum.  No fluctuance.  Induration at right leg  Psychiatric: He has a normal mood and affect. His behavior is normal.     Assessment/Plan 1) Cellulitis:  At this point will plan on continuing IV antibiotics zosyn and vanc.  Will plan on consulting urology given his presentation with scrotal edema and erythema.  [place] is negative for gas  and showing signs of skin changes related to chronic cellulitis nonetheless I would like a surgical evaluation.  Thus for now patient will be NPO and I will place the patient on IV fluids.  Will also obtain a UA, blood cultures, and will trend patients WBC's.  Pt has been afebrile but is presenting with elevated WBC at 13.4.   2) Morbid Obesity:  Will consider dietician consult  3) Obesity hypoventilation syndrome:  Will continue to monitor with pulse ox and will plan as above #2  4) OSA:  Pt mentions that he missed his sleep study, will have him f/u as an outpatient with his PCP.  Consider doing a sleep study while pt hospitalised.  5) Hypertension:  At this point pt is normotensive off of medication.  Will order a low salt diet.  6) Tobacco Abuse:  Pt not ready to quit at this point will plan on handing patient information on tobacco cessation.  Penny Pia 06/21/2011, 8:47 AM

## 2011-06-21 NOTE — ED Notes (Signed)
Nurse on 3E unavailable for report.

## 2011-06-21 NOTE — Consult Note (Signed)
Reason for Consult:  Scrotal Cellulitis Referring Physician: [facility]  Andre [name] is an 30 y.o. male.  HPI: Andre [name] has come to the ER for a one day history of scrotal tenderness.   He is morbidly obese and has chronic scrotal edema.   He has had prior cellulitis treated about 3 months ago.   His scrotal US shows no evidence of gas or abscess.   He has 10cm scrotal thickness from edema.   His testes were unremarkable but he had an epididymal cyst.   He has a low grade fever and an elevated WBC.   He is voiding without complaints.  Past Medical History  Diagnosis Date  . Sleep apnea   . Panniculitis     History of  . History of acute bronchitis with bronchospasm   . Cellulitis of scrotum     History of  . Anemia     iron deficient  . Hypertension   . CHF (congestive heart failure)   . Obstructive sleep apnea   . Obesity hypoventilation syndrome   . Morbid obesity   . Cellulitis     lower extremities    History reviewed. No pertinent past surgical history.  History reviewed. No pertinent family history.  Social History:  reports that he has been smoking.  He has never used smokeless tobacco. He reports that he does not drink alcohol or use illicit drugs.  Allergies: No Known Allergies  Medications: I have reviewed the patient's current medications.  Results for orders placed during the hospital encounter of 06/20/11 (from the past 48 hour(s))  LACTATE DEHYDROGENASE     Status: Abnormal   Collection Time   06/21/11  1:08 AM      Component Value Range Comment   LD 384 (*) 94 - 250 (U/L) NO VISIBLE HEMOLYSIS  CBC     Status: Abnormal   Collection Time   06/21/11  1:08 AM      Component Value Range Comment   WBC 13.4 (*) 4.0 - 10.5 (K/uL)    RBC 5.70  4.22 - 5.81 (MIL/uL)    Hemoglobin 14.5  13.0 - 17.0 (g/dL)    HCT 91.4  78.2 - 95.6 (%)    MCV 83.7  78.0 - 100.0 (fL)    MCH 25.4 (*) 26.0 - 34.0 (pg)    MCHC 30.4  30.0 - 36.0 (g/dL)    RDW 21.3   08.6 - 57.8 (%)    Platelets 185  150 - 400 (K/uL)   DIFFERENTIAL     Status: Abnormal   Collection Time   06/21/11  1:08 AM      Component Value Range Comment   Neutrophils Relative 68  43 - 77 (%)    Lymphocytes Relative 17  12 - 46 (%)    Monocytes Relative 14 (*) 3 - 12 (%)    Eosinophils Relative 1  0 - 5 (%)    Basophils Relative 0  0 - 1 (%)    Neutro Abs 9.1 (*) 1.7 - 7.7 (K/uL)    Lymphs Abs 2.3  0.7 - 4.0 (K/uL)    Monocytes Absolute 1.9 (*) 0.1 - 1.0 (K/uL)    Eosinophils Absolute 0.1  0.0 - 0.7 (K/uL)    Basophils Absolute 0.0  0.0 - 0.1 (K/uL)    Smear Review MORPHOLOGY UNREMARKABLE     BASIC METABOLIC PANEL     Status: Abnormal   Collection Time   06/21/11  1:08 AM  Component Value Range Comment   Sodium 134 (*) 135 - 145 (mEq/L)    Potassium 4.2  3.5 - 5.1 (mEq/L)    Chloride 98  96 - 112 (mEq/L)    CO2 31  19 - 32 (mEq/L)    Glucose, Bld 95  70 - 99 (mg/dL)    BUN 11  6 - 23 (mg/dL)    Creatinine, Ser 5.40  0.50 - 1.35 (mg/dL)    Calcium 8.6  8.4 - 10.5 (mg/dL)    GFR calc non Af Amer >90  >90 (mL/min)    GFR calc Af Amer >90  >90 (mL/min)   LACTIC ACID, PLASMA     Status: Normal   Collection Time   06/21/11  6:10 AM      Component Value Range Comment   Lactic Acid, Venous 1.2  0.5 - 2.2 (mmol/L)     US Scrotum  06/21/2011  *RADIOLOGY REPORT*  Clinical Data: Scrotal swelling, with cellulitis.  Assess for soft tissue air.  ULTRASOUND OF SCROTUM  Technique:  Complete ultrasound examination of the testicles, epididymis, and other scrotal structures was performed.  Comparison:  Testicular ultrasound performed 01/17/2009  Findings:  There is marked scrotal soft tissue swelling, measuring up to 10 cm in thickness.  On correlation with prior studies and the clinical appearance, this is compatible with severe chronic or recurrent cellulitis, with worsened wall edema in comparison to the prior studies.  No definite reverberation artifact is seen to suggest free air.  Scattered foci of very high echogenicity within the scrotal wall edema appear to demonstrate posterior acoustic shadowing, raising question for chronic soft tissue calcifications.  Some of these appear stable from prior studies.  The testicles are symmetric in size and echogenicity.  The right testis measures 5.0 x 3.7 x 3.3 cm, while the left testis measures 4.5 x 2.8 x 2.8 cm.  No testicular masses are seen, and there is no evidence of microlithiasis.  A large cyst is again noted in expected location of the right epididymis, measuring 5.0 x 4.8 x 4.8 cm.  This is mildly increased in size from prior studies.  The left epididymis is grossly unremarkable in appearance.  There is no evidence of hydrocele or varicocele.  IMPRESSION:  1.  Persistent marked scrotal soft tissue swelling, measuring up to 10 cm in thickness.  This has worsened in comparison to prior studies.  Given the clinical appearance, this is compatible with severe chronic or recurrent cellulitis. 2.  No definite evidence of free air.  Scattered foci of very high echogenicity within the scrotal wall edema appear to reflect chronic soft tissue calcifications.  Some of these appear stable from prior studies. 3.  Large right epididymal cyst again noted, measuring 5.0 cm in size.  Original Report Authenticated By: Andre [name], M.D.    Review of Systems  Genitourinary: Negative for dysuria, urgency and frequency.       He denies voiding complaints but has the chronic scrotal swelling the complaints noted above.   Blood pressure 135/71, pulse 109, temperature 99 F (37.2 C), temperature source Oral, resp. rate 12, SpO2 94.00%. Physical Exam  Constitutional: He appears well-developed. He does not appear ill.       Morbidly obese.  GI:       Morbid obesity with a large panniculus with chronic skin changes.  Genitourinary:       He has a massively swollen scrotum with some diffuse erythema and tenderness but no evidence of crepitus, induration  or fluctuance to suggest and abscess or fornier's.   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    Assessment/Plan: I have reviewed his scrotal US and performed an exam and he doesn't need surgical intervention for his scrotal cellulitis. I agree with admission for IV antibiotics. Elevation of the scrotum would be helpful, but likely difficult with his huge panniculus. Long term the only reasonable solution would be significant weight loss. Please reconsult as needed.  Shenia Alan J 06/21/2011, 10:23 AM

## 2011-06-21 NOTE — ED Notes (Signed)
Report called to [place] on 3E

## 2011-06-21 NOTE — ED Notes (Signed)
Placed in bariatric bed for comfort

## 2011-06-21 NOTE — Consult Note (Signed)
HISTORY of PRESENT ILLNESS:  Andre [name] is a 30 y.o. male, smoker, with a past medical history of sleep apnea, anemia, hypertension, lower extremity cellulitis, obesity hypoventilation syndrome admitted on 06/20/2011 with complaints of right leg and scrotal pain. He was seen 2 days prior to date of admission for right leg discomfort and urinary frequency/urgency. He was given Keflex and doxycycline however had worsening discomfort and presented to the [facility] long emergency department on 12/7 for evaluation.  He was admitted by [facility].  Ultrasound evaluation of scrotum demonstrated worsening of scrotal soft tissue swelling from previous studies c/w cellulitis.  Pulmonary critical care medicine consulted for obstructive sleep apnea evaluation.        Past Medical History  Diagnosis Date  . Sleep apnea   . Panniculitis     History of  . History of acute bronchitis with bronchospasm   . Cellulitis of scrotum     History of  . Anemia     iron deficient  . Hypertension   . CHF (congestive heart failure)   . Obstructive sleep apnea   . Obesity hypoventilation syndrome   . Morbid obesity   . Cellulitis     lower extremities    History reviewed. No pertinent past surgical history.  History reviewed. No pertinent family history.   reports that he has been smoking.  He has never used smokeless tobacco. He reports that he does not drink alcohol or use illicit drugs.  No Known Allergies  Medications Prior to Admission  Medication Dose Route Frequency Provider Last Rate Last Dose  . 0.9 %  sodium chloride infusion   Intravenous Continuous Orlando Vega 150 mL/hr at 06/21/11 1402    . albuterol (PROVENTIL) (5 MG/ML) 0.5% nebulizer solution 2.5 mg  2.5 mg Nebulization Q4H PRN Penny Pia      . enoxaparin (LOVENOX) injection 110 mg  110 mg Subcutaneous Q24H Orlando Vega   110 mg at 06/21/11 1402  . morphine 2 MG/ML injection        4 mg at 06/21/11 0414  . morphine 4  MG/ML injection 4 mg  4 mg Intravenous Once Angus Seller, PA      . ondansetron [facility]) injection 4 mg  4 mg Intravenous Once Angus Seller, PA   4 mg at 06/21/11 0415  . ondansetron (ZOFRAN) injection 4 mg  4 mg Intravenous Once [employer]      . piperacillin-tazobactam (ZOSYN) IVPB 3.375 g  3.375 g Intravenous Once Angus Seller, PA   3.375 g at 06/21/11 0415  . piperacillin-tazobactam (ZOSYN) IVPB 3.375 g  3.375 g Intravenous Q8H Orlando Vega      . vancomycin (VANCOCIN) 1,500 mg in sodium chloride 0.9 % 500 mL IVPB  1,500 mg Intravenous Q12H Orlando Vega      . vancomycin (VANCOCIN) IVPB 1000 mg/200 mL premix  1,000 mg Intravenous Once Angus Seller, PA   1,000 mg at 06/21/11 0415  . vancomycin (VANCOCIN) IVPB 1000 mg/200 mL premix  1,000 mg Intravenous Once Orlando Vega   1,000 mg at 06/21/11 1409  . DISCONTD: 0.45 % sodium chloride infusion   Intravenous STAT Cyndra Numbers, MD 125 mL/hr at 06/21/11 0825 125 mL at 06/21/11 0825  . DISCONTD: piperacillin-tazobactam (ZOSYN) IVPB 3.375 g  3.375 g Intravenous Once [employer]       Medications Prior to Admission  Medication Sig Dispense Refill  . cephALEXin (KEFLEX) 500 MG capsule Take 1 capsule (500 mg total) by  mouth 4 (four) times daily.  28 capsule  0  . doxycycline (VIBRAMYCIN) 100 MG capsule Take 1 capsule (100 mg total) by mouth 2 (two) times daily.  20 capsule  0    ROS: Limited ROS obtained since pt is sleepy Gen: Denies fever, chills, weight change, fatigue, night sweats HEENT: Denies blurred vision, double vision, hearing loss, tinnitus, sinus congestion, rhinorrhea, sore throat, neck stiffness, dysphagia PULM: Denies , cough, sputum production, hemoptysis, wheezing.   CV: Denies chest pain, edema, paroxysmal nocturnal dyspnea, palpitations GI: Denies abdominal pain, nausea, vomiting, diarrhea, hematochezia, melena, constipation, change in bowel habits GU: Denies dysuria, hematuria, polyuria, oliguria, urethral  discharge Endocrine: Denies hot or cold intolerance, polyuria, polyphagia or appetite change Heme: Denies easy bruising, bleeding, bleeding gums Neuro: Denies headache, numbness, weakness, slurred speech, loss of memory or consciousness   Blood pressure 164/85, pulse 106, temperature 98.6 F (37 C), temperature source Oral, resp. rate 20, height 5\' 10"  (1.778 m), weight 610 lb 0.2 oz (276.7 kg), SpO2 93.00%. PHYICAL EXAM: General: morbidly obese adult male in NAD Neuro: AAOx4, speech clear when aroused, sleepy but follows commands CV: s1s2 distant tones, rrr PULM:resp's even/non-labored, lungs bilaterally diminished ZO:XWRUE/AVWU, bsx4 active Extremities:  Warm/dry, RLE erythema,2 + edema  Skin- erythema over scrotum   LABS BMET    Component Value Date/Time   NA 134* 06/21/2011 0108   K 4.2 06/21/2011 0108   CL 98 06/21/2011 0108   CO2 31 06/21/2011 0108   GLUCOSE 95 06/21/2011 0108   BUN 11 06/21/2011 0108   CREATININE 0.73 06/21/2011 1120   CALCIUM 8.6 06/21/2011 0108   GFRNONAA >90 06/21/2011 1120   GFRAA >90 06/21/2011 1120    CBC    Component Value Date/Time   WBC 13.0* 06/21/2011 1120   RBC 5.74 06/21/2011 1120   HGB 14.2 06/21/2011 1120   HCT 48.3 06/21/2011 1120   PLT 202 06/21/2011 1120   MCV 84.1 06/21/2011 1120   MCH 24.7* 06/21/2011 1120   MCHC 29.4* 06/21/2011 1120   RDW 15.6* 06/21/2011 1120   LYMPHSABS 2.3 06/21/2011 0108   MONOABS 1.9* 06/21/2011 0108   EOSABS 0.1 06/21/2011 0108   BASOSABS 0.0 06/21/2011 0108   RADIOLOGIC DATA 12/7 No new films.   [place] of scrotum reviewed.     ASSESSMENT/PLAN:  OSA / OHS Assessment: Presumed obstructive sleep apnea/obesity hypoventilation syndrome in the setting of morbid obesity.   Plan: -Will need outpatient split-night sleep study and followup with sleep M.D. -AutoSet BiPAP during sleep (ordered) - DAY or NIGHT  Tobacco Abuse Assessment:  Continued current smoker, indicates he is not ready to quit smoking. Plan: -Tobacco  cessation counseling -has electronic cig  Scrotal Cellulitis Assessment: Plan: -Recommendations per [facility] -Low threshold for surgical input if worse  ALVA,RAKESH V.   06/21/2011

## 2011-06-21 NOTE — ED Provider Notes (Signed)
History     CSN: [id number] Arrival date & time: 06/20/2011  8:24 PM   First MD Initiated Contact with Patient 06/21/11 0100      Chief Complaint  Patient presents with  . Cellulitis    HPI  Hx provided by pt and family.  Pt returns to ED for complaints of acute onset of scrotal swelling as well as lower abd skin swelling that began earlier today.  Symptoms began acutely and have increased significantly since that time. Patient reports pain with pressure or palpation to the scrotum or abdomen. Pt reports being seen in ED 2 days ago. He reports being diagnosed with urinary tract infection and some skin infection that. He has been taking antibiotics as prescribed. Patient denies any subjective fevers, chills or sweats at home. Patient denies any nausea vomiting or diarrhea. he currently does not have a primary care provider. Patient does not report any no significant past medical history. Patient is a current smoker.    History reviewed. No pertinent past medical history.  History reviewed. No pertinent past surgical history.  History reviewed. No pertinent family history.  History  Substance Use Topics  . Smoking status: Current Everyday Smoker -- 1.0 packs/day  . Smokeless tobacco: Not on file  . Alcohol Use: No      Review of Systems  Constitutional: Negative for fever and chills.  Gastrointestinal: Positive for abdominal pain. Negative for nausea, vomiting, diarrhea and constipation.  Genitourinary: Positive for scrotal swelling and testicular pain. Negative for dysuria, frequency, hematuria, penile swelling and penile pain.  Neurological: Negative for dizziness, light-headedness and headaches.    Allergies  Review of patient's allergies indicates no known allergies.  Home Medications   Current Outpatient Rx  Name Route Sig Dispense Refill  . CEPHALEXIN 500 MG PO CAPS Oral Take 1 capsule (500 mg total) by mouth 4 (four) times daily. 28 capsule 0  . DOXYCYCLINE  HYCLATE 100 MG PO CAPS Oral Take 1 capsule (100 mg total) by mouth 2 (two) times daily. 20 capsule 0  . HYDROCORTISONE 1 % EX CREA  Apply to affected area 2 times daily 15 g 0  . OXYCODONE-ACETAMINOPHEN 5-325 MG PO TABS Oral Take 1 tablet by mouth every 6 (six) hours as needed for pain. 12 tablet 0    BP 158/81  Pulse 99  Temp(Src) 98.9 F (37.2 C) (Oral)  Resp 18  SpO2 93%  Physical Exam  Nursing note and vitals reviewed. Constitutional: He is oriented to person, place, and time. He appears well-developed and well-nourished. No distress.       Morbidly obese  HENT:  Head: Normocephalic.  Cardiovascular: Normal rate, regular rhythm and normal heart sounds.   Pulmonary/Chest: Effort normal. No respiratory distress. He has wheezes. He has no rales.  Abdominal: Soft. There is tenderness. There is no rebound and no guarding.       See skin exam.  Genitourinary:       See also skin exam. Large amount of scrotal swelling with induration and mild erythema of skin.  No crepitus.  Unable to evaluate penis secondary to body habitus and scrotal swelling.    Neurological: He is alert and oriented to person, place, and time.  Skin: Skin is warm.       Induration of panus and scrotum with swelling and mild erythema.  No crepitus of skin.  Psychiatric: He has a normal mood and affect. His behavior is normal.    ED Course  Procedures (including critical care  time)  Labs Reviewed  LACTATE DEHYDROGENASE - Abnormal; Notable for the following:    LD 384 (*) NO VISIBLE HEMOLYSIS   All other components within normal limits  CBC - Abnormal; Notable for the following:    WBC 13.4 (*)    MCH 25.4 (*)    All other components within normal limits  DIFFERENTIAL - Abnormal; Notable for the following:    Monocytes Relative 14 (*)    Neutro Abs 9.1 (*)    Monocytes Absolute 1.9 (*)    All other components within normal limits  BASIC METABOLIC PANEL - Abnormal; Notable for the following:    Sodium 134  (*)    All other components within normal limits  CULTURE, BLOOD (ROUTINE X 2)  CULTURE, BLOOD (ROUTINE X 2)  LACTIC ACID, PLASMA   Results for orders placed during the hospital encounter of 06/20/11  LACTATE DEHYDROGENASE      Component Value Range   LD 384 (*) 94 - 250 (U/L)  CBC      Component Value Range   WBC 13.4 (*) 4.0 - 10.5 (K/uL)   RBC 5.70  4.22 - 5.81 (MIL/uL)   Hemoglobin 14.5  13.0 - 17.0 (g/dL)   HCT 96.0  45.4 - 09.8 (%)   MCV 83.7  78.0 - 100.0 (fL)   MCH 25.4 (*) 26.0 - 34.0 (pg)   MCHC 30.4  30.0 - 36.0 (g/dL)   RDW 11.9  14.7 - 82.9 (%)   Platelets 185  150 - 400 (K/uL)  DIFFERENTIAL      Component Value Range   Neutrophils Relative 68  43 - 77 (%)   Lymphocytes Relative 17  12 - 46 (%)   Monocytes Relative 14 (*) 3 - 12 (%)   Eosinophils Relative 1  0 - 5 (%)   Basophils Relative 0  0 - 1 (%)   Neutro Abs 9.1 (*) 1.7 - 7.7 (K/uL)   Lymphs Abs 2.3  0.7 - 4.0 (K/uL)   Monocytes Absolute 1.9 (*) 0.1 - 1.0 (K/uL)   Eosinophils Absolute 0.1  0.0 - 0.7 (K/uL)   Basophils Absolute 0.0  0.0 - 0.1 (K/uL)   Smear Review MORPHOLOGY UNREMARKABLE    BASIC METABOLIC PANEL      Component Value Range   Sodium 134 (*) 135 - 145 (mEq/L)   Potassium 4.2  3.5 - 5.1 (mEq/L)   Chloride 98  96 - 112 (mEq/L)   CO2 31  19 - 32 (mEq/L)   Glucose, Bld 95  70 - 99 (mg/dL)   BUN 11  6 - 23 (mg/dL)   Creatinine, Ser 5.62  0.50 - 1.35 (mg/dL)   Calcium 8.6  8.4 - 13.0 (mg/dL)   GFR calc non Af Amer >90  >90 (mL/min)   GFR calc Af Amer >90  >90 (mL/min)     US Scrotum  06/21/2011  *RADIOLOGY REPORT*  Clinical Data: Scrotal swelling, with cellulitis.  Assess for soft tissue air.  ULTRASOUND OF SCROTUM  Technique:  Complete ultrasound examination of the testicles, epididymis, and other scrotal structures was performed.  Comparison:  Testicular ultrasound performed 01/17/2009  Findings:  There is marked scrotal soft tissue swelling, measuring up to 10 cm in thickness.  On  correlation with prior studies and the clinical appearance, this is compatible with severe chronic or recurrent cellulitis, with worsened wall edema in comparison to the prior studies.  No definite reverberation artifact is seen to suggest free air. Scattered foci of very  high echogenicity within the scrotal wall edema appear to demonstrate posterior acoustic shadowing, raising question for chronic soft tissue calcifications.  Some of these appear stable from prior studies.  The testicles are symmetric in size and echogenicity.  The right testis measures 5.0 x 3.7 x 3.3 cm, while the left testis measures 4.5 x 2.8 x 2.8 cm.  No testicular masses are seen, and there is no evidence of microlithiasis.  A large cyst is again noted in expected location of the right epididymis, measuring 5.0 x 4.8 x 4.8 cm.  This is mildly increased in size from prior studies.  The left epididymis is grossly unremarkable in appearance.  There is no evidence of hydrocele or varicocele.  IMPRESSION:  1.  Persistent marked scrotal soft tissue swelling, measuring up to 10 cm in thickness.  This has worsened in comparison to prior studies.  Given the clinical appearance, this is compatible with severe chronic or recurrent cellulitis. 2.  No definite evidence of free air.  Scattered foci of very high echogenicity within the scrotal wall edema appear to reflect chronic soft tissue calcifications.  Some of these appear stable from prior studies. 3.  Large right epididymal cyst again noted, measuring 5.0 cm in size.  Original Report Authenticated By: Tonia Ghent, M.D.     1. Cellulitis       MDM  1:00am  Pt seen and evaluated.  Pt in no acute distress.  Pt non-toxic appearing.   Patient continues to do well. Labs show a slightly elevated WBC. IV antibiotics started. Spoke with Attending Physician, will plan for admission.  Spoke with [facility] they will see patient and admit.  Phill Mutter [place], [place] 06/21/11 [phone]

## 2011-06-21 NOTE — ED Provider Notes (Signed)
Medical screening examination/treatment/procedure(s) were performed by non-physician practitioner and as supervising physician I was immediately available for consultation/collaboration.  Jasmine Awe, MD 06/21/11 [phone]

## 2011-06-21 NOTE — ED Notes (Signed)
Ultrasound at bedside

## 2011-06-21 NOTE — Progress Notes (Signed)
ANTIBIOTIC CONSULT NOTE - INITIAL  Pharmacy Consult for Vancomycin/Zosyn Indication: scrotal cellulitis  No Known Allergies  Patient Measurements: Height: 5\' 10"  (177.8 cm) Weight: 610 lb 0.2 oz (276.7 kg) IBW/kg (Calculated) : 73   Vital Signs: Temp: 97 F (36.1 C) (12/07 1353) Temp src: Oral (12/07 1353) BP: 167/101 mmHg (12/07 1353) Pulse Rate: 108  (12/07 1353)    Labs:  Basename 06/21/11 1120 06/21/11 0108  WBC 13.0* 13.4*  HGB 14.2 14.5  PLT 202 185  LABCREA -- --  CREATININE 0.73 0.63   Estimated Creatinine Clearance: 292.4 ml/min (by C-G formula based on Cr of 0.73). No results found for this basename: VANCOTROUGH:2,VANCOPEAK:2,VANCORANDOM:2,GENTTROUGH:2,GENTPEAK:2,GENTRANDOM:2,TOBRATROUGH:2,TOBRAPEAK:2,TOBRARND:2,AMIKACINPEAK:2,AMIKACINTROU:2,AMIKACIN:2, in the last 72 hours   Microbiology: Recent Results (from the past 720 hour(s))  URINE CULTURE     Status: Normal   Collection Time   06/18/11 12:09 PM      Component Value Range Status Comment   Specimen Description URINE, RANDOM   Final    Special Requests NONE   Final    Setup Time [id number]   Final    Colony Count >=100,000 COLONIES/ML   Final    Culture     Final    Value: GROUP B STREP(S.AGALACTIAE)ISOLATED     Note: TESTING AGAINST S. AGALACTIAE NOT ROUTINELY PERFORMED DUE TO PREDICTABILITY OF AMP/PEN/VAN SUSCEPTIBILITY.   Report Status 06/19/2011 FINAL   Final     Medical History: Past Medical History  Diagnosis Date  . Sleep apnea   . Panniculitis     History of  . History of acute bronchitis with bronchospasm   . Cellulitis of scrotum     History of  . Anemia     iron deficient  . Hypertension   . CHF (congestive heart failure)   . Obstructive sleep apnea   . Obesity hypoventilation syndrome   . Morbid obesity   . Cellulitis     lower extremities    Medications:  Scheduled:    . enoxaparin  110 mg Subcutaneous Q24H  . morphine      .  morphine injection  4 mg Intravenous  Once  . ondansetron (ZOFRAN) IV  4 mg Intravenous Once  . ondansetron (ZOFRAN) IV  4 mg Intravenous Once  . piperacillin-tazobactam (ZOSYN)  IV  3.375 g Intravenous Once  . piperacillin-tazobactam (ZOSYN)  IV  3.375 g Intravenous Once  . vancomycin  1,000 mg Intravenous Once  . vancomycin  1,000 mg Intravenous Once  . DISCONTD: sodium chloride   Intravenous STAT   Infusions:    . sodium chloride 150 mL/hr at 06/21/11 1402   Assessment: 30 yo male to be transferred to ICU with scrotal edema and cellulitis. Note patient has had prior cellulitis that was treated about 3 months ago  Goal of Therapy:  Vancomycin trough level 10-15 mcg/ml  Plan:  1. Vancomycin 1g was given today at 0400 and again at 1400. Per protocol, will start patient on 1500mg  IV q12 thereafter and check a trough at steady state as needed 2. Zosyn 3.375g IV given x 1 at 0400 and the 1400 dose that was ordered has not yet been given per nursing charting. Will start Zosyn 3.375g IV q8 (extended interval infusion) thereafter and adjust dose based on renal function if needed     Berkley Harvey 06/21/2011,2:37 PM

## 2011-06-21 NOTE — ED Notes (Signed)
Pt here with c/o testicle pain that started on Wednesday. Pt and family said he has had this before and was dx with cellulitis. Testicles are painful to touch and very swollen. Rates pain 1/10 at rest and 7/10 when walking or sitting up.

## 2011-06-21 NOTE — Progress Notes (Signed)
Per MD order- RT placed PT on Auto Titrate CPAP (Max 20 cm h20 / Min 8 cm H20 with 5 LPM- nasal mask ). PT tolerated well at this time- RN aware PT on CPAP.

## 2011-06-22 DIAGNOSIS — E662 Morbid (severe) obesity with alveolar hypoventilation: Secondary | ICD-10-CM

## 2011-06-22 DIAGNOSIS — L0291 Cutaneous abscess, unspecified: Secondary | ICD-10-CM

## 2011-06-22 DIAGNOSIS — G4710 Hypersomnia, unspecified: Secondary | ICD-10-CM

## 2011-06-22 DIAGNOSIS — G4730 Sleep apnea, unspecified: Secondary | ICD-10-CM

## 2011-06-22 DIAGNOSIS — L0390 Cellulitis, unspecified: Secondary | ICD-10-CM

## 2011-06-22 LAB — CBC
HCT: 45.2 % (ref 39.0–52.0)
Hemoglobin: 12.9 g/dL — ABNORMAL LOW (ref 13.0–17.0)
MCHC: 28.5 g/dL — ABNORMAL LOW (ref 30.0–36.0)
MCV: 86.1 fL (ref 78.0–100.0)
RDW: 15.7 % — ABNORMAL HIGH (ref 11.5–15.5)
WBC: 12.4 10*3/uL — ABNORMAL HIGH (ref 4.0–10.5)

## 2011-06-22 LAB — BASIC METABOLIC PANEL
BUN: 9 mg/dL (ref 6–23)
CO2: 33 mEq/L — ABNORMAL HIGH (ref 19–32)
Chloride: 98 mEq/L (ref 96–112)
Creatinine, Ser: 0.66 mg/dL (ref 0.50–1.35)
GFR calc Af Amer: >90 mL/min (ref >90)
Potassium: 4.7 mEq/L (ref 3.5–5.1)

## 2011-06-22 MED ORDER — VANCOMYCIN HCL 1000 MG IV SOLR
1500.0000 mg | Freq: Three times a day (TID) | INTRAVENOUS | Status: DC
  Administered 2011-06-23 – 2011-06-24 (×5): 1500 mg via INTRAVENOUS
  Filled 2011-06-22 (×11): qty 1500

## 2011-06-22 NOTE — Plan of Care (Signed)
Problem: ICU Phase Progression Outcomes Goal: Initial discharge plan identified Outcome: Completed/Met Date Met:  06/22/11 Pt. Will be d/c'd to home

## 2011-06-22 NOTE — Progress Notes (Signed)
CC: Scrotal Cullitis - Consult follow-up  Subjective: Pt transferred to step-down for oxygenation issues. No GU complaints. No fevers.  Objective: Vital signs in last 24 hours: Temp:  [97 F (36.1 C)-99 F (37.2 C)] 97.8 F (36.6 C) (12/08 0800) Pulse Rate:  [87-110] 90  (12/08 0800) Resp:  [10-25] 18  (12/08 0800) BP: (128-167)/(57-101) 151/76 mmHg (12/08 0800) SpO2:  [86 %-96 %] 93 % (12/08 0800) FiO2 (%):  [40 %] 40 % (12/08 0000) Weight:  [275.8 kg (608 lb 0.5 oz)-276.7 kg (610 lb 0.2 oz)] 608 lb 0.5 oz (275.8 kg) (12/08 0045) Last BM Date: 06/20/11  Intake/Output from previous day: 12/07 0701 - 12/08 0700 In: 3750 [P.O.:600; I.V.:2550; IV Piggyback:600] Out: -  Intake/Output this shift: Total I/O In: 150 [I.V.:150] Out: -   General appearance: alert and morbidly obese Head: Normocephalic, without obvious abnormality, atraumatic Ears: normal TM's and external ear canals both ears GI: Morbidly obese abdomen with mild panniculits. No crepitus lower abdomen. Male genitalia: normal, Completely burried penis. Massive scrotal edema w/o crepitus, induration, calor, dolor. No pernieal / rectal indurration / lesions. Extremities: edema (massive with woody changes to LE) Skin: Skin color, texture, turgor normal. No rashes or lesions or Stigamata of edema and venous stasis. No s/s fasciitis.  Lab Results:   Basename 06/22/11 0309 06/21/11 1120  WBC 12.4* 13.0*  HGB 12.9* 14.2  HCT 45.2 48.3  PLT 183 202   BMET  Basename 06/22/11 0309 06/21/11 1120 06/21/11 0108  NA 135 -- 134*  K 4.7 -- 4.2  CL 98 -- 98  CO2 33* -- 31  GLUCOSE 107* -- 95  BUN 9 -- 11  CREATININE 0.66 0.73 --  CALCIUM 8.5 -- 8.6   PT/INR No results found for this basename: LABPROT:2,INR:2 in the last 72 hours ABG No results found for this basename: PHART:2,PCO2:2,PO2:2,HCO3:2 in the last 72 hours  Studies/Results: US Scrotum  06/21/2011  *RADIOLOGY REPORT*  Clinical Data: Scrotal swelling,  with cellulitis.  Assess for soft tissue air.  ULTRASOUND OF SCROTUM  Technique:  Complete ultrasound examination of the testicles, epididymis, and other scrotal structures was performed.  Comparison:  Testicular ultrasound performed 01/17/2009  Findings:  There is marked scrotal soft tissue swelling, measuring up to 10 cm in thickness.  On correlation with prior studies and the clinical appearance, this is compatible with severe chronic or recurrent cellulitis, with worsened wall edema in comparison to the prior studies.  No definite reverberation artifact is seen to suggest free air. Scattered foci of very high echogenicity within the scrotal wall edema appear to demonstrate posterior acoustic shadowing, raising question for chronic soft tissue calcifications.  Some of these appear stable from prior studies.  The testicles are symmetric in size and echogenicity.  The right testis measures 5.0 x 3.7 x 3.3 cm, while the left testis measures 4.5 x 2.8 x 2.8 cm.  No testicular masses are seen, and there is no evidence of microlithiasis.  A large cyst is again noted in expected location of the right epididymis, measuring 5.0 x 4.8 x 4.8 cm.  This is mildly increased in size from prior studies.  The left epididymis is grossly unremarkable in appearance.  There is no evidence of hydrocele or varicocele.  IMPRESSION:  1.  Persistent marked scrotal soft tissue swelling, measuring up to 10 cm in thickness.  This has worsened in comparison to prior studies.  Given the clinical appearance, this is compatible with severe chronic or recurrent cellulitis. 2.  No definite evidence of free air.  Scattered foci of very high echogenicity within the scrotal wall edema appear to reflect chronic soft tissue calcifications.  Some of these appear stable from prior studies. 3.  Large right epididymal cyst again noted, measuring 5.0 cm in size.  Original Report Authenticated By: Tonia Ghent, M.D.    Anti-infectives: Anti-infectives       Start     Dose/Rate Route Frequency Ordered Stop   06/21/11 2359   vancomycin (VANCOCIN) 1,500 mg in sodium chloride 0.9 % 500 mL IVPB        1,500 mg 250 mL/hr over 120 Minutes Intravenous Every 12 hours 06/21/11 1445     06/21/11 1600   piperacillin-tazobactam (ZOSYN) IVPB 3.375 g        3.375 g 12.5 mL/hr over 240 Minutes Intravenous Every 8 hours 06/21/11 1445     06/21/11 1400   piperacillin-tazobactam (ZOSYN) IVPB 3.375 g  Status:  Discontinued        3.375 g 12.5 mL/hr over 240 Minutes Intravenous  Once 06/21/11 1245 06/21/11 1443   06/21/11 1330   vancomycin (VANCOCIN) IVPB 1000 mg/200 mL premix        1,000 mg 200 mL/hr over 60 Minutes Intravenous  Once 06/21/11 1245 06/21/11 1509   06/21/11 0230   vancomycin (VANCOCIN) IVPB 1000 mg/200 mL premix        1,000 mg 200 mL/hr over 60 Minutes Intravenous  Once 06/21/11 0221 06/21/11 0515   06/21/11 0230   piperacillin-tazobactam (ZOSYN) IVPB 3.375 g        3.375 g 12.5 mL/hr over 240 Minutes Intravenous  Once 06/21/11 0221 06/21/11 0815          Assessment/Plan: 1 - Scrotal Cellulitis - exam unimpressive for significant current scrotal cellulitis or other deep or soft tissue infection. Infectious parameters improving (leukocytosis, fevers).    2 - Will sign off. Please call with any questions.   LOS: 2 days    [facility], Ceili Boshers 06/22/2011

## 2011-06-22 NOTE — Progress Notes (Addendum)
Subjective: Pt mentions that he is feeling better today although he is still experiencing some scrotal and leg discomfort.  States that he has an "oxygen machine" at home.  Which he only uses if he feels dizzy.  States that as an outpatient he was involved in a program which helped him loose weight and he successfully lost 70 lbs.  Currently denies any fever or chills.  Objective: Filed Vitals:   06/22/11 0137 06/22/11 0200 06/22/11 0300 06/22/11 0400  BP: 153/88 152/83 128/60 156/81  Pulse: 91 99 89 87  Temp:      TempSrc:      Resp: 19 10 18    Height:      Weight:      SpO2: 93% 86% 92% 95%   Weight change:   Intake/Output Summary (Last 24 hours) at 06/22/11 0744 Last data filed at 06/22/11 0500  Gross per 24 hour  Intake   3450 ml  Output      0 ml  Net   3450 ml    General: Alert, awake, oriented x3, in no acute distress.  HEENT: No bruits, no goiter.  Heart: Regular rate and rhythm, without murmurs, rubs, gallops.  Lungs: no rhales, lungs CTA Abdomen: Soft, nontender, nondistended, positive bowel sounds. Large panus Neuro: Grossly intact, nonfocal. Skin:  Pt has cellulits at anterior scrotum and right leg.  Erythema encompases most of right leg.  Painful to the touch.  No fluctuance   Lab Results:  Basename 06/22/11 0309 06/21/11 1120 06/21/11 0108  NA 135 -- 134*  K 4.7 -- 4.2  CL 98 -- 98  CO2 33* -- 31  GLUCOSE 107* -- 95  BUN 9 -- 11  CREATININE 0.66 0.73 --  CALCIUM 8.5 -- 8.6  MG -- -- --  PHOS -- -- --   No results found for this basename: AST:2,ALT:2,ALKPHOS:2,BILITOT:2,PROT:2,ALBUMIN:2 in the last 72 hours No results found for this basename: LIPASE:2,AMYLASE:2 in the last 72 hours  Basename 06/22/11 0309 06/21/11 1120 06/21/11 0108  WBC 12.4* 13.0* --  NEUTROABS -- -- 9.1*  HGB 12.9* 14.2 --  HCT 45.2 48.3 --  MCV 86.1 84.1 --  PLT 183 202 --   No results found for this basename: CKTOTAL:3,CKMB:3,CKMBINDEX:3,TROPONINI:3 in the last 72 hours No  results found for this basename: POCBNP:3 in the last 72 hours No results found for this basename: DDIMER:2 in the last 72 hours No results found for this basename: HGBA1C:2 in the last 72 hours No results found for this basename: CHOL:2,HDL:2,LDLCALC:2,TRIG:2,CHOLHDL:2,LDLDIRECT:2 in the last 72 hours  Basename 06/21/11 1120  TSH 3.749  T4TOTAL --  T3FREE --  THYROIDAB --   No results found for this basename: VITAMINB12:2,FOLATE:2,FERRITIN:2,TIBC:2,IRON:2,RETICCTPCT:2 in the last 72 hours  Micro Results: Recent Results (from the past 240 hour(s))  URINE CULTURE     Status: Normal   Collection Time   06/18/11 12:09 PM      Component Value Range Status Comment   Specimen Description URINE, RANDOM   Final    Special Requests NONE   Final    Setup Time [id number]   Final    Colony Count >=100,000 COLONIES/ML   Final    Culture     Final    Value: GROUP B STREP(S.AGALACTIAE)ISOLATED     Note: TESTING AGAINST S. AGALACTIAE NOT ROUTINELY PERFORMED DUE TO PREDICTABILITY OF AMP/PEN/VAN SUSCEPTIBILITY.   Report Status 06/19/2011 FINAL   Final   MRSA PCR SCREENING     Status: Normal   Collection  Time   06/21/11  4:33 PM      Component Value Range Status Comment   MRSA by PCR NEGATIVE  NEGATIVE  Final     Studies/Results: US Scrotum  06/21/2011  *RADIOLOGY REPORT*  Clinical Data: Scrotal swelling, with cellulitis.  Assess for soft tissue air.  ULTRASOUND OF SCROTUM  Technique:  Complete ultrasound examination of the testicles, epididymis, and other scrotal structures was performed.  Comparison:  Testicular ultrasound performed 01/17/2009  Findings:  There is marked scrotal soft tissue swelling, measuring up to 10 cm in thickness.  On correlation with prior studies and the clinical appearance, this is compatible with severe chronic or recurrent cellulitis, with worsened wall edema in comparison to the prior studies.  No definite reverberation artifact is seen to suggest free air. Scattered  foci of very high echogenicity within the scrotal wall edema appear to demonstrate posterior acoustic shadowing, raising question for chronic soft tissue calcifications.  Some of these appear stable from prior studies.  The testicles are symmetric in size and echogenicity.  The right testis measures 5.0 x 3.7 x 3.3 cm, while the left testis measures 4.5 x 2.8 x 2.8 cm.  No testicular masses are seen, and there is no evidence of microlithiasis.  A large cyst is again noted in expected location of the right epididymis, measuring 5.0 x 4.8 x 4.8 cm.  This is mildly increased in size from prior studies.  The left epididymis is grossly unremarkable in appearance.  There is no evidence of hydrocele or varicocele.  IMPRESSION:  1.  Persistent marked scrotal soft tissue swelling, measuring up to 10 cm in thickness.  This has worsened in comparison to prior studies.  Given the clinical appearance, this is compatible with severe chronic or recurrent cellulitis. 2.  No definite evidence of free air.  Scattered foci of very high echogenicity within the scrotal wall edema appear to reflect chronic soft tissue calcifications.  Some of these appear stable from prior studies. 3.  Large right epididymal cyst again noted, measuring 5.0 cm in size.  Original Report Authenticated By: Tonia Ghent, M.D.    Medications: I have reviewed the patient's current medications.   Patient Active Hospital Problem List  Scrotal/leg cellulitis:  At this point WBC is trending down.  There hasn't been afebrile within the last 24 hrs with his t max at 37.2.  Patient's leg and scrotum is less painful to the touch and is showing some improvement.  I will f/u with pts blood cultures and plan on continuing IV abx zosyn and vanc.  Plan will be to give him several days of IV abx and switch him to oral abx, will consider doing so after wbc are WNL.  Obesity:  Will plan on ordering a dietician consult.  Have discussed with the patient and he is  agreeable.  Also will d/c social worker to help patient find resources for when he is d/c'd  OSA/Obesity hypoventilation syndrome:  Pulm has evaluated patient and made certain recommendations.  I appreciate their input.  Will plan on recommending f/u as outpatient.  While here we will place patient on supplemental O2 as needed to keep sats at or above 90% and while here cpap at night.  Upon d/c will have social and case manager help obtain durable medical equipment should the patient require it.  Tobacco Use:  Will recommend tobacco cessation.    LOS: 2 days   Penny Pia M.D.  [facility] 06/22/2011, 7:44 AM  Hypertension:  At this point most likely related to OSA.  Per nursing patient has been uncompliant with cpap.  I have discussed this with the patient.  Will continue to monitor blood pressures.  Pt currently assymptomatic.

## 2011-06-22 NOTE — Progress Notes (Signed)
HISTORY of PRESENT ILLNESS:  Andre [name] is a 30 y.o. male, smoker, with a past medical history of sleep apnea, anemia, hypertension, lower extremity cellulitis, obesity hypoventilation syndrome admitted on 06/20/2011 with complaints of right leg and scrotal pain. He was seen 2 days prior to date of admission for right leg discomfort and urinary frequency/urgency. He was given Keflex and doxycycline however had worsening discomfort and presented to the [facility] emergency department on 12/7 for evaluation.  He was admitted by [facility].  Ultrasound evaluation of scrotum demonstrated worsening of scrotal soft tissue swelling from previous studies c/w cellulitis.  Pulmonary critical care medicine consulted for obstructive sleep apnea evaluation.    Subjective:  Pt with difficulty tolerating CPAP overnight, Wore O2 but took the mask off  Blood pressure 157/79, pulse 84, temperature 97.8 F (36.6 C), temperature source Oral, resp. rate 22, height 5\' 10"  (1.778 m), weight 275.8 kg (608 lb 0.5 oz), SpO2 98.00%. PHYICAL EXAM: General: morbidly obese adult male in NAD Neuro: AAOx4, speech clear when aroused, sleepy but follows commands CV: s1s2 distant tones, rrr PULM:resp's even/non-labored, lungs bilaterally diminished AO:ZHYQM/VHQI, bsx4 active Extremities:  Warm/dry, RLE erythema,2 + edema  Skin- erythema over scrotum   LABS BMET    Component Value Date/Time   NA 135 06/22/2011 0309   K 4.7 06/22/2011 0309   CL 98 06/22/2011 0309   CO2 33* 06/22/2011 0309   GLUCOSE 107* 06/22/2011 0309   BUN 9 06/22/2011 0309   CREATININE 0.66 06/22/2011 0309   CALCIUM 8.5 06/22/2011 0309   GFRNONAA >90 06/22/2011 0309   GFRAA >90 06/22/2011 0309    CBC    Component Value Date/Time   WBC 12.4* 06/22/2011 0309   RBC 5.25 06/22/2011 0309   HGB 12.9* 06/22/2011 0309   HCT 45.2 06/22/2011 0309   PLT 183 06/22/2011 0309   MCV 86.1 06/22/2011 0309   MCH 24.6* 06/22/2011 0309   MCHC 28.5* 06/22/2011  0309   RDW 15.7* 06/22/2011 0309   LYMPHSABS 2.3 06/21/2011 0108   MONOABS 1.9* 06/21/2011 0108   EOSABS 0.1 06/21/2011 0108   BASOSABS 0.0 06/21/2011 0108   RADIOLOGIC DATA 12/7 No new films.   [place] of scrotum reviewed.     ASSESSMENT/PLAN:  OSA / OHS Assessment: Presumed obstructive sleep apnea/obesity hypoventilation syndrome in the setting of morbid obesity.   Plan: -Will need outpatient split-night sleep study and followup with sleep M.D. -AutoSet BiPAP during sleep (ordered) - DAY or NIGHT; discussed compliance w him today and he is willing to try  Tobacco Abuse Assessment:  Continued current smoker, indicates he is not ready to quit smoking. Plan: -Tobacco cessation counseling -has electronic cig  Scrotal Cellulitis Assessment: Plan: -Recommendations per [facility]  Levy Pupa, MD, PhD 06/22/2011, 11:16 AM [facility] [phone] or if no answer [phone]

## 2011-06-22 NOTE — Progress Notes (Signed)
ANTIBIOTIC CONSULT NOTE -FOLLOW-UP  Pharmacy Consult for Vancomycin/Zosyn Indication: scrotal cellulitis  No Known Allergies  Patient Measurements: Height: 5\' 10"  (177.8 cm) Weight: 608 lb 0.5 oz (275.8 kg) IBW/kg (Calculated) : 73   Vital Signs: Temp: 97.7 F (36.5 C) (12/08 2000) Temp src: Oral (12/08 2000) BP: 160/90 mmHg (12/08 2100) Pulse Rate: 81  (12/08 2333) Total I/O In: 300 [I.V.:300] Out: -   Labs:  Basename 06/22/11 0309 06/21/11 1120 06/21/11 0108  WBC 12.4* 13.0* 13.4*  HGB 12.9* 14.2 14.5  PLT 183 202 185  LABCREA -- -- --  CREATININE 0.66 0.73 0.63   Estimated Creatinine Clearance: 291.6 ml/min (by C-G formula based on Cr of 0.66).  Basename 06/22/11 2259  VANCOTROUGH 6.3*  VANCOPEAK --  Drue Dun --  GENTTROUGH --  GENTPEAK --  GENTRANDOM --  TOBRATROUGH --  TOBRAPEAK --  TOBRARND --  AMIKACINPEAK --  AMIKACINTROU --  AMIKACIN --     Microbiology: Recent Results (from the past 720 hour(s))  URINE CULTURE     Status: Normal   Collection Time   06/18/11 12:09 PM      Component Value Range Status Comment   Specimen Description URINE, RANDOM   Final    Special Requests NONE   Final    Setup Time [id number]   Final    Colony Count >=100,000 COLONIES/ML   Final    Culture     Final    Value: GROUP B STREP(S.AGALACTIAE)ISOLATED     Note: TESTING AGAINST S. AGALACTIAE NOT ROUTINELY PERFORMED DUE TO PREDICTABILITY OF AMP/PEN/VAN SUSCEPTIBILITY.   Report Status 06/19/2011 FINAL   Final   CULTURE, BLOOD (ROUTINE X 2)     Status: Normal (Preliminary result)   Collection Time   06/21/11  1:51 AM      Component Value Range Status Comment   Specimen Description BLOOD RIGHT FOREARM   Final    Special Requests BOTTLES DRAWN AEROBIC AND ANAEROBIC 5 CC EA   Final    Setup Time [id number]   Final    Culture     Final    Value:        BLOOD CULTURE RECEIVED NO GROWTH TO DATE CULTURE WILL BE HELD FOR 5 DAYS BEFORE ISSUING A FINAL NEGATIVE REPORT   Report Status PENDING   Incomplete   CULTURE, BLOOD (ROUTINE X 2)     Status: Normal (Preliminary result)   Collection Time   06/21/11  1:53 AM      Component Value Range Status Comment   Specimen Description BLOOD LEFT ANTECUBITAL   Final    Special Requests BOTTLES DRAWN AEROBIC AND ANAEROBIC 3 CC EA   Final    Setup Time [id number]   Final    Culture     Final    Value:        BLOOD CULTURE RECEIVED NO GROWTH TO DATE CULTURE WILL BE HELD FOR 5 DAYS BEFORE ISSUING A FINAL NEGATIVE REPORT   Report Status PENDING   Incomplete   MRSA PCR SCREENING     Status: Normal   Collection Time   06/21/11  4:33 PM      Component Value Range Status Comment   MRSA by PCR NEGATIVE  NEGATIVE  Final     Medical History: Past Medical History  Diagnosis Date  . Sleep apnea   . Panniculitis     History of  . History of acute bronchitis with bronchospasm   . Cellulitis of scrotum  History of  . Anemia     iron deficient  . Hypertension   . CHF (congestive heart failure)   . Obstructive sleep apnea   . Obesity hypoventilation syndrome   . Morbid obesity   . Cellulitis     lower extremities    Medications:  Scheduled:     . enoxaparin  110 mg Subcutaneous Q24H  .  morphine injection  4 mg Intravenous Once  . ondansetron (ZOFRAN) IV  4 mg Intravenous Once  . piperacillin-tazobactam (ZOSYN)  IV  3.375 g Intravenous Q8H  . vancomycin  1,500 mg Intravenous Q12H   Infusions:     . sodium chloride 150 mL/hr (06/22/11 2302)   Assessment: 30 yo male to be transferred to ICU with scrotal edema and cellulitis. Note patient has had prior cellulitis that was treated about 3 months ago.  Vancomycin trough level = 6.3 mcg/ml, which is subtherapeutic.  Pt continues on Zosyn 3.375gm IV q8h.  Goal of Therapy:  Vancomycin trough level 10-15 mcg/ml  Plan:  Increase Vancomycin to 1500mg  IV q8h.  Recheck vancomycin trough prior to 4th dose of new regimen.  Continue Zosyn.    Hadas Jessop, Joselyn Glassman 06/22/2011,11:50 PM

## 2011-06-23 MED ORDER — BIOTENE DRY MOUTH MT LIQD
15.0000 mL | Freq: Two times a day (BID) | OROMUCOSAL | Status: DC
  Administered 2011-06-23 – 2011-06-28 (×9): 15 mL via OROMUCOSAL

## 2011-06-23 MED ORDER — OXYCODONE-ACETAMINOPHEN 5-325 MG PO TABS: 1.0000 | ORAL_TABLET | Freq: Four times a day (QID) | ORAL | Status: DC | PRN

## 2011-06-23 NOTE — Progress Notes (Signed)
Subjective: Pt feels a little better today.  Still having pain at scrotum and leg but improved.  Pt denies any fever or chills. Objective: Filed Vitals:   06/22/11 2333 06/23/11 0000 06/23/11 0400 06/23/11 0800  BP:  164/91 182/91 155/80  Pulse: 81 87  81  Temp:  97.7 F (36.5 C) 97.8 F (36.6 C) 97.8 F (36.6 C)  TempSrc:  Axillary Oral Oral  Resp: 19 24 20 26   Height:      Weight:      SpO2: 95% 98% 88% 97%   Weight change:   Intake/Output Summary (Last 24 hours) at 06/23/11 0919 Last data filed at 06/23/11 0800  Gross per 24 hour  Intake 5677.5 ml  Output      0 ml  Net 5677.5 ml   General: Alert, awake, oriented x3, in no acute distress.  HEENT: No bruits, no goiter.  Heart: Regular rate and rhythm, without murmurs, rubs, gallops.  Lungs: no rhales, lungs CTA  Abdomen: Soft, nontender, nondistended, positive bowel sounds. Large panus  Neuro: Grossly intact, nonfocal.  Skin: Pt has cellulits at anterior scrotum and right leg. Erythema encompases most of right leg and is regressing from my markings done yesterday. Painful to the touch. No fluctuance    Lab Results:  Basename 06/22/11 0309 06/21/11 1120 06/21/11 0108  NA 135 -- 134*  K 4.7 -- 4.2  CL 98 -- 98  CO2 33* -- 31  GLUCOSE 107* -- 95  BUN 9 -- 11  CREATININE 0.66 0.73 --  CALCIUM 8.5 -- 8.6  MG -- -- --  PHOS -- -- --   No results found for this basename: AST:2,ALT:2,ALKPHOS:2,BILITOT:2,PROT:2,ALBUMIN:2 in the last 72 hours No results found for this basename: LIPASE:2,AMYLASE:2 in the last 72 hours  Basename 06/22/11 0309 06/21/11 1120 06/21/11 0108  WBC 12.4* 13.0* --  NEUTROABS -- -- 9.1*  HGB 12.9* 14.2 --  HCT 45.2 48.3 --  MCV 86.1 84.1 --  PLT 183 202 --   No results found for this basename: CKTOTAL:3,CKMB:3,CKMBINDEX:3,TROPONINI:3 in the last 72 hours No results found for this basename: POCBNP:3 in the last 72 hours No results found for this basename: DDIMER:2 in the last 72 hours No  results found for this basename: HGBA1C:2 in the last 72 hours No results found for this basename: CHOL:2,HDL:2,LDLCALC:2,TRIG:2,CHOLHDL:2,LDLDIRECT:2 in the last 72 hours  Basename 06/21/11 1120  TSH 3.749  T4TOTAL --  T3FREE --  THYROIDAB --   No results found for this basename: VITAMINB12:2,FOLATE:2,FERRITIN:2,TIBC:2,IRON:2,RETICCTPCT:2 in the last 72 hours  Micro Results: Recent Results (from the past 240 hour(s))  URINE CULTURE     Status: Normal   Collection Time   06/18/11 12:09 PM      Component Value Range Status Comment   Specimen Description URINE, RANDOM   Final    Special Requests NONE   Final    Setup Time [id number]   Final    Colony Count >=100,000 COLONIES/ML   Final    Culture     Final    Value: GROUP B STREP(S.AGALACTIAE)ISOLATED     Note: TESTING AGAINST S. AGALACTIAE NOT ROUTINELY PERFORMED DUE TO PREDICTABILITY OF AMP/PEN/VAN SUSCEPTIBILITY.   Report Status 06/19/2011 FINAL   Final   CULTURE, BLOOD (ROUTINE X 2)     Status: Normal (Preliminary result)   Collection Time   06/21/11  1:51 AM      Component Value Range Status Comment   Specimen Description BLOOD RIGHT FOREARM   Final  Special Requests BOTTLES DRAWN AEROBIC AND ANAEROBIC 5 CC EA   Final    Setup Time [id number]   Final    Culture     Final    Value:        BLOOD CULTURE RECEIVED NO GROWTH TO DATE CULTURE WILL BE HELD FOR 5 DAYS BEFORE ISSUING A FINAL NEGATIVE REPORT   Report Status PENDING   Incomplete   CULTURE, BLOOD (ROUTINE X 2)     Status: Normal (Preliminary result)   Collection Time   06/21/11  1:53 AM      Component Value Range Status Comment   Specimen Description BLOOD LEFT ANTECUBITAL   Final    Special Requests BOTTLES DRAWN AEROBIC AND ANAEROBIC 3 CC EA   Final    Setup Time [id number]   Final    Culture     Final    Value:        BLOOD CULTURE RECEIVED NO GROWTH TO DATE CULTURE WILL BE HELD FOR 5 DAYS BEFORE ISSUING A FINAL NEGATIVE REPORT   Report Status PENDING    Incomplete   MRSA PCR SCREENING     Status: Normal   Collection Time   06/21/11  4:33 PM      Component Value Range Status Comment   MRSA by PCR NEGATIVE  NEGATIVE  Final     Studies/Results: No results found.  Medications: I have reviewed the patient's current medications.   Patient Active Hospital Problem List  Scrotal/leg cellulitis: At this point WBC is trending down. There hasn't been afebrile within the last 24 hrs with his t max at 37.2. Patient's leg and scrotum is less painful to the touch and is showing some improvement. Blood cultures are negative.  Will check cbc tomorrow and plan on switching to oral abx's soon.  Obesity: Will plan on ordering a dietician consult. Have discussed with the patient and he is agreeable. Also will d/c social worker to help patient find resources for when he is d/c'd   OSA/Obesity hypoventilation syndrome: Pulm has evaluated patient and made certain recommendations. I appreciate their input. Will plan on recommending f/u as outpatient.   Will plan on trying to get patient out of the ICU stepdown unit.  Patient will require cpap at bedside.  Spoke with team regarding concerns.  Pt is stable enough for transfer to the floor.      LOS: 3 days   Penny Pia M.D.  [facility] 06/23/2011, 9:19 AM

## 2011-06-24 ENCOUNTER — Inpatient Hospital Stay (HOSPITAL_COMMUNITY): Payer: Self-pay

## 2011-06-24 LAB — BASIC METABOLIC PANEL
BUN: 5 mg/dL — ABNORMAL LOW (ref 6–23)
CO2: 39 mEq/L — ABNORMAL HIGH (ref 19–32)
GFR calc non Af Amer: >90 mL/min (ref >90)
Glucose, Bld: 98 mg/dL (ref 70–99)
Potassium: 4.3 mEq/L (ref 3.5–5.1)
Sodium: 136 mEq/L (ref 135–145)

## 2011-06-24 LAB — CREATININE, SERUM
Creatinine, Ser: 0.54 mg/dL (ref 0.50–1.35)
GFR calc Af Amer: >90 mL/min (ref >90)

## 2011-06-24 LAB — CBC
Hemoglobin: 12.1 g/dL — ABNORMAL LOW (ref 13.0–17.0)
MCH: 24.8 pg — ABNORMAL LOW (ref 26.0–34.0)
MCHC: 28.9 g/dL — ABNORMAL LOW (ref 30.0–36.0)
MCV: 85.8 fL (ref 78.0–100.0)
RBC: 4.87 MIL/uL (ref 4.22–5.81)

## 2011-06-24 MED ORDER — VANCOMYCIN HCL 1000 MG IV SOLR
1750.0000 mg | Freq: Once | INTRAVENOUS | Status: AC
  Administered 2011-06-24: 1750 mg via INTRAVENOUS
  Filled 2011-06-24: qty 1750

## 2011-06-24 MED ORDER — VANCOMYCIN HCL 1000 MG IV SOLR
1750.0000 mg | Freq: Three times a day (TID) | INTRAVENOUS | Status: DC
  Administered 2011-06-25 – 2011-06-26 (×4): 1750 mg via INTRAVENOUS
  Filled 2011-06-24 (×6): qty 1750

## 2011-06-24 MED ORDER — HYDRALAZINE HCL 20 MG/ML IJ SOLN
10.0000 mg | Freq: Four times a day (QID) | INTRAMUSCULAR | Status: DC | PRN
  Administered 2011-06-24 – 2011-06-28 (×3): 10 mg via INTRAVENOUS
  Filled 2011-06-24 (×3): qty 1

## 2011-06-24 MED ORDER — LEVALBUTEROL HCL 0.63 MG/3ML IN NEBU
0.6300 mg | INHALATION_SOLUTION | RESPIRATORY_TRACT | Status: DC | PRN
  Administered 2011-06-24 (×2): 0.63 mg via RESPIRATORY_TRACT
  Filled 2011-06-24 (×2): qty 3

## 2011-06-24 NOTE — Progress Notes (Signed)
ANTIBIOTIC CONSULT NOTE - FOLLOW UP  Pharmacy Consult for Vancomycin and Zosyn Indication: Cellulitis of scrotum and right leg  No Known Allergies  Patient Measurements: Height: 5\' 10"  (177.8 cm) Weight: 608 lb 0.5 oz (275.8 kg) IBW/kg (Calculated) : 73    Vital Signs: Temp: 97.8 F (36.6 C) (12/10 1320) Temp src: Oral (12/10 1320) BP: 174/93 mmHg (12/10 1343) Pulse Rate: 68  (12/10 1320)  Labs:  Basename 06/24/11 1501 06/24/11 1100 06/22/11 0309  WBC -- 9.4 12.4*  HGB -- 12.1* 12.9*  PLT -- 151 183  LABCREA -- -- --  CREATININE 0.54 0.49* 0.66   Estimated Creatinine Clearance: 291.6 ml/min (by C-G formula based on Cr of 0.54).    Microbiology: Recent Results (from the past 720 hour(s))  URINE CULTURE     Status: Normal   Collection Time   06/18/11 12:09 PM      Component Value Range Status Comment   Specimen Description URINE, RANDOM   Final    Special Requests NONE   Final    Setup Time [id number]   Final    Colony Count >=100,000 COLONIES/ML   Final    Culture     Final    Value: GROUP B STREP(S.AGALACTIAE)ISOLATED     Note: TESTING AGAINST S. AGALACTIAE NOT ROUTINELY PERFORMED DUE TO PREDICTABILITY OF AMP/PEN/VAN SUSCEPTIBILITY.   Report Status 06/19/2011 FINAL   Final   CULTURE, BLOOD (ROUTINE X 2)     Status: Normal (Preliminary result)   Collection Time   06/21/11  1:51 AM      Component Value Range Status Comment   Specimen Description BLOOD RIGHT FOREARM   Final    Special Requests BOTTLES DRAWN AEROBIC AND ANAEROBIC 5 CC EA   Final    Setup Time [id number]   Final    Culture     Final    Value:        BLOOD CULTURE RECEIVED NO GROWTH TO DATE CULTURE WILL BE HELD FOR 5 DAYS BEFORE ISSUING A FINAL NEGATIVE REPORT   Report Status PENDING   Incomplete   CULTURE, BLOOD (ROUTINE X 2)     Status: Normal (Preliminary result)   Collection Time   06/21/11  1:53 AM      Component Value Range Status Comment   Specimen Description BLOOD LEFT ANTECUBITAL    Final    Special Requests BOTTLES DRAWN AEROBIC AND ANAEROBIC 3 CC EA   Final    Setup Time [id number]   Final    Culture     Final    Value:        BLOOD CULTURE RECEIVED NO GROWTH TO DATE CULTURE WILL BE HELD FOR 5 DAYS BEFORE ISSUING A FINAL NEGATIVE REPORT   Report Status PENDING   Incomplete   MRSA PCR SCREENING     Status: Normal   Collection Time   06/21/11  4:33 PM      Component Value Range Status Comment   MRSA by PCR NEGATIVE  NEGATIVE  Final     Assessment:  Current Vancomycin dose is 1500 mg IV every 8 hours.  Trough level drawn prior to 16:00 dose today is reported as 9.9 mcg/ml. This level is slightly below the desired range.  Zosyn continues at 3.375 grams IV every 8 hours, with each dose infused over 4 hours.  Serum creatinine is reported as 0.54, acceptable for current dosing schedules  Goals of Therapy:   Vancomycin trough levels 10-15 mcg/ml  Eradication of infection  Plan:   Increase Vancomycin dose to 1750 mg every 8 hours  Continue Zosyn at current dose  [employer].Ph. 06/24/2011,4:40 PM

## 2011-06-24 NOTE — Progress Notes (Signed)
Brief follow up  Note that Andre [name]. is stable, still unable to tolerate positive pressure at night.  I would continue nocturnal O2 (daytime too based on SpO2) and schedule him for outpatient sleep study.   We will sign off, please call if we can help you.   Levy Pupa, MD, PhD 06/24/2011, 5:32 PM [facility] [phone] or if no answer [phone]

## 2011-06-24 NOTE — Significant Event (Signed)
Rapid Response Event Note  Overview: Time Called: 0910 Arrival Time: 0915 Event Type: Cardiac;Respiratory, RN suspected rapid irregular rhythm and was unable to obtain a good 12 lead rhythm.  Initial Focused Assessment: RRT moniters applied. SR with few PACs/PJCs. Rate 78. Alert and oriented x3. Dr. Cena Benton to bedside. Audible wheezing noted. Tight breath sounds and wheezing bilaterally. See notes   Interventions: stat PCXR and labs. Updated Dr Cena Benton for confirmation of orders.   Event Summary: Pt resting in bed awaiting breathing treatment.   at      at          [facility], Ferd Hibbs

## 2011-06-24 NOTE — Progress Notes (Signed)
Subjective: Pt feels better today no acute issues overnight.  This morning a rapid response was called because the machine was reading an elevated heart rate but on further review the machine wasn't properly connected and was giving an inaccurate reading.  Pt's HR has been within normal limits. Objective: Filed Vitals:   06/24/11 0219 06/24/11 0449 06/24/11 0931 06/24/11 1058  BP: 189/93 147/97 150/71   Pulse: 72 80 76   Temp: 97.6 F (36.4 C) 97.6 F (36.4 C)    TempSrc: Oral Axillary    Resp: 20 26 24    Height:      Weight:      SpO2: 91% 96% 99% 97%   Weight change:   Intake/Output Summary (Last 24 hours) at 06/24/11 1227 Last data filed at 06/24/11 0016  Gross per 24 hour  Intake 1472.5 ml  Output      0 ml  Net 1472.5 ml    General: Alert, awake, oriented x3, in no acute distress.  HEENT: No bruits, no goiter.  Heart: Regular rate and rhythm, without murmurs, rubs, gallops.  Lungs: no rhales, lungs CTA  Abdomen: Soft, nontender, nondistended, positive bowel sounds. Large panus  Neuro: Grossly intact, nonfocal.  Skin: Pt has cellulits at anterior scrotum and right leg. Erythema encompases most of right leg and continues to regress from my markings done yesterday. Painful to the touch. No fluctuance    Lab Results:  Basename 06/22/11 0309  NA 135  K 4.7  CL 98  CO2 33*  GLUCOSE 107*  BUN 9  CREATININE 0.66  CALCIUM 8.5  MG --  PHOS --   No results found for this basename: AST:2,ALT:2,ALKPHOS:2,BILITOT:2,PROT:2,ALBUMIN:2 in the last 72 hours No results found for this basename: LIPASE:2,AMYLASE:2 in the last 72 hours  Basename 06/24/11 1100 06/22/11 0309  WBC 9.4 12.4*  NEUTROABS -- --  HGB 12.1* 12.9*  HCT 41.8 45.2  MCV 85.8 86.1  PLT 151 183   No results found for this basename: CKTOTAL:3,CKMB:3,CKMBINDEX:3,TROPONINI:3 in the last 72 hours No components found with this basename: POCBNP:3 No results found for this basename: DDIMER:2 in the last 72  hours No results found for this basename: HGBA1C:2 in the last 72 hours No results found for this basename: CHOL:2,HDL:2,LDLCALC:2,TRIG:2,CHOLHDL:2,LDLDIRECT:2 in the last 72 hours No results found for this basename: TSH,T4TOTAL,FREET3,T3FREE,THYROIDAB in the last 72 hours No results found for this basename: VITAMINB12:2,FOLATE:2,FERRITIN:2,TIBC:2,IRON:2,RETICCTPCT:2 in the last 72 hours  Micro Results: Recent Results (from the past 240 hour(s))  URINE CULTURE     Status: Normal   Collection Time   06/18/11 12:09 PM      Component Value Range Status Comment   Specimen Description URINE, RANDOM   Final    Special Requests NONE   Final    Setup Time [id number]   Final    Colony Count >=100,000 COLONIES/ML   Final    Culture     Final    Value: GROUP B STREP(S.AGALACTIAE)ISOLATED     Note: TESTING AGAINST S. AGALACTIAE NOT ROUTINELY PERFORMED DUE TO PREDICTABILITY OF AMP/PEN/VAN SUSCEPTIBILITY.   Report Status 06/19/2011 FINAL   Final   CULTURE, BLOOD (ROUTINE X 2)     Status: Normal (Preliminary result)   Collection Time   06/21/11  1:51 AM      Component Value Range Status Comment   Specimen Description BLOOD RIGHT FOREARM   Final    Special Requests BOTTLES DRAWN AEROBIC AND ANAEROBIC 5 CC EA   Final    Setup Time  [id number]   Final    Culture     Final    Value:        BLOOD CULTURE RECEIVED NO GROWTH TO DATE CULTURE WILL BE HELD FOR 5 DAYS BEFORE ISSUING A FINAL NEGATIVE REPORT   Report Status PENDING   Incomplete   CULTURE, BLOOD (ROUTINE X 2)     Status: Normal (Preliminary result)   Collection Time   06/21/11  1:53 AM      Component Value Range Status Comment   Specimen Description BLOOD LEFT ANTECUBITAL   Final    Special Requests BOTTLES DRAWN AEROBIC AND ANAEROBIC 3 CC EA   Final    Setup Time [id number]   Final    Culture     Final    Value:        BLOOD CULTURE RECEIVED NO GROWTH TO DATE CULTURE WILL BE HELD FOR 5 DAYS BEFORE ISSUING A FINAL NEGATIVE REPORT    Report Status PENDING   Incomplete   MRSA PCR SCREENING     Status: Normal   Collection Time   06/21/11  4:33 PM      Component Value Range Status Comment   MRSA by PCR NEGATIVE  NEGATIVE  Final     Studies/Results: Dg Chest Port 1 View  06/24/2011  *RADIOLOGY REPORT*  Clinical Data: Wheezing.  Respiratory distress.  PORTABLE CHEST - 1 VIEW  Comparison: Chest x-ray 01/23/2009.  Findings: Examination is limited by body habitus.  The heart is enlarged but appears stable.  The mediastinal and hilar contours are quite prominent but unchanged.  There is marked vascular congestion and probable interstitial edema.  No large pleural effusions or obvious infiltrates.  The bony thorax is grossly intact.  IMPRESSION:  1.  Limited examination by body habitus and underpenetration. 2.  Cardiac enlargement and stable prominent mediastinal and hilar contours. 3.  Vascular congestion and possible pulmonary edema.  Original Report Authenticated By: P. Loralie Champagne, M.D.    Medications: I have reviewed the patient's current medications.   Patient Active Hospital Problem List  Scrotal/leg cellulitis: - continue IV abx's, plan on changing to po tomorrow should he continue improvement.  Obesity: Will plan on ordering a dietician consult. Have discussed with the patient and he is agreeable. Also will d/c social worker to help patient find resources for when he is d/c'd   OSA/Obesity hypoventilation syndrome: Pulm has evaluated patient and made certain recommendations. I appreciate their input. While inpatient we have recommended cpap while asleep.  Pt aware but refuses therapy and wishes to use supplemental oxygen through face mask.  He understands the risks associated with not using his cpap      LOS: 4 days   Penny Pia M.D.  [facility] 06/24/2011, 12:27 PM

## 2011-06-24 NOTE — Progress Notes (Signed)
BP 189/93  T.Claiborne Billings PA notified,will place order for antihypertensive medication.

## 2011-06-24 NOTE — Progress Notes (Signed)
Called to restart iv for pt; pt on vanco 1500 mg tid, and zosyn;  Pt with very poor access!  Suggest picc line for this pt!  Please advise;  Thank you.

## 2011-06-25 NOTE — Progress Notes (Signed)
Subjective: Pt mentions that he feels better but that he is still having discomfort at his scrotum.  No acute issues overnight.  Pt was inquiring about help with social services regarding services and programs for weight loss.  Denies any fever or chills, nausea.  Objective: Filed Vitals:   06/24/11 2219 06/24/11 2252 06/25/11 0536 06/25/11 1345  BP: 128/73  150/74 136/76  Pulse: 77 82 65 78  Temp: 97.5 F (36.4 C)  97.7 F (36.5 C) 97.9 F (36.6 C)  TempSrc: Oral   Oral  Resp: 22 22 24 28   Height:      Weight:      SpO2: 99% 92% 95% 97%   Weight change:   Intake/Output Summary (Last 24 hours) at 06/25/11 1517 Last data filed at 06/25/11 1300  Gross per 24 hour  Intake   1895 ml  Output   1151 ml  Net    744 ml    General: Alert, awake, oriented x3, in no acute distress.  HEENT: No bruits, no goiter.  Heart: Regular rate and rhythm, without murmurs, rubs, gallops.  Lungs: Clear to auscultation.  Abdomen: Soft, nontender, nondistended, positive bowel sounds, large panus  Neuro: Grossly intact, nonfocal. Extremities:  There is cellulitis at right leg that is receding past marked line no fluctuance. Painful to palpation GU:  Erythema at scrotum still painful to palpation  Lab Results:  [facility] 06/24/11 1501 06/24/11 1100  NA -- 136  K -- 4.3  CL -- 94*  CO2 -- 39*  GLUCOSE -- 98  BUN -- 5*  CREATININE 0.54 0.49*  CALCIUM -- 8.9  MG -- 1.9  PHOS -- 3.7   No results found for this basename: AST:2,ALT:2,ALKPHOS:2,BILITOT:2,PROT:2,ALBUMIN:2 in the last 72 hours No results found for this basename: LIPASE:2,AMYLASE:2 in the last 72 hours  Basename 06/24/11 1100  WBC 9.4  NEUTROABS --  HGB 12.1*  HCT 41.8  MCV 85.8  PLT 151   No results found for this basename: CKTOTAL:3,CKMB:3,CKMBINDEX:3,TROPONINI:3 in the last 72 hours No components found with this basename: POCBNP:3 No results found for this basename: DDIMER:2 in the last 72 hours No results found for this  basename: HGBA1C:2 in the last 72 hours No results found for this basename: CHOL:2,HDL:2,LDLCALC:2,TRIG:2,CHOLHDL:2,LDLDIRECT:2 in the last 72 hours No results found for this basename: TSH,T4TOTAL,FREET3,T3FREE,THYROIDAB in the last 72 hours No results found for this basename: VITAMINB12:2,FOLATE:2,FERRITIN:2,TIBC:2,IRON:2,RETICCTPCT:2 in the last 72 hours  Micro Results: Recent Results (from the past 240 hour(s))  URINE CULTURE     Status: Normal   Collection Time   06/18/11 12:09 PM      Component Value Range Status Comment   Specimen Description URINE, RANDOM   Final    Special Requests NONE   Final    Setup Time [id number]   Final    Colony Count >=100,000 COLONIES/ML   Final    Culture     Final    Value: GROUP B STREP(S.AGALACTIAE)ISOLATED     Note: TESTING AGAINST S. AGALACTIAE NOT ROUTINELY PERFORMED DUE TO PREDICTABILITY OF AMP/PEN/VAN SUSCEPTIBILITY.   Report Status 06/19/2011 FINAL   Final   CULTURE, BLOOD (ROUTINE X 2)     Status: Normal (Preliminary result)   Collection Time   06/21/11  1:51 AM      Component Value Range Status Comment   Specimen Description BLOOD RIGHT FOREARM   Final    Special Requests BOTTLES DRAWN AEROBIC AND ANAEROBIC 5 CC EA   Final    Setup Time [id number]  Final    Culture     Final    Value:        BLOOD CULTURE RECEIVED NO GROWTH TO DATE CULTURE WILL BE HELD FOR 5 DAYS BEFORE ISSUING A FINAL NEGATIVE REPORT   Report Status PENDING   Incomplete   CULTURE, BLOOD (ROUTINE X 2)     Status: Normal (Preliminary result)   Collection Time   06/21/11  1:53 AM      Component Value Range Status Comment   Specimen Description BLOOD LEFT ANTECUBITAL   Final    Special Requests BOTTLES DRAWN AEROBIC AND ANAEROBIC 3 CC EA   Final    Setup Time [id number]   Final    Culture     Final    Value:        BLOOD CULTURE RECEIVED NO GROWTH TO DATE CULTURE WILL BE HELD FOR 5 DAYS BEFORE ISSUING A FINAL NEGATIVE REPORT   Report Status PENDING   Incomplete     MRSA PCR SCREENING     Status: Normal   Collection Time   06/21/11  4:33 PM      Component Value Range Status Comment   MRSA by PCR NEGATIVE  NEGATIVE  Final     Studies/Results: Dg Chest Port 1 View  06/24/2011  *RADIOLOGY REPORT*  Clinical Data: Wheezing.  Respiratory distress.  PORTABLE CHEST - 1 VIEW  Comparison: Chest x-ray 01/23/2009.  Findings: Examination is limited by body habitus.  The heart is enlarged but appears stable.  The mediastinal and hilar contours are quite prominent but unchanged.  There is marked vascular congestion and probable interstitial edema.  No large pleural effusions or obvious infiltrates.  The bony thorax is grossly intact.  IMPRESSION:  1.  Limited examination by body habitus and underpenetration. 2.  Cardiac enlargement and stable prominent mediastinal and hilar contours. 3.  Vascular congestion and possible pulmonary edema.  Original Report Authenticated By: P. Loralie Champagne, M.D.    Medications: I have reviewed the patient's current medications.   Patient Active Hospital Problem List: CELLULITIS, LEG, LEFT (11/12/2008) Pt is improving on IV antibiotic.  Still having some discomfort > scrotum than leg.  Thus will plan on giving him one more day of IV abx and plan on switching to oral abx tomorrow.  Pt has history of uncompliance with medication and thus I have decided to continue IV antibiotic today.  Morbid obesity (11/12/2008)   Touch base with social services to see if there are any places available for weight loss.   cpap q hs as indicated  TOBACCO ABUSE (03/23/2009)  Discussed tobacco cessation.  OBSTRUCTIVE SLEEP APNEA (11/12/2008)  Pt to continue cpap at night as indicated.  Obesity hypoventilation syndrome Pt on supplemental O2.  HYPERTENSION, BORDERLINE (11/12/2008)  Likely related to OSA will continue to monitor and have discussed compliance with cpap.  Pt expresses understanding.     LOS: 5 days   Penny Pia M.D.  [facility] 06/25/2011, 3:17 PM

## 2011-06-25 NOTE — Progress Notes (Signed)
Pt placed on cpap at this time. Cpap in auto mode 10-20 cmH2O with 4L O2 bleed in. Pt tolerating well at this time, SpO2-96%.  Jacqulynn Cadet RRT

## 2011-06-25 NOTE — Plan of Care (Signed)
Problem: Problem: Respiratory Progression Goal: CPAP/BI-PAP utilized with sleeping per order Outcome: Progressing Pt wore BI-PAP las HS d/t enxouragement from his mother.  Maintained Venturi mask all day, 12/11.

## 2011-06-25 NOTE — Progress Notes (Signed)
UR review completed. mp 

## 2011-06-26 DIAGNOSIS — M7989 Other specified soft tissue disorders: Secondary | ICD-10-CM

## 2011-06-26 LAB — CREATININE, SERUM
Creatinine, Ser: 0.57 mg/dL (ref 0.50–1.35)
GFR calc non Af Amer: >90 mL/min (ref >90)

## 2011-06-26 LAB — CBC
MCH: 24.4 pg — ABNORMAL LOW (ref 26.0–34.0)
MCV: 84.2 fL (ref 78.0–100.0)
Platelets: 161 10*3/uL (ref 150–400)
RBC: 4.95 MIL/uL (ref 4.22–5.81)
RDW: 15.3 % (ref 11.5–15.5)

## 2011-06-26 MED ORDER — SULFAMETHOXAZOLE-TMP DS 800-160 MG PO TABS
1.0000 | ORAL_TABLET | Freq: Two times a day (BID) | ORAL | Status: DC
  Administered 2011-06-26 – 2011-06-28 (×5): 1 via ORAL
  Filled 2011-06-26 (×6): qty 1

## 2011-06-26 MED ORDER — CEFUROXIME AXETIL 500 MG PO TABS
500.0000 mg | ORAL_TABLET | Freq: Two times a day (BID) | ORAL | Status: DC
  Administered 2011-06-26 – 2011-06-28 (×5): 500 mg via ORAL
  Filled 2011-06-26 (×6): qty 1

## 2011-06-26 MED ORDER — HYDROCHLOROTHIAZIDE 25 MG PO TABS
25.0000 mg | ORAL_TABLET | Freq: Every day | ORAL | Status: DC
  Administered 2011-06-26 – 2011-06-27 (×2): 25 mg via ORAL
  Filled 2011-06-26 (×2): qty 1

## 2011-06-26 NOTE — Progress Notes (Signed)
*  PRELIMINARY RESULTS* Bilateral lower venous dopplers preformed. Preliminary findings showed no obvious evidence of DVT, superficial thrombus or Bakers cyst.  Vinetta Bergamo 06/26/2011, 12:20 PM

## 2011-06-26 NOTE — Progress Notes (Signed)
RT found pt already on CPAP for the night.  Pt tolerating well and encouraged to notify RN or RT of any concerns.

## 2011-06-26 NOTE — Progress Notes (Signed)
ANTIBIOTIC CONSULT NOTE - FOLLOW UP  Pharmacy Consult for Vancomycin and Zosyn Indication: Cellulitis of scrotum and right leg  No Known Allergies  Patient Measurements: Height: 5\' 10"  (177.8 cm) Weight: 608 lb 0.5 oz (275.8 kg) IBW/kg (Calculated) : 73    Vital Signs: Temp: 98 F (36.7 C) (12/12 0601) BP: 157/76 mmHg (12/12 0601) Pulse Rate: 62  (12/12 0601)  Labs:  Basename 06/26/11 0330 06/24/11 1501 06/24/11 1100  WBC 8.3 -- 9.4  HGB 12.1* -- 12.1*  PLT 161 -- 151  LABCREA -- -- --  CREATININE 0.57 0.54 0.49*   Estimated Creatinine Clearance: 291.6 ml/min (by C-G formula based on Cr of 0.57).    Microbiology: Recent Results (from the past 720 hour(s))  URINE CULTURE     Status: Normal   Collection Time   06/18/11 12:09 PM      Component Value Range Status Comment   Specimen Description URINE, RANDOM   Final    Special Requests NONE   Final    Setup Time [id number]   Final    Colony Count >=100,000 COLONIES/ML   Final    Culture     Final    Value: GROUP B STREP(S.AGALACTIAE)ISOLATED     Note: TESTING AGAINST S. AGALACTIAE NOT ROUTINELY PERFORMED DUE TO PREDICTABILITY OF AMP/PEN/VAN SUSCEPTIBILITY.   Report Status 06/19/2011 FINAL   Final   CULTURE, BLOOD (ROUTINE X 2)     Status: Normal (Preliminary result)   Collection Time   06/21/11  1:51 AM      Component Value Range Status Comment   Specimen Description BLOOD RIGHT FOREARM   Final    Special Requests BOTTLES DRAWN AEROBIC AND ANAEROBIC 5 CC EA   Final    Setup Time [id number]   Final    Culture     Final    Value:        BLOOD CULTURE RECEIVED NO GROWTH TO DATE CULTURE WILL BE HELD FOR 5 DAYS BEFORE ISSUING A FINAL NEGATIVE REPORT   Report Status PENDING   Incomplete   CULTURE, BLOOD (ROUTINE X 2)     Status: Normal (Preliminary result)   Collection Time   06/21/11  1:53 AM      Component Value Range Status Comment   Specimen Description BLOOD LEFT ANTECUBITAL   Final    Special Requests BOTTLES  DRAWN AEROBIC AND ANAEROBIC 3 CC EA   Final    Setup Time [id number]   Final    Culture     Final    Value:        BLOOD CULTURE RECEIVED NO GROWTH TO DATE CULTURE WILL BE HELD FOR 5 DAYS BEFORE ISSUING A FINAL NEGATIVE REPORT   Report Status PENDING   Incomplete   MRSA PCR SCREENING     Status: Normal   Collection Time   06/21/11  4:33 PM      Component Value Range Status Comment   MRSA by PCR NEGATIVE  NEGATIVE  Final     Assessment:  Day 6 Vancomycin and Zosyn.   Afebrile  Serum creatinine is stable.   Goals of Therapy:   Vancomycin trough levels 10-15 mcg/ml  Eradication of infection  Plan:   Continue Vancomycin 1750mg  q8h  Continue Zosyn at current dose  Consider rechecking Vanc trough soon if not switched to PO abx soon.  Charolotte Eke, PharmD, pager [phone]. 06/26/2011,12:54 PM.

## 2011-06-26 NOTE — Progress Notes (Signed)
Physical Therapy Evaluation Patient Details Name: JAIMIE REDDITT MRN: [id number] DOB: 1980-09-21 Today's Date: 06/26/2011 14:05-14:40, EV 2  Problem List:  Patient Active Problem List  Diagnoses  . Morbid obesity  . OBESITY HYPOVENTILATION SYNDROME  . TOBACCO ABUSE  . OBSTRUCTIVE SLEEP APNEA  . HYPERTENSION, BORDERLINE  . CELLULITIS, LEG, LEFT  . EDEMA    Past Medical History:  Past Medical History  Diagnosis Date  . Sleep apnea   . Panniculitis     History of  . History of acute bronchitis with bronchospasm   . Cellulitis of scrotum     History of  . Anemia     iron deficient  . Hypertension   . CHF (congestive heart failure)   . Obstructive sleep apnea   . Obesity hypoventilation syndrome   . Morbid obesity   . Cellulitis     lower extremities   Past Surgical History: History reviewed. No pertinent past surgical history.  PT Assessment/Plan/Recommendation PT Assessment Clinical Impression Statement: 30y/o WF obese male with decreased independence in functional mobility.  His sats decrease quickly even with CPAP on 5 L/min.  Pt's mobility limited by scrotal swelling and pain. PT Recommendation/Assessment: Patient will need skilled PT in the acute care venue PT Problem List: Pain;Obesity;Decreased activity tolerance;Decreased mobility PT Therapy Diagnosis : Difficulty walking;Acute pain PT Plan PT Frequency: Min 3X/week PT Treatment/Interventions: Gait training;Functional mobility training;Therapeutic activities PT Recommendation Recommendations for Other Services: OT consult Follow Up Recommendations: None;Home health PT Equipment Recommended: None recommended by PT PT Goals  Acute Rehab PT Goals PT Goal Formulation: With patient Time For Goal Achievement: 2 weeks Pt will go Supine/Side to Sit: with modified independence PT Goal: Supine/Side to Sit - Progress: Progressing toward goal Pt will Stand: with modified independence PT Goal: Stand - Progress:  Progressing toward goal Pt will Ambulate: 16 - 50 feet;with least restrictive assistive device PT Goal: Ambulate - Progress: Progressing toward goal Pt will Go Up / Down Stairs: 3-5 stairs PT Goal: Up/Down Stairs - Progress:  (not addressed)  PT Evaluation Precautions/Restrictions  Precautions Precaution Comments: obesity Prior Functioning  Home Living Lives With: Family (parents) Receives Help From: Family Type of Home: House Home Layout: One level Home Access: Stairs to enter Entrance Stairs-Rails: None Secretary/administrator of Steps: 3-4 Bathroom Shower/Tub: Teacher, adult education: None Prior Function Level of Independence: Independent with gait Vocation: Unemployed Cognition Cognition Arousal/Alertness: Awake/alert Overall Cognitive Status: Appears within functional limits for tasks assessed Sensation/Coordination Sensation Light Touch: Appears Intact Extremity Assessment RLE Assessment RLE Assessment: Within Functional Limits LLE Assessment LLE Assessment: Within Functional Limits Mobility (including Balance) Bed Mobility Bed Mobility: Yes Supine to Sit: 5: Supervision;With rails;HOB elevated (Comment degrees) Supine to Sit Details (indicate cue type and reason): HOB 45 degrees with heavy use of rails on bari-bed. Pt states he is only relying so much on rails due to scrotum pain.  Pt had taken off o2 and upon sitting EOB was 81% and HR 84. Re-applied CPAP on 5 L/min and increased to 94% HR 82 after 2 mins. Transfers Transfers: Yes Sit to Stand: 4: Min assist;From elevated surface;From bed Sit to Stand Details (indicate cue type and reason): MIN/guard.  Grabbing onto RW. Ambulation/Gait Ambulation/Gait: Yes Ambulation/Gait Assistance: 4: Min assist Ambulation/Gait Assistance Details (indicate cue type and reason): MIN/guard.  Very wide BOS secondary to scrotal swelling.  Side-stepping to [facility] with CPAP on. Ambulation Distance (Feet): 3  Feet Assistive device: Other (Comment) (holding onto the rail.) Gait Pattern:  (  wide BOS)    Exercise    End of Session PT - End of Session Equipment Utilized During Treatment: Gait belt Activity Tolerance: Patient limited by pain Patient left: in bed;with call bell in reach Nurse Communication: Mobility status for transfers (oxygen status) General Behavior During Session: [facility] for tasks performed Cognition: [facility] for tasks performed  [facility] LUBECK 06/26/2011, 3:07 PM

## 2011-06-26 NOTE — Progress Notes (Signed)
QUALIFIES FOR INDIGENT FUNDS IF NEEDED.

## 2011-06-26 NOTE — Progress Notes (Signed)
Pt  Is 83% on room air. Unable to ambulate due to size, SOB and scrotal edema. And up to  88% on 02 5L.Marland Kitchen Pt is 92% at rest on 02

## 2011-06-26 NOTE — Progress Notes (Signed)
PATIENT FROM HOME W/MOTHER SUPPORT.[facility]IGIBILITY/[facility] APPT SET.REFERRAL SENT TO [facility] FOLLOWING FOR HH/DME.02 SATS QUALIFY FOR HOME 02.RECOMMEND HHRN-DISEASE MGMNT.MAY NEED PT/OT EVAL IF MD FEEL APPROPRIATE.WILL CHECK IF QUALIFY FOR INDIGENT FUNDS.$4 [employer]/TARGET LIST GIVEN.WILL NEED AMBULANCE TRANSP @ D/C.

## 2011-06-26 NOTE — Progress Notes (Signed)
After receiving order for full face mask for cpap, pt placed on mask with 6L O2 bled in.  Pt was very sleepy and seemed comfortable with sats of 95%.  Pt states that he has not had a sleep study. He has been non compliant regarding cpap through most of his current admission.

## 2011-06-26 NOTE — Progress Notes (Signed)
Subjective: Patient continues to complain of scrotal pain.  Also reports that his leg feels better.  Objective: Vital signs in last 24 hours: Filed Vitals:   06/25/11 2140 06/25/11 2220 06/26/11 0601 06/26/11 1338  BP:  174/87 157/76 159/95  Pulse: 72 73 62 64  Temp:  97.3 F (36.3 C) 98 F (36.7 C) 97.7 F (36.5 C)  TempSrc:  Oral  Oral  Resp: 24 20 24 22   Height:      Weight:      SpO2: 96% 94% 92% 91%   Weight change:   Intake/Output Summary (Last 24 hours) at 06/26/11 1520 Last data filed at 06/26/11 1300  Gross per 24 hour  Intake   1586 ml  Output      0 ml  Net   1586 ml   Physical Exam: General: Awake, Oriented, No acute distress. HEENT: EOMI. Neck: Supple CV: S1 and S2 Lungs: Clear to ascultation bilaterally Abdomen: Soft, Nontender, Nondistended, +bowel sounds, morbid obesity with large pannus. Ext: Edema noted with cellulitis of leg with thin margins. GU: Scrotum swollen measuring approximately 10 cm with tenderness to palpation.  Lab Results:  Basename 06/26/11 0330 06/24/11 1501 06/24/11 1100  NA -- -- 136  K -- -- 4.3  CL -- -- 94*  CO2 -- -- 39*  GLUCOSE -- -- 98  BUN -- -- 5*  CREATININE 0.57 0.54 --  CALCIUM -- -- 8.9  MG -- -- 1.9  PHOS -- -- 3.7   No results found for this basename: AST:2,ALT:2,ALKPHOS:2,BILITOT:2,PROT:2,ALBUMIN:2 in the last 72 hours No results found for this basename: LIPASE:2,AMYLASE:2 in the last 72 hours  Basename 06/26/11 0330 06/24/11 1100  WBC 8.3 9.4  NEUTROABS -- --  HGB 12.1* 12.1*  HCT 41.7 41.8  MCV 84.2 85.8  PLT 161 151   No results found for this basename: CKTOTAL:3,CKMB:3,CKMBINDEX:3,TROPONINI:3 in the last 72 hours No components found with this basename: POCBNP:3 No results found for this basename: DDIMER:2 in the last 72 hours No results found for this basename: HGBA1C:2 in the last 72 hours No results found for this basename: CHOL:2,HDL:2,LDLCALC:2,TRIG:2,CHOLHDL:2,LDLDIRECT:2 in the last 72  hours No results found for this basename: TSH,T4TOTAL,FREET3,T3FREE,THYROIDAB in the last 72 hours No results found for this basename: VITAMINB12:2,FOLATE:2,FERRITIN:2,TIBC:2,IRON:2,RETICCTPCT:2 in the last 72 hours  Micro Results: Recent Results (from the past 240 hour(s))  URINE CULTURE     Status: Normal   Collection Time   06/18/11 12:09 PM      Component Value Range Status Comment   Specimen Description URINE, RANDOM   Final    Special Requests NONE   Final    Setup Time [id number]   Final    Colony Count >=100,000 COLONIES/ML   Final    Culture     Final    Value: GROUP B STREP(S.AGALACTIAE)ISOLATED     Note: TESTING AGAINST S. AGALACTIAE NOT ROUTINELY PERFORMED DUE TO PREDICTABILITY OF AMP/PEN/VAN SUSCEPTIBILITY.   Report Status 06/19/2011 FINAL   Final   CULTURE, BLOOD (ROUTINE X 2)     Status: Normal (Preliminary result)   Collection Time   06/21/11  1:51 AM      Component Value Range Status Comment   Specimen Description BLOOD RIGHT FOREARM   Final    Special Requests BOTTLES DRAWN AEROBIC AND ANAEROBIC 5 CC EA   Final    Setup Time [id number]   Final    Culture     Final    Value:  BLOOD CULTURE RECEIVED NO GROWTH TO DATE CULTURE WILL BE HELD FOR 5 DAYS BEFORE ISSUING A FINAL NEGATIVE REPORT   Report Status PENDING   Incomplete   CULTURE, BLOOD (ROUTINE X 2)     Status: Normal (Preliminary result)   Collection Time   06/21/11  1:53 AM      Component Value Range Status Comment   Specimen Description BLOOD LEFT ANTECUBITAL   Final    Special Requests BOTTLES DRAWN AEROBIC AND ANAEROBIC 3 CC EA   Final    Setup Time [id number]   Final    Culture     Final    Value:        BLOOD CULTURE RECEIVED NO GROWTH TO DATE CULTURE WILL BE HELD FOR 5 DAYS BEFORE ISSUING A FINAL NEGATIVE REPORT   Report Status PENDING   Incomplete   MRSA PCR SCREENING     Status: Normal   Collection Time   06/21/11  4:33 PM      Component Value Range Status Comment   MRSA by PCR NEGATIVE   NEGATIVE  Final     Studies/Results: No results found.  Medications: I have reviewed the patient's current medications. Scheduled Meds:   . antiseptic oral rinse  15 mL Mouth Rinse BID  . cefUROXime  500 mg Oral BID WC  . enoxaparin  110 mg Subcutaneous Q24H  . ondansetron (ZOFRAN) IV  4 mg Intravenous Once  . sulfamethoxazole-trimethoprim  1 tablet Oral Q12H  . DISCONTD: piperacillin-tazobactam (ZOSYN)  IV  3.375 g Intravenous Q8H  . DISCONTD: vancomycin  1,750 mg Intravenous Q8H   Continuous Infusions:  PRN Meds:.hydrALAZINE, levalbuterol, oxyCODONE-acetaminophen  Assessment/Plan: 1. CELLULITIS, LEG.  Change antibiotics from vancomycin and Zosyn to cefuroxime and Bactrim.  Will trend the Dopplers were obtained and were negative for DVT bilaterally.  2.  Scrotal swelling.  Ultrasound obtained on 06/21/2011 showed persistent marked scrotal soft tissue swelling measuring up to 10 cm in thickness.  Compatible with severe chronic or recurrent cellulitis.  No definite free air noted.  Large right epididymal cyst measuring 5.0 cm in size.  3. Morbid obesity.  Management as per outpatient.  4. OBESITY HYPOVENTILATION SYNDROME.  Continue CPAP at night.  Encourage compliance.  5. TOBACCO ABUSE.  Encouraged cessation.  6. OBSTRUCTIVE SLEEP APNEA.  Continue CPAP.  7. HYPERTENSION has been on the high side will start the patient on hydrochlorothiazide.  8.  Prophylaxis Lovenox for DVT prophylaxis.  9.  Disposition.  Pending.  Evaluated by physical therapy today.  Consider possible discharge tomorrow.   LOS: 6 days  Anuar Walgren A, MD 06/26/2011, 3:20 PM

## 2011-06-27 LAB — CBC
HCT: 42.8 % (ref 39.0–52.0)
MCHC: 29.2 g/dL — ABNORMAL LOW (ref 30.0–36.0)
Platelets: 159 10*3/uL (ref 150–400)
RDW: 15.5 % (ref 11.5–15.5)
WBC: 7.6 10*3/uL (ref 4.0–10.5)

## 2011-06-27 LAB — BLOOD GAS, ARTERIAL
Acid-Base Excess: 14.6 mmol/L — ABNORMAL HIGH (ref 0.0–2.0)
Drawn by: 313061
FIO2: 0.21 %
pCO2 arterial: 64.2 mmHg (ref 35.0–45.0)
pH, Arterial: 7.429 (ref 7.350–7.450)
pO2, Arterial: 50.7 mmHg — ABNORMAL LOW (ref 80.0–100.0)

## 2011-06-27 LAB — BASIC METABOLIC PANEL
BUN: 9 mg/dL (ref 6–23)
Chloride: 94 mEq/L — ABNORMAL LOW (ref 96–112)
GFR calc Af Amer: >90 mL/min (ref >90)
GFR calc non Af Amer: >90 mL/min (ref >90)
Potassium: 3.9 mEq/L (ref 3.5–5.1)
Sodium: 137 mEq/L (ref 135–145)

## 2011-06-27 LAB — CULTURE, BLOOD (ROUTINE X 2)
Culture: NO GROWTH
Culture: NO GROWTH

## 2011-06-27 MED ORDER — HYDROCHLOROTHIAZIDE 50 MG PO TABS
50.0000 mg | ORAL_TABLET | Freq: Every day | ORAL | Status: DC
  Administered 2011-06-28: 50 mg via ORAL
  Filled 2011-06-27: qty 1

## 2011-06-27 NOTE — Progress Notes (Signed)
Subjective: Patient continues to have frequent desaturations even while on CPAP.  Feeling better today.  Objective: Vital signs in last 24 hours: Filed Vitals:   06/26/11 1338 06/26/11 2140 06/27/11 0601 06/27/11 1416  BP: 159/95 184/94 181/98 177/82  Pulse: 64 77 70 83  Temp: 97.7 F (36.5 C) 97.9 F (36.6 C) 97.4 F (36.3 C) 97.6 F (36.4 C)  TempSrc: Oral Oral Axillary Oral  Resp: 22 21 22 22   Height:      Weight:      SpO2: 91% 94% 95% 96%   Weight change:   Intake/Output Summary (Last 24 hours) at 06/27/11 1521 Last data filed at 06/27/11 1300  Gross per 24 hour  Intake    960 ml  Output      0 ml  Net    960 ml   Physical Exam: General: Awake, Oriented, No acute distress. HEENT: EOMI. Neck: Supple CV: S1 and S2 Lungs: Clear to ascultation bilaterally Abdomen: Soft, Nontender, Nondistended, +bowel sounds, morbid obesity with large pannus. Ext: Edema noted with cellulitis of leg with in margins. GU: Scrotum swollen measuring approximately 10 cm with tenderness to palpation.  Lab Results:  Basename 06/27/11 0335 06/26/11 0330  NA 137 --  K 3.9 --  CL 94* --  CO2 41* --  GLUCOSE 88 --  BUN 9 --  CREATININE 0.62 0.57  CALCIUM 9.6 --  MG -- --  PHOS -- --   No results found for this basename: AST:2,ALT:2,ALKPHOS:2,BILITOT:2,PROT:2,ALBUMIN:2 in the last 72 hours No results found for this basename: LIPASE:2,AMYLASE:2 in the last 72 hours  Basename 06/27/11 0335 06/26/11 0330  WBC 7.6 8.3  NEUTROABS -- --  HGB 12.5* 12.1*  HCT 42.8 41.7  MCV 82.9 84.2  PLT 159 161   No results found for this basename: CKTOTAL:3,CKMB:3,CKMBINDEX:3,TROPONINI:3 in the last 72 hours No components found with this basename: POCBNP:3 No results found for this basename: DDIMER:2 in the last 72 hours No results found for this basename: HGBA1C:2 in the last 72 hours No results found for this basename: CHOL:2,HDL:2,LDLCALC:2,TRIG:2,CHOLHDL:2,LDLDIRECT:2 in the last 72 hours No  results found for this basename: TSH,T4TOTAL,FREET3,T3FREE,THYROIDAB in the last 72 hours No results found for this basename: VITAMINB12:2,FOLATE:2,FERRITIN:2,TIBC:2,IRON:2,RETICCTPCT:2 in the last 72 hours  Micro Results: Recent Results (from the past 240 hour(s))  URINE CULTURE     Status: Normal   Collection Time   06/18/11 12:09 PM      Component Value Range Status Comment   Specimen Description URINE, RANDOM   Final    Special Requests NONE   Final    Setup Time [id number]   Final    Colony Count >=100,000 COLONIES/ML   Final    Culture     Final    Value: GROUP B STREP(S.AGALACTIAE)ISOLATED     Note: TESTING AGAINST S. AGALACTIAE NOT ROUTINELY PERFORMED DUE TO PREDICTABILITY OF AMP/PEN/VAN SUSCEPTIBILITY.   Report Status 06/19/2011 FINAL   Final   CULTURE, BLOOD (ROUTINE X 2)     Status: Normal   Collection Time   06/21/11  1:51 AM      Component Value Range Status Comment   Specimen Description BLOOD RIGHT FOREARM   Final    Special Requests BOTTLES DRAWN AEROBIC AND ANAEROBIC 5 CC EA   Final    Setup Time [id number]   Final    Culture NO GROWTH 5 DAYS   Final    Report Status 1January 29, 202012 FINAL   Final   CULTURE, BLOOD (ROUTINE X 2)  Status: Normal   Collection Time   06/21/11  1:53 AM      Component Value Range Status Comment   Specimen Description BLOOD LEFT ANTECUBITAL   Final    Special Requests BOTTLES DRAWN AEROBIC AND ANAEROBIC 3 CC EA   Final    Setup Time [id number]   Final    Culture NO GROWTH 5 DAYS   Final    Report Status 06/27/2011 FINAL   Final   MRSA PCR SCREENING     Status: Normal   Collection Time   06/21/11  4:33 PM      Component Value Range Status Comment   MRSA by PCR NEGATIVE  NEGATIVE  Final     Studies/Results: No results found.  Medications: I have reviewed the patient's current medications. Scheduled Meds:    . antiseptic oral rinse  15 mL Mouth Rinse BID  . cefUROXime  500 mg Oral BID WC  . enoxaparin  110 mg Subcutaneous Q24H   . hydrochlorothiazide  25 mg Oral Daily  . ondansetron (ZOFRAN) IV  4 mg Intravenous Once  . sulfamethoxazole-trimethoprim  1 tablet Oral Q12H   Continuous Infusions:  PRN Meds:.hydrALAZINE, levalbuterol, oxyCODONE-acetaminophen  Assessment/Plan: 1. CELLULITIS, LEG.  Antibiotics change from vancomycin and Zosyn to cefuroxime and Bactrim on 06/26/2011.  Dopplers were obtained and were negative for DVT bilaterally.  2.  Scrotal swelling.  Ultrasound obtained on 06/21/2011 showed persistent marked scrotal soft tissue swelling measuring up to 10 cm in thickness.  Compatible with severe chronic or recurrent cellulitis.  No definite free air noted.  Large right epididymal cyst measuring 5.0 cm in size.  3. Morbid obesity.  Management as per outpatient.  4. OBESITY HYPOVENTILATION SYNDROME.  Continue CPAP at night.  Encourage compliance.  Dr. Vassie Loll with pulmonary evaluated the patient on 06/21/2011 and made recommendations for outpatient split night sleep study and followup with sleep M.D.  Given continued frequent desaturations pulmonary input was again requested for further evaluation.  Appreciate pulmonary input.  5. TOBACCO ABUSE.  Encouraged cessation.  6. OBSTRUCTIVE SLEEP APNEA.  Continue CPAP.  7. HYPERTENSION has been started on hydrochlorothiazide, given persistent elevation in blood pressure we'll increase hydrochlorothiazide dose.  8.  Prophylaxis Lovenox for DVT prophylaxis.  9.  Disposition.  Pending.  Consider possible discharge tomorrow once pulmonary issues, desaturation is improved.   LOS: 7 days  Ladine Kiper A, MD 06/27/2011, 3:21 PM

## 2011-06-27 NOTE — Progress Notes (Addendum)
On call provider notified of CO2 41 this AM. Pt's CPAP mask slides off his face easily and his O2 sats drop quickly to mid-70s. Provider notified. Will continue to monitor and follow provider's orders to r/o metabolic causes. Eugene Garnet [facility] 06/27/2011 5:40 AM

## 2011-06-27 NOTE — Progress Notes (Signed)
Paged provider on call about CBG results: CO2 64.2, O2 50.7, Bicarb 41.8 and Ph 7.42. RT advised to place pt on 50% venti mask, which was done. Will continue to monitor. Eugene Garnet [facility] 06/27/2011 6:44 AM

## 2011-06-28 MED ORDER — ALBUTEROL SULFATE HFA 108 (90 BASE) MCG/ACT IN AERS
1.0000 | INHALATION_SPRAY | Freq: Four times a day (QID) | RESPIRATORY_TRACT | Status: DC | PRN
  Filled 2011-06-28: qty 6.7

## 2011-06-28 MED ORDER — SULFAMETHOXAZOLE-TMP DS 800-160 MG PO TABS: 1.0000 | ORAL_TABLET | Freq: Two times a day (BID) | ORAL | Status: AC

## 2011-06-28 MED ORDER — OXYCODONE-ACETAMINOPHEN 5-325 MG PO TABS: 1.0000 | ORAL_TABLET | Freq: Four times a day (QID) | ORAL | Status: AC | PRN

## 2011-06-28 MED ORDER — HYDROCHLOROTHIAZIDE 50 MG PO TABS: 50.0000 mg | ORAL_TABLET | Freq: Every day | ORAL | Status: DC

## 2011-06-28 MED ORDER — ALBUTEROL SULFATE HFA 108 (90 BASE) MCG/ACT IN AERS: 1.0000 | INHALATION_SPRAY | Freq: Four times a day (QID) | RESPIRATORY_TRACT | Status: DC | PRN

## 2011-06-28 MED ORDER — CEFUROXIME AXETIL 500 MG PO TABS: 500.0000 mg | ORAL_TABLET | Freq: Two times a day (BID) | ORAL | Status: AC

## 2011-06-28 NOTE — Progress Notes (Signed)
Pt D/C home. Denies pain,pt in stable condition and appropriate for discharge.  D/C medication and instructions done, pt verbalizes understanding.

## 2011-06-28 NOTE — Discharge Summary (Signed)
Discharge Summary  Andre [name] MR#: [id number]  DOB:[birth year]  Date of Admission: 06/20/2011 Date of Discharge: 06/28/2011  Patient's PCP: No primary provider on file.  Attending Physician:Codee Tutson A  Consults: Treatment Team:  Anner Crete (Urology) Dr. Vassie Loll (Pulmonary)   Discharge Diagnoses: Principal Problem:  *CELLULITIS, LEG, LEFT Active Problems:  Morbid obesity  OBESITY HYPOVENTILATION SYNDROME  TOBACCO ABUSE  OBSTRUCTIVE SLEEP APNEA  HYPERTENSION, BORDERLINE  Brief Admitting History and Physical 30 year old male with morbid obesity and history of recurrent cellulitis presented on 06/21/2011 with right leg pain and scrotal pain.  Discharge Medications Current Discharge Medication List    START taking these medications   Details  albuterol (PROVENTIL HFA;VENTOLIN HFA) 108 (90 BASE) MCG/ACT inhaler Inhale 1-2 puffs into the lungs every 6 (six) hours as needed for wheezing or shortness of breath. Qty: 1 Inhaler, Refills: 0    cefUROXime (CEFTIN) 500 MG tablet Take 1 tablet (500 mg total) by mouth 2 (two) times daily with a meal. Qty: 12 tablet, Refills: 0    hydrochlorothiazide (HYDRODIURIL) 50 MG tablet Take 1 tablet (50 mg total) by mouth daily. Qty: 30 tablet, Refills: 0    oxyCODONE-acetaminophen (PERCOCET) 5-325 MG per tablet Take 1 tablet by mouth every 6 (six) hours as needed for pain. Qty: 10 tablet, Refills: 0    sulfamethoxazole-trimethoprim (BACTRIM DS) 800-160 MG per tablet Take 1 tablet by mouth every 12 (twelve) hours. Qty: 12 tablet, Refills: 0      STOP taking these medications     cephALEXin (KEFLEX) 500 MG capsule      doxycycline (VIBRAMYCIN) 100 MG capsule         Hospital Course: 1. Right lower extremity cellulitis.  Initially on admission patient was started on vancomycin and Zosyn antibiotics were transitioned to cefuroxime and Bactrim.  Patient will continue oral antibiotics for 6 more days to complete a 14 day  course of antibiotics. Dopplers were obtained and were negative for DVT bilaterally.  2.  Scrotal swelling.  Ultrasound obtained on 06/21/2011 showed persistent marked scrotal soft tissue swelling measuring up to 10 cm in thickness.  Compatible with severe chronic or recurrent cellulitis.  No definite free air noted.  Large right epididymal cyst measuring 5.0 cm in size. Dr. Annabell Howells with urology evaluated the patient and had a no further recommendations other than having his scrotum elevated however it is difficult to begin due to large pannus.  Patient would benefit from weight loss.  3. Morbid obesity.  Given morbid obesity the patient would benefit from outpatient management with diet and exercise.  If diet and exercise fails suspect the patient may need possible bariatric surgery, which can be directed under the care of patient's primary care physician.  4. OBESITY HYPOVENTILATION SYNDROME.  Continue CPAP at night which will be arranged for the patient at discharge.  Encourage compliance.  Dr. Vassie Loll with pulmonary evaluated the patient on 06/21/2011 and made recommendations for outpatient split night sleep study and followup with sleep M.D, which will be arranged at the time of discharge.  5. TOBACCO ABUSE.  Encouraged cessation.  6. OBSTRUCTIVE SLEEP APNEA.  Continue CPAP.  7. HYPERTENSION has been started on hydrochlorothiazide, given persistent elevation in blood pressure, continue HCTZ.  Day of Discharge BP 166/82  Pulse 76  Temp(Src) 96.6 F (35.9 C) (Axillary)  Resp 20  Ht 5\' 10"  (1.778 m)  Wt 275.8 kg (608 lb 0.5 oz)  BMI 87.24 kg/m2  SpO2 99%  Results for orders placed during the  hospital encounter of 06/20/11 (from the past 48 hour(s))  CBC     Status: Abnormal   Collection Time   06/27/11  3:35 AM      Component Value Range Comment   WBC 7.6  4.0 - 10.5 (K/uL)    RBC 5.16  4.22 - 5.81 (MIL/uL)    Hemoglobin 12.5 (*) 13.0 - 17.0 (g/dL)    HCT 16.1  09.6 - 04.5 (%)    MCV  82.9  78.0 - 100.0 (fL)    MCH 24.2 (*) 26.0 - 34.0 (pg)    MCHC 29.2 (*) 30.0 - 36.0 (g/dL)    RDW 40.9  81.1 - 91.4 (%)    Platelets 159  150 - 400 (K/uL)   BASIC METABOLIC PANEL     Status: Abnormal   Collection Time   06/27/11  3:35 AM      Component Value Range Comment   Sodium 137  135 - 145 (mEq/L)    Potassium 3.9  3.5 - 5.1 (mEq/L)    Chloride 94 (*) 96 - 112 (mEq/L)    CO2 41 (*) 19 - 32 (mEq/L)    Glucose, Bld 88  70 - 99 (mg/dL)    BUN 9  6 - 23 (mg/dL)    Creatinine, Ser 7.82  0.50 - 1.35 (mg/dL)    Calcium 9.6  8.4 - 10.5 (mg/dL)    GFR calc non Af Amer >90  >90 (mL/min)    GFR calc Af Amer >90  >90 (mL/min)   BLOOD GAS, ARTERIAL     Status: Abnormal   Collection Time   06/27/11  6:28 AM      Component Value Range Comment   FIO2 .21      pH, Arterial 7.429  7.350 - 7.450     pCO2 arterial 64.2 (*) 35.0 - 45.0 (mmHg)    pO2, Arterial 50.7 (*) 80.0 - 100.0 (mmHg)    Bicarbonate 41.8 (*) 20.0 - 24.0 (mEq/L)    TCO2 37.3  0 - 100 (mmol/L)    Acid-Base Excess 14.6 (*) 0.0 - 2.0 (mmol/L)    O2 Saturation 84.1      Patient temperature 98.6      Collection site LEFT RADIAL      Drawn by [id number]      Sample type ARTERIAL DRAW      Allens test (pass/fail) PASS  PASS      US Scrotum  06/21/2011  *RADIOLOGY REPORT*  Clinical Data: Scrotal swelling, with cellulitis.  Assess for soft tissue air.  ULTRASOUND OF SCROTUM  Technique:  Complete ultrasound examination of the testicles, epididymis, and other scrotal structures was performed.  Comparison:  Testicular ultrasound performed 01/17/2009  Findings:  There is marked scrotal soft tissue swelling, measuring up to 10 cm in thickness.  On correlation with prior studies and the clinical appearance, this is compatible with severe chronic or recurrent cellulitis, with worsened wall edema in comparison to the prior studies.  No definite reverberation artifact is seen to suggest free air. Scattered foci of very high echogenicity within  the scrotal wall edema appear to demonstrate posterior acoustic shadowing, raising question for chronic soft tissue calcifications.  Some of these appear stable from prior studies.  The testicles are symmetric in size and echogenicity.  The right testis measures 5.0 x 3.7 x 3.3 cm, while the left testis measures 4.5 x 2.8 x 2.8 cm.  No testicular masses are seen, and there is no evidence of microlithiasis.  A  large cyst is again noted in expected location of the right epididymis, measuring 5.0 x 4.8 x 4.8 cm.  This is mildly increased in size from prior studies.  The left epididymis is grossly unremarkable in appearance.  There is no evidence of hydrocele or varicocele.  IMPRESSION:  1.  Persistent marked scrotal soft tissue swelling, measuring up to 10 cm in thickness.  This has worsened in comparison to prior studies.  Given the clinical appearance, this is compatible with severe chronic or recurrent cellulitis. 2.  No definite evidence of free air.  Scattered foci of very high echogenicity within the scrotal wall edema appear to reflect chronic soft tissue calcifications.  Some of these appear stable from prior studies. 3.  Large right epididymal cyst again noted, measuring 5.0 cm in size.  Original Report Authenticated By: Tonia Ghent, M.D.   Dg Chest Port 1 View  06/24/2011  *RADIOLOGY REPORT*  Clinical Data: Wheezing.  Respiratory distress.  PORTABLE CHEST - 1 VIEW  Comparison: Chest x-ray 01/23/2009.  Findings: Examination is limited by body habitus.  The heart is enlarged but appears stable.  The mediastinal and hilar contours are quite prominent but unchanged.  There is marked vascular congestion and probable interstitial edema.  No large pleural effusions or obvious infiltrates.  The bony thorax is grossly intact.  IMPRESSION:  1.  Limited examination by body habitus and underpenetration. 2.  Cardiac enlargement and stable prominent mediastinal and hilar contours. 3.  Vascular congestion and possible  pulmonary edema.  Original Report Authenticated By: P. Loralie Champagne, M.D.     Disposition: Home with home health.  Diet: Heart healthy diet  Activity: Resume as tolerated as per physical therapy.   Follow-up Appts: Discharge Orders    Future Appointments: Provider: Department: Dept Phone: Center:   07/18/2011 8:00 PM [facility] [phone] [facility]     Future Orders Please Complete By Expires   Diet - low sodium heart healthy      Increase activity slowly      Discharge instructions      Comments:   Followup with [facility] as arranged. Please followup with outpatient sleep study.     Follow-up Information    Follow up with [facility] on 08/05/2011. (11AM)    Contact information:   [address] [place] [address] [phone]      Follow up with [facility] on 08/23/2011. (2:45P)    Contact information:   [address] [place] [address] [phone]         TESTS THAT NEED FOLLOW-UP None  Time spent on discharge, talking to the patient, and coordinating care: 40 mins.   Signed: Cristal Ford, MD 06/28/2011, 2:14 PM

## 2011-06-28 NOTE — Progress Notes (Signed)
Medication assistance given to pt for ABX. Pt plan to discharge home with family.  Sleep study appointment for 07/18/2011 at 2000.mp

## 2011-06-28 NOTE — Progress Notes (Signed)
Subjective: No specific complaints.  Patient was wondering if he can go home today.  Objective: Vital signs in last 24 hours: Filed Vitals:   06/28/11 0325 06/28/11 0457 06/28/11 0505 06/28/11 0639  BP:   166/82   Pulse: 74 80 71 76  Temp:   96.6 F (35.9 C)   TempSrc:   Axillary   Resp:   20   Height:      Weight:      SpO2: 90% 93% 96% 99%   Weight change:   Intake/Output Summary (Last 24 hours) at 06/28/11 1339 Last data filed at 06/28/11 0900  Gross per 24 hour  Intake    710 ml  Output      0 ml  Net    710 ml   Physical Exam: General: Awake, Oriented, No acute distress. HEENT: EOMI. Neck: Supple CV: S1 and S2 Lungs: Clear to ascultation bilaterally Abdomen: Soft, Nontender, Nondistended, +bowel sounds, morbid obesity with large pannus. Ext: Edema noted with cellulitis of leg with in margins, improved.  Lab Results:  Basename 06/27/11 0335 06/26/11 0330  NA 137 --  K 3.9 --  CL 94* --  CO2 41* --  GLUCOSE 88 --  BUN 9 --  CREATININE 0.62 0.57  CALCIUM 9.6 --  MG -- --  PHOS -- --   No results found for this basename: AST:2,ALT:2,ALKPHOS:2,BILITOT:2,PROT:2,ALBUMIN:2 in the last 72 hours No results found for this basename: LIPASE:2,AMYLASE:2 in the last 72 hours  Basename 06/27/11 0335 06/26/11 0330  WBC 7.6 8.3  NEUTROABS -- --  HGB 12.5* 12.1*  HCT 42.8 41.7  MCV 82.9 84.2  PLT 159 161   No results found for this basename: CKTOTAL:3,CKMB:3,CKMBINDEX:3,TROPONINI:3 in the last 72 hours No components found with this basename: POCBNP:3 No results found for this basename: DDIMER:2 in the last 72 hours No results found for this basename: HGBA1C:2 in the last 72 hours No results found for this basename: CHOL:2,HDL:2,LDLCALC:2,TRIG:2,CHOLHDL:2,LDLDIRECT:2 in the last 72 hours No results found for this basename: TSH,T4TOTAL,FREET3,T3FREE,THYROIDAB in the last 72 hours No results found for this basename:  VITAMINB12:2,FOLATE:2,FERRITIN:2,TIBC:2,IRON:2,RETICCTPCT:2 in the last 72 hours  Micro Results: Recent Results (from the past 240 hour(s))  CULTURE, BLOOD (ROUTINE X 2)     Status: Normal   Collection Time   06/21/11  1:51 AM      Component Value Range Status Comment   Specimen Description BLOOD RIGHT FOREARM   Final    Special Requests BOTTLES DRAWN AEROBIC AND ANAEROBIC 5 CC EA   Final    Setup Time [id number]   Final    Culture NO GROWTH 5 DAYS   Final    Report Status 06/27/2011 FINAL   Final   CULTURE, BLOOD (ROUTINE X 2)     Status: Normal   Collection Time   06/21/11  1:53 AM      Component Value Range Status Comment   Specimen Description BLOOD LEFT ANTECUBITAL   Final    Special Requests BOTTLES DRAWN AEROBIC AND ANAEROBIC 3 CC EA   Final    Setup Time [id number]   Final    Culture NO GROWTH 5 DAYS   Final    Report Status 06/27/2011 FINAL   Final   MRSA PCR SCREENING     Status: Normal   Collection Time   06/21/11  4:33 PM      Component Value Range Status Comment   MRSA by PCR NEGATIVE  NEGATIVE  Final     Studies/Results: No results  found.  Medications: I have reviewed the patient's current medications. Scheduled Meds:    . antiseptic oral rinse  15 mL Mouth Rinse BID  . cefUROXime  500 mg Oral BID WC  . enoxaparin  110 mg Subcutaneous Q24H  . hydrochlorothiazide  50 mg Oral Daily  . ondansetron (ZOFRAN) IV  4 mg Intravenous Once  . sulfamethoxazole-trimethoprim  1 tablet Oral Q12H  . DISCONTD: hydrochlorothiazide  25 mg Oral Daily   Continuous Infusions:  PRN Meds:.albuterol, hydrALAZINE, levalbuterol, oxyCODONE-acetaminophen  Assessment/Plan: 1. CELLULITIS, LEG.  Antibiotics change from vancomycin and Zosyn to cefuroxime and Bactrim on 06/26/2011.  Dopplers were obtained and were negative for DVT bilaterally.  2.  Scrotal swelling.  Ultrasound obtained on 06/21/2011 showed persistent marked scrotal soft tissue swelling measuring up to 10 cm in  thickness.  Compatible with severe chronic or recurrent cellulitis.  No definite free air noted.  Large right epididymal cyst measuring 5.0 cm in size.  3. Morbid obesity.  Management as per outpatient.  4. OBESITY HYPOVENTILATION SYNDROME.  Continue CPAP at night.  Encourage compliance.  Dr. Vassie Loll with pulmonary evaluated the patient on 06/21/2011 and made recommendations for outpatient split night sleep study and followup with sleep M.D.  5. TOBACCO ABUSE.  Encouraged cessation.  6. OBSTRUCTIVE SLEEP APNEA.  Continue CPAP.  7. HYPERTENSION has been started on hydrochlorothiazide, given persistent elevation in blood pressure, continue HCTZ.  8.  Prophylaxis Lovenox for DVT prophylaxis.  9.  Disposition.  Discharge patient home with home health.   LOS: 8 days  Griffen Frayne A, MD 06/28/2011, 1:39 PM

## 2011-06-28 NOTE — Progress Notes (Signed)
Physical Therapy Treatment Patient Details Name: Andre [name] MRN: [id number] DOB: July 15, 1981 Today's Date: 06/28/2011 [id number] 1TA, 1GT  PT Assessment/Plan  PT - Assessment/Plan Comments on Treatment Session: Appropritate for d/c home today.  Agree with ambulance transport due to intolerable to sit.  Will need HHPT for safety and improved mobility when tolerated PT Plan: Discharge plan remains appropriate PT Frequency:  (d/c home today) Follow Up Recommendations: Home health PT Equipment Recommended: None recommended by PT PT Goals  Acute Rehab PT Goals PT Goal: Supine/Side to Sit - Progress: Met PT Goal: Stand - Progress: Met PT Goal: Ambulate - Progress: Met PT Goal: Up/Down Stairs - Progress: Not met  PT Treatment Precautions/Restrictions  Precautions Precaution Comments: obesity Restrictions Weight Bearing Restrictions: No Mobility (including Balance) Bed Mobility Supine to Sit: 6: Modified independent (Device/Increase time) Transfers Sit to Stand: 6: Modified independent (Device/Increase time) Ambulation/Gait Ambulation/Gait Assistance: 6: Modified independent (Device/Increase time) Ambulation/Gait Assistance Details (indicate cue type and reason): holding onto objects in room due to pain Ambulation Distance (Feet): 15 Feet Gait Pattern: Trunk flexed (wide BOS)    Exercise  Other Exercises Other Exercises: applied stockinette and elastic bandage around scrotum and left thigh for support and edema control End of Session PT - End of Session Activity Tolerance: Patient limited by pain Patient left: in bed General Behavior During Session: [facility] for tasks performed Cognition: [facility] for tasks performed  [facility] 06/28/2011, 5:00 PM

## 2011-06-28 NOTE — Progress Notes (Signed)
Patient wears CPAP (nasal mask) at night with 6 liters Oxygen "bled" in. O2 sats remain between 86-95 % while sleeping. Only time sats drop <85% is when mask has become sl dislodged. Mask does not "fit" well on patient and at times seal is "broken". Patient on continuous pulse ox. Will monitor

## 2011-07-18 ENCOUNTER — Ambulatory Visit (HOSPITAL_BASED_OUTPATIENT_CLINIC_OR_DEPARTMENT_OTHER): Payer: Self-pay

## 2011-07-23 ENCOUNTER — Ambulatory Visit (HOSPITAL_BASED_OUTPATIENT_CLINIC_OR_DEPARTMENT_OTHER): Payer: Self-pay | Attending: Internal Medicine | Admitting: General Practice

## 2011-07-23 VITALS — Ht 70.0 in | Wt >= 6400 oz

## 2011-07-23 DIAGNOSIS — G4733 Obstructive sleep apnea (adult) (pediatric): Secondary | ICD-10-CM | POA: Insufficient documentation

## 2011-08-03 DIAGNOSIS — G4733 Obstructive sleep apnea (adult) (pediatric): Secondary | ICD-10-CM

## 2011-08-03 NOTE — Procedures (Signed)
NAME:  Andre [name], Andre [name] NO.:  [id number]  MEDICAL RECORD NO.:  [id number]          PATIENT TYPE:  OUT  LOCATION:  SLEEP CENTER                 FACILITY:  [facility]  PHYSICIAN:  Dickie Cloe D. Maple Hudson, MD, FCCP, FACPDATE OF BIRTH:  08/18/79  DATE OF STUDY:  07/23/2011                           NOCTURNAL POLYSOMNOGRAM  REFERRING PHYSICIAN:  Andreas Blower, MD  REFERRING PHYSICIAN:  Onnie Graham, NP  INDICATION FOR STUDY:  Hypersomnia with sleep apnea.  EPWORTH SLEEPINESS SCORE:  4/24.  BMI 71.7, weight 500 pounds, height 70 inches, neck 23 inches.  MEDICATIONS:  Charted and reviewed.  SLEEP ARCHITECTURE:  Split study protocol.  During the diagnostic phase, total sleep time 127 minutes with sleep efficiency 66.5%.  Stage I was 13.4% stage II 86.6%, stages III and REM were absent.  Sleep latency 42 minutes awake after sleep onset 22 minutes. Arousal index 41.6.  Bedtime medication: None.  RESPIRATORY DATA:  Split study protocol.  Apnea hypotonea index (AHI) 95 per hour.  A total of 201 events was scored including 22 obstructive apneas, 1 central apnea, 178 hypopneas.  Events were not positional. REM AHI NA.  CPAP was then titrated to 16 CWP, AHI 0 per hour.  He wore a small Respironics ComfortGel nasal mask with heated humidifier, supplemental oxygen and a C flex setting of 3.  OXYGEN DATA:  Moderate snoring before CPAP with oxygen desaturation to a nadir of 62% on room air.  With CPAP titration mean oxygen saturation held 86.3%.  Supplemental oxygen was added at 2 L/minute because of repeated desaturation.  Subsequent oxygen saturations held about 90% with final CPAP pressures and oxygen at 2 L.  Snoring was prevented by CPAP.  CARDIAC DATA:  Normal sinus rhythm.  MOVEMENT-PARASOMNIA:  No significant movement disturbance.  Bathroom x1.  IMPRESSIONS-RECOMMENDATIONS: 1. Severe obstructive sleep apnea/hypopnea syndrome, AHI 95 per hour     with non-positional  events.  Moderate snoring and oxygen     desaturation to a nadir of 62% on room air. 2. Successful CPAP titration to 16 CWP, AHI 0 per hour.  He wore a     small Respironics ComfortGel nasal mask with heated humidity and C     flex of 3. 3. Because of persistent oxygen desaturation, supplemental oxygen was     added at 2 L/minute through the CPAP.     Final CPAP pressures with 2 L of oxygen, mean oxygen saturation     appeared to be holding about 90% by graphic report.  Recommend     addition of oxygen in the home during sleep if clinically     appropriate.     Darolyn Double D. Maple Hudson, MD, [facility], FACP Diplomate, Biomedical engineer of Sleep Medicine Electronically Signed    CDY/MEDQ  D:  08/03/2011 09:05:41  T:  08/03/2011 09:30:34  Job:  [id number]

## 2015-01-13 ENCOUNTER — Encounter (HOSPITAL_COMMUNITY): Payer: Self-pay | Admitting: Neurology

## 2015-01-13 ENCOUNTER — Emergency Department (HOSPITAL_COMMUNITY)
Admit: 2015-01-13 | Discharge: 2015-01-13 | Disposition: A | Discharge status: Home / Self Care | Payer: Self-pay | Attending: Emergency Medicine | Admitting: Emergency Medicine

## 2015-01-13 ENCOUNTER — Inpatient Hospital Stay (HOSPITAL_COMMUNITY)
Admission: EM | Admit: 2015-01-13 | Discharge: 2015-01-16 | DRG: 603 | Disposition: A | Discharge status: Home / Self Care | Payer: Self-pay | Attending: Internal Medicine | Admitting: Internal Medicine

## 2015-01-13 DIAGNOSIS — E662 Morbid (severe) obesity with alveolar hypoventilation: Secondary | ICD-10-CM | POA: Diagnosis present

## 2015-01-13 DIAGNOSIS — I10 Essential (primary) hypertension: Secondary | ICD-10-CM | POA: Diagnosis present

## 2015-01-13 DIAGNOSIS — L0390 Cellulitis, unspecified: Secondary | ICD-10-CM | POA: Insufficient documentation

## 2015-01-13 DIAGNOSIS — L03116 Cellulitis of left lower limb: Principal | ICD-10-CM | POA: Diagnosis present

## 2015-01-13 DIAGNOSIS — M79609 Pain in unspecified limb: Secondary | ICD-10-CM

## 2015-01-13 DIAGNOSIS — Z6841 Body Mass Index (BMI) 40.0 and over, adult: Secondary | ICD-10-CM

## 2015-01-13 DIAGNOSIS — Z8249 Family history of ischemic heart disease and other diseases of the circulatory system: Secondary | ICD-10-CM

## 2015-01-13 DIAGNOSIS — F17210 Nicotine dependence, cigarettes, uncomplicated: Secondary | ICD-10-CM | POA: Diagnosis present

## 2015-01-13 DIAGNOSIS — D72829 Elevated white blood cell count, unspecified: Secondary | ICD-10-CM | POA: Insufficient documentation

## 2015-01-13 DIAGNOSIS — G4733 Obstructive sleep apnea (adult) (pediatric): Secondary | ICD-10-CM | POA: Diagnosis present

## 2015-01-13 DIAGNOSIS — F17200 Nicotine dependence, unspecified, uncomplicated: Secondary | ICD-10-CM | POA: Diagnosis present

## 2015-01-13 LAB — CBC WITH DIFFERENTIAL/PLATELET
BASOS ABS: 0 10*3/uL (ref 0.0–0.1)
Basophils Relative: 0 % (ref 0–1)
Eosinophils Absolute: 0 10*3/uL (ref 0.0–0.7)
Eosinophils Relative: 0 % (ref 0–5)
HCT: 49.7 % (ref 39.0–52.0)
HEMOGLOBIN: 17 g/dL (ref 13.0–17.0)
Lymphocytes Relative: 6 % — ABNORMAL LOW (ref 12–46)
Lymphs Abs: 2.3 10*3/uL (ref 0.7–4.0)
MCH: 30.5 pg (ref 26.0–34.0)
MCHC: 34.2 g/dL (ref 30.0–36.0)
MCV: 89.1 fL (ref 78.0–100.0)
MONO ABS: 1.6 10*3/uL — AB (ref 0.1–1.0)
MONOS PCT: 4 % (ref 3–12)
Neutro Abs: 35.1 10*3/uL — ABNORMAL HIGH (ref 1.7–7.7)
Neutrophils Relative %: 90 % — ABNORMAL HIGH (ref 43–77)
Platelets: 197 10*3/uL (ref 150–400)
RBC: 5.58 MIL/uL (ref 4.22–5.81)
RDW: 14.1 % (ref 11.5–15.5)
WBC: 39 10*3/uL — AB (ref 4.0–10.5)

## 2015-01-13 LAB — BASIC METABOLIC PANEL
Anion gap: 10 (ref 5–15)
BUN: 12 mg/dL (ref 6–20)
CALCIUM: 9.1 mg/dL (ref 8.9–10.3)
CO2: 28 mmol/L (ref 22–32)
Chloride: 100 mmol/L — ABNORMAL LOW (ref 101–111)
Creatinine, Ser: 0.8 mg/dL (ref 0.61–1.24)
GFR calc Af Amer: >60 mL/min (ref >60)
GLUCOSE: 94 mg/dL (ref 65–99)
Potassium: 4 mmol/L (ref 3.5–5.1)
Sodium: 138 mmol/L (ref 135–145)

## 2015-01-13 LAB — PROTIME-INR
INR: 1.12 (ref 0.00–1.49)
PROTHROMBIN TIME: 14.6 s (ref 11.6–15.2)

## 2015-01-13 LAB — APTT: aPTT: 23 seconds — ABNORMAL LOW (ref 24–37)

## 2015-01-13 MED ORDER — ENOXAPARIN SODIUM 120 MG/0.8ML ~~LOC~~ SOLN
110.0000 mg | SUBCUTANEOUS | Status: DC
  Administered 2015-01-14 – 2015-01-16 (×3): 110 mg via SUBCUTANEOUS
  Filled 2015-01-13 (×3): qty 0.8

## 2015-01-13 MED ORDER — ACETAMINOPHEN 325 MG PO TABS
650.0000 mg | ORAL_TABLET | Freq: Once | ORAL | Status: AC
  Administered 2015-01-13: 650 mg via ORAL
  Filled 2015-01-13: qty 2

## 2015-01-13 MED ORDER — CLINDAMYCIN PHOSPHATE 900 MG/50ML IV SOLN
900.0000 mg | Freq: Once | INTRAVENOUS | Status: AC
  Administered 2015-01-13: 900 mg via INTRAVENOUS
  Filled 2015-01-13: qty 50

## 2015-01-13 MED ORDER — ACETAMINOPHEN 650 MG RE SUPP: 650.0000 mg | Freq: Four times a day (QID) | RECTAL | Status: DC | PRN

## 2015-01-13 MED ORDER — SODIUM CHLORIDE 0.9 % IV SOLN: 250.0000 mL | INTRAVENOUS | Status: DC | PRN

## 2015-01-13 MED ORDER — ALUM & MAG HYDROXIDE-SIMETH 200-200-20 MG/5ML PO SUSP: 30.0000 mL | Freq: Four times a day (QID) | ORAL | Status: DC | PRN

## 2015-01-13 MED ORDER — SODIUM CHLORIDE 0.9 % IJ SOLN: 3.0000 mL | INTRAMUSCULAR | Status: DC | PRN

## 2015-01-13 MED ORDER — OXYCODONE HCL 5 MG PO TABS
5.0000 mg | ORAL_TABLET | ORAL | Status: DC | PRN
  Administered 2015-01-15 (×3): 5 mg via ORAL
  Filled 2015-01-13 (×4): qty 1

## 2015-01-13 MED ORDER — ACETAMINOPHEN 325 MG PO TABS: 650.0000 mg | ORAL_TABLET | Freq: Four times a day (QID) | ORAL | Status: DC | PRN

## 2015-01-13 MED ORDER — SODIUM CHLORIDE 0.9 % IV BOLUS (SEPSIS)
1000.0000 mL | Freq: Once | INTRAVENOUS | Status: AC
  Administered 2015-01-13: 1000 mL via INTRAVENOUS

## 2015-01-13 MED ORDER — ONDANSETRON HCL 4 MG/2ML IJ SOLN: 4.0000 mg | Freq: Four times a day (QID) | INTRAMUSCULAR | Status: DC | PRN

## 2015-01-13 MED ORDER — CLINDAMYCIN PHOSPHATE 600 MG/50ML IV SOLN
600.0000 mg | Freq: Three times a day (TID) | INTRAVENOUS | Status: DC
  Administered 2015-01-14: 600 mg via INTRAVENOUS
  Filled 2015-01-13 (×2): qty 50

## 2015-01-13 MED ORDER — ONDANSETRON HCL 4 MG PO TABS: 4.0000 mg | ORAL_TABLET | Freq: Four times a day (QID) | ORAL | Status: DC | PRN

## 2015-01-13 MED ORDER — HYDROMORPHONE HCL 1 MG/ML IJ SOLN: 0.5000 mg | INTRAMUSCULAR | Status: DC | PRN

## 2015-01-13 MED ORDER — SODIUM CHLORIDE 0.9 % IJ SOLN
3.0000 mL | Freq: Two times a day (BID) | INTRAMUSCULAR | Status: DC
  Administered 2015-01-13 – 2015-01-15 (×5): 3 mL via INTRAVENOUS

## 2015-01-13 NOTE — ED Notes (Signed)
Transporting patient to new room assignment. 

## 2015-01-13 NOTE — H&P (Signed)
[facility] Admission History and Physical       Andre [name]:[id number] DOB: 1981/04/04 DOA: 01/13/2015  Referring physician: EDP PCP: No PCP Per Patient  Specialists:   Chief Complaint: Pain and Swelling of Left Lower Leg  HPI: Andre [name] is a 34 y.o. male with a history of Morbid Obesity, OSA, and Obesity Hypoventilation Syndrome who presents to the ED with complaints of increased pain and swelling of his left lower leg  That started this afternoon following an airplane flight from [facility] today.  He was worried that he may have a blood clot in his leg.  He denies any chest pain or SOB, he also denies any fevers or chills.  He was evaluated in the ED and an Venous Duplex US was performed and was negative for a DVT.  He was placed on IV Antibiotic therapy for Cellulitis with IV Clindamycin, and he had a leukocytosis of 39k.     Review of Systems: Constitutional: No Weight Loss, No Weight Gain, Night Sweats, Fevers, Chills, Dizziness, Light Headedness, Fatigue, or Generalized Weakness HEENT: No Headaches, Difficulty Swallowing,Tooth/Dental Problems,Sore Throat,  No Sneezing, Rhinitis, Ear Ache, Nasal Congestion, or Post Nasal Drip,  Cardio-vascular:  No Chest pain, Orthopnea, PND, Edema in Lower Extremities, Anasarca, Dizziness, Palpitations  Resp: No Dyspnea, No DOE, No Productive Cough, No Non-Productive Cough, No Hemoptysis, No Wheezing.    GI: No Heartburn, Indigestion, Abdominal Pain, Nausea, Vomiting, Diarrhea, Constipation, Hematemesis, Hematochezia, Melena, Change in Bowel Habits,  Loss of Appetite  GU: No Dysuria, No Change in Color of Urine, No Urgency or Urinary Frequency, No Flank pain.  Musculoskeletal: +Pain and Swelling of Left Lower Leg, No Decreased Range of Motion, No Back Pain.  Neurologic: No Syncope, No Seizures, Muscle Weakness, Paresthesia, Vision Disturbance or Loss, No Diplopia, No Vertigo, No Difficulty Walking,  Skin: No Rash or  Lesions. Psych: No Change in Mood or Affect, No Depression or Anxiety, No Memory loss, No Confusion, or Hallucinations   Past Medical History  Diagnosis Date  . Sleep apnea   . Panniculitis     History of  . History of acute bronchitis with bronchospasm   . Cellulitis of scrotum     History of  . Anemia     iron deficient  . Hypertension   . CHF (congestive heart failure)   . Obstructive sleep apnea   . Obesity hypoventilation syndrome   . Morbid obesity   . Cellulitis     lower extremities     History reviewed. No pertinent past surgical history.    Prior to Admission medications   Medication Sig Start Date End Date Taking? Authorizing Provider  albuterol (PROVENTIL HFA;VENTOLIN HFA) 108 (90 BASE) MCG/ACT inhaler Inhale 1-2 puffs into the lungs every 6 (six) hours as needed for wheezing or shortness of breath. 06/28/11 06/27/12  Cristal Ford, MD  hydrochlorothiazide (HYDRODIURIL) 50 MG tablet Take 1 tablet (50 mg total) by mouth daily. 06/28/11 06/27/12  Cristal Ford, MD     No Known Allergies    Social History:  reports that he has been smoking.  He has never used smokeless tobacco. He reports that he drinks alcohol. He reports that he does not use illicit drugs.      Family History:     CAD in Mother   Cancer Unknown Source in Father      Physical Exam:  GEN:  Pleasant Morbidly Obese  34 y.o. Caucasian male examined and in no acute  distress; cooperative with exam Filed Vitals:   01/13/15 2230 01/13/15 2237 01/13/15 2245 01/13/15 2318  BP: 100/39 100/39 109/48   Pulse: 102 104 103 99  Temp:  101.3 F (38.5 C)  99.3 F (37.4 C)  TempSrc:  Oral  Oral  Resp: 21 25 25 20   Weight:      SpO2: 96% 95% 96% 97%   Blood pressure 109/48, pulse 99, temperature 99.3 F (37.4 C), temperature source Oral, resp. rate 20, weight 226.799 kg (500 lb), SpO2 97 %. PSYCH: He is alert and oriented x4; does not appear anxious does not appear depressed; affect is  normal HEENT: Normocephalic and Atraumatic, Mucous membranes pink; PERRLA; EOM intact; Fundi:  Benign;  No scleral icterus, Nares: Patent, Oropharynx: Clear,  Fair Dentition,    Neck:  FROM, No Cervical Lymphadenopathy nor Thyromegaly or Carotid Bruit; No JVD; Breasts:: Not examined CHEST WALL: No tenderness CHEST: Normal respiration, clear to auscultation bilaterally HEART: Regular rate and rhythm; no murmurs rubs or gallops BACK: No kyphosis or scoliosis; No CVA tenderness ABDOMEN: Positive Bowel Sounds,  Obese, Soft Non-Tender, No Rebound or Guarding; No Masses, No Organomegaly. Rectal Exam: Not done EXTREMITIES: No Cyanosis, Clubbing, + Erythema and 2-3+ Edema of LLE; No Ulcerations. Genitalia: not examined PULSES: 2+ and symmetric SKIN: Normal hydration no rash or ulceration CNS:  Alert and Oriented x 4, No Focal Deficits Vascular: pulses palpable throughout    Labs on Admission:  Basic Metabolic Panel:  Recent Labs Lab 01/13/15 1754  NA 138  K 4.0  CL 100*  CO2 28  GLUCOSE 94  BUN 12  CREATININE 0.80  CALCIUM 9.1   Liver Function Tests: No results for input(s): AST, ALT, ALKPHOS, BILITOT, PROT, ALBUMIN in the last 168 hours. No results for input(s): LIPASE, AMYLASE in the last 168 hours. No results for input(s): AMMONIA in the last 168 hours. CBC:  Recent Labs Lab 01/13/15 1754  WBC 39.0*  NEUTROABS 35.1*  HGB 17.0  HCT 49.7  MCV 89.1  PLT 197   Cardiac Enzymes: No results for input(s): CKTOTAL, CKMB, CKMBINDEX, TROPONINI in the last 168 hours.  BNP (last 3 results) No results for input(s): BNP in the last 8760 hours.  ProBNP (last 3 results) No results for input(s): PROBNP in the last 8760 hours.  CBG: No results for input(s): GLUCAP in the last 168 hours.  Radiological Exams on Admission: No results found.         Assessment/Plan:      34 y.o. male with  Principal Problem:   1.    Cellulitis of left leg   IV Clindamycin   Active  Problems:   2.   Morbid obesity   Needs Weight Loss     3.   TOBACCO ABUSE   Counseled   Nicotine Patch daily     4.   Obstructive sleep apnea   CPAP qhs    5.   Essential hypertension   Not on Meds Currently   Monitor BPs   IV Hydralazine PRN     6.   DVT Prophylaxis   Lovenox    Code Status:     FULL CODE    Family Communication:    No Family Present    Disposition Plan:    Inpatient Status        Time spent:  3860  Minutes      Ron [name],Andre Kohlmann C [facility] Pager [phone]   If 7AM -7PM Please Contact the Day Rounding Team  MD for [facility]  If 7PM-7AM, Please Contact Night-Floor Coverage  [email or web address] Password [email or web address] 01/13/2015, 11:22 PM     ADDENDUM:   Patient was seen and examined on 01/13/2015

## 2015-01-13 NOTE — Progress Notes (Signed)
*  PRELIMINARY RESULTS* Vascular Ultrasound Left lower extremity venous duplex has been completed.  Preliminary findings: Technically limited due to body habitus. No obvious DVT noted in left popliteal and posterior tibial veins, which were the only veins visualized. No SVT noted in the left GSV.   Enlarged left inguinal lymph node.   Farrel DemarkJill Eunice, RDMS, RVT  01/13/2015, 6:21 PM

## 2015-01-13 NOTE — ED Notes (Signed)
Pt reports recent air travel returning today, c/o left calf pain and swelling and pain to left groin.

## 2015-01-13 NOTE — ED Provider Notes (Signed)
CSN: [id number]     Arrival date & time 01/13/15  1738 History   First MD Initiated Contact with Patient 01/13/15 1912     Chief Complaint  Patient presents with  . Leg Pain     (Consider location/radiation/quality/duration/timing/severity/associated sxs/prior Treatment) Patient is a 34 y.o. male presenting with leg pain.  Leg Pain Location:  Leg Injury: no   Leg location:  L lower leg Pain details:    Quality:  Burning   Radiates to:  Does not radiate   Severity:  Severe   Onset quality:  Gradual   Duration:  8 hours   Timing:  Constant   Progression:  Worsening Relieved by:  Nothing Exacerbated by: touch. Ineffective treatments:  None tried Associated symptoms: swelling   Associated symptoms: no fever and no numbness     Past Medical History  Diagnosis Date  . Sleep apnea   . Panniculitis     History of  . History of acute bronchitis with bronchospasm   . Cellulitis of scrotum     History of  . Anemia     iron deficient  . Hypertension   . CHF (congestive heart failure)   . Obstructive sleep apnea   . Obesity hypoventilation syndrome   . Morbid obesity   . Cellulitis     lower extremities   History reviewed. No pertinent past surgical history. No family history on file. History  Substance Use Topics  . Smoking status: Current Every Day Smoker -- 1.00 packs/day for 9 years  . Smokeless tobacco: Never Used  . Alcohol Use: Yes    Review of Systems  Constitutional: Negative for fever.  All other systems reviewed and are negative.     Allergies  Review of patient's allergies indicates no known allergies.  Home Medications   Prior to Admission medications   Medication Sig Start Date End Date Taking? Authorizing Provider  albuterol (PROVENTIL HFA;VENTOLIN HFA) 108 (90 BASE) MCG/ACT inhaler Inhale 1-2 puffs into the lungs every 6 (six) hours as needed for wheezing or shortness of breath. 06/28/11 06/27/12  Cristal Ford, MD  hydrochlorothiazide  (HYDRODIURIL) 50 MG tablet Take 1 tablet (50 mg total) by mouth daily. 06/28/11 06/27/12  Cristal Ford, MD   BP 128/68 mmHg  Pulse 74  Temp(Src) 99.2 F (37.3 C) (Oral)  Resp 17  Wt 500 lb (226.799 kg)  SpO2 93% Physical Exam  Constitutional: He is oriented to person, place, and time. He appears well-developed and well-nourished. No distress.  Morbidly obese  HENT:  Head: Normocephalic and atraumatic.  Eyes: Conjunctivae are normal. No scleral icterus.  Neck: Neck supple.  Cardiovascular: Normal rate and intact distal pulses.   Pulmonary/Chest: Effort normal. No stridor. No respiratory distress.  Abdominal: Normal appearance. He exhibits no distension.  Neurological: He is alert and oriented to person, place, and time.  Skin: Skin is warm and dry. No rash noted.  Left calf is erythematous, warm, and tender.  No discrete masses.  Also has warmth and tenderness of a distinct area of his upper left leg pannus without fluctuant mass.    Psychiatric: He has a normal mood and affect. His behavior is normal.  Nursing note and vitals reviewed.   ED Course  Procedures (including critical care time) Labs Review Labs Reviewed  CBC WITH DIFFERENTIAL/PLATELET - Abnormal; Notable for the following:    WBC 39.0 (*)    Neutrophils Relative % 90 (*)    Lymphocytes Relative 6 (*)  Neutro Abs 35.1 (*)    Monocytes Absolute 1.6 (*)    All other components within normal limits  BASIC METABOLIC PANEL - Abnormal; Notable for the following:    Chloride 100 (*)    All other components within normal limits  APTT - Abnormal; Notable for the following:    aPTT 23 (*)    All other components within normal limits  PROTIME-INR    Imaging Review No results found.   EKG Interpretation None      MDM   Final diagnoses:  Cellulitis of left leg    34 yo male with left leg cellulitis.  Vascular study negative for DVT, obtained due to his recent air travel.  Has high leukocytosis.  Also has  no follow up.  He will need hospitalization and IV antibiotics.     Blake DivineJohn Arrabella Westerman, MD 01/13/15 2226

## 2015-01-14 DIAGNOSIS — I10 Essential (primary) hypertension: Secondary | ICD-10-CM

## 2015-01-14 DIAGNOSIS — G4733 Obstructive sleep apnea (adult) (pediatric): Secondary | ICD-10-CM

## 2015-01-14 DIAGNOSIS — L03116 Cellulitis of left lower limb: Principal | ICD-10-CM

## 2015-01-14 DIAGNOSIS — D72829 Elevated white blood cell count, unspecified: Secondary | ICD-10-CM

## 2015-01-14 LAB — BASIC METABOLIC PANEL
Anion gap: 9 (ref 5–15)
BUN: 12 mg/dL (ref 6–20)
CHLORIDE: 99 mmol/L — AB (ref 101–111)
CO2: 27 mmol/L (ref 22–32)
Calcium: 8.2 mg/dL — ABNORMAL LOW (ref 8.9–10.3)
Creatinine, Ser: 0.76 mg/dL (ref 0.61–1.24)
GFR calc Af Amer: >60 mL/min (ref >60)
GFR calc non Af Amer: >60 mL/min (ref >60)
Glucose, Bld: 86 mg/dL (ref 65–99)
Potassium: 4.1 mmol/L (ref 3.5–5.1)
SODIUM: 135 mmol/L (ref 135–145)

## 2015-01-14 LAB — CBC
HEMATOCRIT: 46.3 % (ref 39.0–52.0)
HEMOGLOBIN: 15.3 g/dL (ref 13.0–17.0)
MCH: 29.7 pg (ref 26.0–34.0)
MCHC: 33 g/dL (ref 30.0–36.0)
MCV: 89.9 fL (ref 78.0–100.0)
Platelets: 130 10*3/uL — ABNORMAL LOW (ref 150–400)
RBC: 5.15 MIL/uL (ref 4.22–5.81)
RDW: 14.4 % (ref 11.5–15.5)
WBC: 35.2 10*3/uL — AB (ref 4.0–10.5)

## 2015-01-14 MED ORDER — VANCOMYCIN HCL 10 G IV SOLR
1750.0000 mg | Freq: Three times a day (TID) | INTRAVENOUS | Status: DC
  Administered 2015-01-14: 1750 mg via INTRAVENOUS
  Filled 2015-01-14 (×3): qty 1750

## 2015-01-14 MED ORDER — CEFAZOLIN SODIUM-DEXTROSE 2-3 GM-% IV SOLR
2.0000 g | Freq: Three times a day (TID) | INTRAVENOUS | Status: DC
  Administered 2015-01-14 – 2015-01-16 (×6): 2 g via INTRAVENOUS
  Filled 2015-01-14 (×7): qty 50

## 2015-01-14 MED ORDER — MICONAZOLE NITRATE POWD
Freq: Two times a day (BID) | Status: DC
  Administered 2015-01-14 – 2015-01-16 (×5): via TOPICAL

## 2015-01-14 MED ORDER — LEVALBUTEROL HCL 0.63 MG/3ML IN NEBU
0.6300 mg | INHALATION_SOLUTION | Freq: Four times a day (QID) | RESPIRATORY_TRACT | Status: DC | PRN
  Administered 2015-01-14: 0.63 mg via RESPIRATORY_TRACT
  Filled 2015-01-14 (×3): qty 3

## 2015-01-14 NOTE — Progress Notes (Signed)
ANTIBIOTIC CONSULT NOTE - INITIAL  Pharmacy Consult for Vancomycin Indication: Cellulitis  No Known Allergies  Patient Measurements: Height: 5\' 10"  (177.8 cm) Weight: (!) 500 lb (226.799 kg) IBW/kg (Calculated) : 73 Adjusted Body Weight: 119 kg  Vital Signs: Temp: 99 F (37.2 C) (07/02 0452) Temp Source: Oral (07/02 0452) BP: 129/61 mmHg (07/02 0452) Pulse Rate: 79 (07/02 0452) Intake/Output from previous day: 07/01 0701 - 07/02 0700 In: 1000 [I.V.:1000] Out: -  Intake/Output from this shift:    Labs:  Recent Labs  01/13/15 1754 01/14/15 0425  WBC 39.0* 35.2*  HGB 17.0 15.3  PLT 197 130*  CREATININE 0.80 0.76   Estimated Creatinine Clearance: 247.5 mL/min (by C-G formula based on Cr of 0.76). No results for input(s): VANCOTROUGH, VANCOPEAK, VANCORANDOM, GENTTROUGH, GENTPEAK, GENTRANDOM, TOBRATROUGH, TOBRAPEAK, TOBRARND, AMIKACINPEAK, AMIKACINTROU, AMIKACIN in the last 72 hours.   Microbiology: No results found for this or any previous visit (from the past 720 hour(s)).  Medical History: Past Medical History  Diagnosis Date  . Sleep apnea   . Panniculitis     History of  . History of acute bronchitis with bronchospasm   . Cellulitis of scrotum     History of  . Anemia     iron deficient  . Hypertension   . CHF (congestive heart failure)   . Obstructive sleep apnea   . Obesity hypoventilation syndrome   . Morbid obesity   . Cellulitis     lower extremities    Medications:  Prescriptions prior to admission  Medication Sig Dispense Refill Last Dose  . albuterol (PROVENTIL HFA;VENTOLIN HFA) 108 (90 BASE) MCG/ACT inhaler Inhale 1-2 puffs into the lungs every 6 (six) hours as needed for wheezing or shortness of breath. 1 Inhaler 0   . hydrochlorothiazide (HYDRODIURIL) 50 MG tablet Take 1 tablet (50 mg total) by mouth daily. 30 tablet 0    Assessment: 34 y/o morbidly obese male with significant h/o cellulitis presents 7/1 with Pain and Swelling of Left  Lower Leg. US negative for DVT. Started Lovenox 0.5mg /kg/day with BMI>30. Note decline in plts overnight (197>130).  Labs: WBC 39>35.2 today, Scr 0.76, K=4.1. Plts 197>130 today,  Goal of Therapy:  Vancomycin trough level 10-15 mcg/ml  Plan:  Patient received 2 doses of IV Clinda in ED. Start Vancomycin 1750mg  IV q8hr as based off past dosing and levels. Vanco trough after 3-5 doses at steady state. F/u platelet decline.  Analycia Khokhar S. Merilynn Finlandobertson, PharmD, BCPS Clinical Staff Pharmacist Pager [phone]  Misty Stanleyobertson, Christan Defranco Stillinger 01/14/2015,8:31 AM

## 2015-01-14 NOTE — Progress Notes (Signed)
[facility] PROGRESS NOTE  Andre BridgemanDouglas L Mand ZOX:[id number] DOB: 1980-08-10 DOA: 01/13/2015 PCP: No PCP Per Patient  Assessment/Plan: #1 left lower extremity cellulitis Questionable etiology. Patient states clinical improvement since admission. Dopplers of the lower extremity negative for DVT. Blood cultures have been obtained and are pending. Patient with a significant leukocytosis of 35.2. Change IV clindamycin to IV Ancef. Pain management. Supportive care. Follow.  #2 leukocytosis Likely secondary to problem #1. Patient with no respiratory symptoms. Patient with no urinary symptoms. Blood cultures are pending. Continue empiric IV antibiotics.  #3 tobacco abuse Tobacco cessation. Continue nicotine patch.  #4 obstructive sleep apnea CPAP QHS.  #5 hypertension Stable. Follow for now.  #6 morbid obesity  #7 prophylaxis Lovenox for DVT prophylaxis.  Code Status: Full Family Communication: Updated patient. No family at bedside. Disposition Plan: Home when lower extremity cellulitis has improved and leukocytosis has significantly improved.   Consultants:  None  Procedures:  Left lower extremity Dopplers 01/13/2015  Antibiotics:  IV clindamycin 01/13/2015>>>>> 01/14/2015  IV Ancef 01/14/2015  IV vancomycin 01/14/2015>>>>> 01/14/2015  HPI/Subjective: Patient states left leg pain and swelling improving since admission. No complaints.  Objective: Filed Vitals:   01/14/15 0452  BP: 129/61  Pulse: 79  Temp: 99 F (37.2 C)  Resp: 20    Intake/Output Summary (Last 24 hours) at 01/14/15 1455 Last data filed at 01/14/15 1300  Gross per 24 hour  Intake   1480 ml  Output      0 ml  Net   1480 ml   Filed Weights   01/13/15 1745  Weight: 226.799 kg (500 lb)    Exam:   General:  NAD  Cardiovascular: RRR. Distant HS secondary to body habitus.  Respiratory: Clear to auscultation bilaterally anterior lung fields.  Abdomen: Obese, soft, nontender,  nondistended, positive bowel sounds.  Musculoskeletal: No clubbing cyanosis. Left lower extremity with some edema with erythema, warmth, tenderness to palpation.  Data Reviewed: Basic Metabolic Panel:  Recent Labs Lab 01/13/15 1754 01/14/15 0425  NA 138 135  K 4.0 4.1  CL 100* 99*  CO2 28 27  GLUCOSE 94 86  BUN 12 12  CREATININE 0.80 0.76  CALCIUM 9.1 8.2*   Liver Function Tests: No results for input(s): AST, ALT, ALKPHOS, BILITOT, PROT, ALBUMIN in the last 168 hours. No results for input(s): LIPASE, AMYLASE in the last 168 hours. No results for input(s): AMMONIA in the last 168 hours. CBC:  Recent Labs Lab 01/13/15 1754 01/14/15 0425  WBC 39.0* 35.2*  NEUTROABS 35.1*  --   HGB 17.0 15.3  HCT 49.7 46.3  MCV 89.1 89.9  PLT 197 130*   Cardiac Enzymes: No results for input(s): CKTOTAL, CKMB, CKMBINDEX, TROPONINI in the last 168 hours. BNP (last 3 results) No results for input(s): BNP in the last 8760 hours.  ProBNP (last 3 results) No results for input(s): PROBNP in the last 8760 hours.  CBG: No results for input(s): GLUCAP in the last 168 hours.  Recent Results (from the past 240 hour(s))  Blood culture (routine x 2)     Status: None (Preliminary result)   Collection Time: 01/13/15  9:00 PM  Result Value Ref Range Status   Specimen Description BLOOD LEFT ARM  Final   Special Requests BOTTLES DRAWN AEROBIC AND ANAEROBIC 5.5CC EACH  Final   Culture NO GROWTH < 24 HOURS  Final   Report Status PENDING  Incomplete  Blood culture (routine x 2)     Status: None (Preliminary result)  Collection Time: 01/13/15  9:16 PM  Result Value Ref Range Status   Specimen Description BLOOD LEFT HAND  Final   Special Requests BOTTLES DRAWN AEROBIC AND ANAEROBIC 5CC EACH  Final   Culture NO GROWTH < 24 HOURS  Final   Report Status PENDING  Incomplete     Studies: No results found.  Scheduled Meds: .  ceFAZolin (ANCEF) IV  2 g Intravenous 3 times per day  . enoxaparin  (LOVENOX) injection  110 mg Subcutaneous Q24H  . miconazole nitrate   Topical BID  . sodium chloride  3 mL Intravenous Q12H  . vancomycin  1,750 mg Intravenous Q8H   Continuous Infusions:   Principal Problem:   Cellulitis of left leg Active Problems:   Morbid obesity   TOBACCO ABUSE   Obstructive sleep apnea   Essential hypertension    Time spent: 35 mins    [facility] MD [facility] Pager [phone]. If 7PM-7AM, please contact night-coverage at [email or web address], password [facility] 01/14/2015, 2:55 PM  LOS: 1 day

## 2015-01-14 NOTE — Progress Notes (Signed)
UR Completed. Dalaina Tates, RN, BSN.  [phone] 

## 2015-01-14 NOTE — Progress Notes (Signed)
Patient advised RT that he didn't need any help applying CPAP but asked to have the water chamber and pressure increased.  RT filled the water chamber and changed the pressure to 12cmH2O.  RT will continue to monitor.

## 2015-01-15 DIAGNOSIS — Z720 Tobacco use: Secondary | ICD-10-CM

## 2015-01-15 LAB — CBC WITH DIFFERENTIAL/PLATELET
BASOS PCT: 0 % (ref 0–1)
Basophils Absolute: 0 10*3/uL (ref 0.0–0.1)
EOS ABS: 0.1 10*3/uL (ref 0.0–0.7)
EOS PCT: 1 % (ref 0–5)
HCT: 43.5 % (ref 39.0–52.0)
HEMOGLOBIN: 14.6 g/dL (ref 13.0–17.0)
LYMPHS ABS: 1.7 10*3/uL (ref 0.7–4.0)
Lymphocytes Relative: 11 % — ABNORMAL LOW (ref 12–46)
MCH: 30.3 pg (ref 26.0–34.0)
MCHC: 33.6 g/dL (ref 30.0–36.0)
MCV: 90.2 fL (ref 78.0–100.0)
MONOS PCT: 16 % — AB (ref 3–12)
Monocytes Absolute: 2.6 10*3/uL — ABNORMAL HIGH (ref 0.1–1.0)
Neutro Abs: 11.6 10*3/uL — ABNORMAL HIGH (ref 1.7–7.7)
Neutrophils Relative %: 72 % (ref 43–77)
Platelets: 121 10*3/uL — ABNORMAL LOW (ref 150–400)
RBC: 4.82 MIL/uL (ref 4.22–5.81)
RDW: 14.6 % (ref 11.5–15.5)
WBC: 16 10*3/uL — AB (ref 4.0–10.5)

## 2015-01-15 LAB — BASIC METABOLIC PANEL
Anion gap: 11 (ref 5–15)
BUN: 9 mg/dL (ref 6–20)
CALCIUM: 8.2 mg/dL — AB (ref 8.9–10.3)
CHLORIDE: 98 mmol/L — AB (ref 101–111)
CO2: 26 mmol/L (ref 22–32)
Creatinine, Ser: 0.68 mg/dL (ref 0.61–1.24)
GFR calc non Af Amer: >60 mL/min (ref >60)
Glucose, Bld: 88 mg/dL (ref 65–99)
POTASSIUM: 4 mmol/L (ref 3.5–5.1)
Sodium: 135 mmol/L (ref 135–145)

## 2015-01-15 LAB — HIV ANTIBODY (ROUTINE TESTING W REFLEX): HIV Screen 4th Generation wRfx: NONREACTIVE

## 2015-01-15 NOTE — Progress Notes (Signed)
[facility] PROGRESS NOTE  Andre BridgemanDouglas L Weiskopf ZOX:[id number] DOB: 1980/08/13 DOA: 01/13/2015 PCP: No PCP Per Patient  Assessment/Plan: #1 left lower extremity cellulitis Questionable etiology. Patient states clinical improvement since admission. Dopplers of the lower extremity negative for DVT. Blood cultures have been obtained and are pending. Patient with a significant leukocytosis of 35.2 on admission which has improved and currently at 16. Continue IV Ancef. If leukocytosis continues to improve with clinical improvement will transition to oral antibiotics tomorrow. Pain management. Supportive care. Follow.  #2 leukocytosis Likely secondary to problem #1. Patient with no respiratory symptoms. Patient with no urinary symptoms. Blood cultures are pending. Continue empiric IV antibiotics.  #3 tobacco abuse Tobacco cessation. Continue nicotine patch.  #4 obstructive sleep apnea CPAP QHS.  #5 hypertension Stable. Follow for now.  #6 morbid obesity  #7 prophylaxis Lovenox for DVT prophylaxis.  Code Status: Full Family Communication: Updated patient. No family at bedside. Disposition Plan: Home when lower extremity cellulitis has improved and leukocytosis has significantly improved.   Consultants:  None  Procedures:  Left lower extremity Dopplers 01/13/2015  Antibiotics:  IV clindamycin 01/13/2015>>>>> 01/14/2015  IV Ancef 01/14/2015  IV vancomycin 01/14/2015>>>>> 01/14/2015  HPI/Subjective: Patient states feeling better. Still with leg pain.  Objective: Filed Vitals:   01/15/15 0456  BP: 92/77  Pulse: 76  Temp: 97.5 F (36.4 C)  Resp: 20    Intake/Output Summary (Last 24 hours) at 01/15/15 1433 Last data filed at 01/15/15 0700  Gross per 24 hour  Intake    340 ml  Output      0 ml  Net    340 ml   Filed Weights   01/13/15 1745  Weight: 226.799 kg (500 lb)    Exam:   General:  NAD  Cardiovascular: RRR. Distant HS secondary to body  habitus.  Respiratory: Clear to auscultation bilaterally anterior lung fields.  Abdomen: Obese, soft, nontender, nondistended, positive bowel sounds.  Musculoskeletal: No clubbing cyanosis. Left lower extremity with decreased edema with erythema, warmth, tenderness to palpation.  Data Reviewed: Basic Metabolic Panel:  Recent Labs Lab 01/13/15 1754 01/14/15 0425 01/15/15 0659  NA 138 135 135  K 4.0 4.1 4.0  CL 100* 99* 98*  CO2 28 27 26   GLUCOSE 94 86 88  BUN 12 12 9   CREATININE 0.80 0.76 0.68  CALCIUM 9.1 8.2* 8.2*   Liver Function Tests: No results for input(s): AST, ALT, ALKPHOS, BILITOT, PROT, ALBUMIN in the last 168 hours. No results for input(s): LIPASE, AMYLASE in the last 168 hours. No results for input(s): AMMONIA in the last 168 hours. CBC:  Recent Labs Lab 01/13/15 1754 01/14/15 0425 01/15/15 0659  WBC 39.0* 35.2* 16.0*  NEUTROABS 35.1*  --  11.6*  HGB 17.0 15.3 14.6  HCT 49.7 46.3 43.5  MCV 89.1 89.9 90.2  PLT 197 130* 121*   Cardiac Enzymes: No results for input(s): CKTOTAL, CKMB, CKMBINDEX, TROPONINI in the last 168 hours. BNP (last 3 results) No results for input(s): BNP in the last 8760 hours.  ProBNP (last 3 results) No results for input(s): PROBNP in the last 8760 hours.  CBG: No results for input(s): GLUCAP in the last 168 hours.  Recent Results (from the past 240 hour(s))  Blood culture (routine x 2)     Status: None (Preliminary result)   Collection Time: 01/13/15  9:00 PM  Result Value Ref Range Status   Specimen Description BLOOD LEFT ARM  Final   Special Requests BOTTLES DRAWN AEROBIC AND ANAEROBIC  5.5CC EACH  Final   Culture NO GROWTH < 24 HOURS  Final   Report Status PENDING  Incomplete  Blood culture (routine x 2)     Status: None (Preliminary result)   Collection Time: 01/13/15  9:16 PM  Result Value Ref Range Status   Specimen Description BLOOD LEFT HAND  Final   Special Requests BOTTLES DRAWN AEROBIC AND ANAEROBIC 5CC EACH   Final   Culture NO GROWTH < 24 HOURS  Final   Report Status PENDING  Incomplete     Studies: No results found.  Scheduled Meds: .  ceFAZolin (ANCEF) IV  2 g Intravenous 3 times per day  . enoxaparin (LOVENOX) injection  110 mg Subcutaneous Q24H  . miconazole nitrate   Topical BID  . sodium chloride  3 mL Intravenous Q12H   Continuous Infusions:   Principal Problem:   Cellulitis of left leg Active Problems:   Morbid obesity   TOBACCO ABUSE   Obstructive sleep apnea   Essential hypertension   Leukocytosis    Time spent: 35 mins    [facility] MD [facility] Pager [phone]. If 7PM-7AM, please contact night-coverage at [email or web address], password [facility] 01/15/2015, 2:33 PM  LOS: 2 days

## 2015-01-15 NOTE — Progress Notes (Signed)
RT Note:  Pt places self on/off cpap.  Rt will continue to monitor. 

## 2015-01-16 LAB — BASIC METABOLIC PANEL
Anion gap: 7 (ref 5–15)
BUN: 10 mg/dL (ref 6–20)
CO2: 27 mmol/L (ref 22–32)
CREATININE: 0.69 mg/dL (ref 0.61–1.24)
Calcium: 8.2 mg/dL — ABNORMAL LOW (ref 8.9–10.3)
Chloride: 102 mmol/L (ref 101–111)
GLUCOSE: 106 mg/dL — AB (ref 65–99)
Potassium: 3.9 mmol/L (ref 3.5–5.1)
Sodium: 136 mmol/L (ref 135–145)

## 2015-01-16 LAB — CBC WITH DIFFERENTIAL/PLATELET
Basophils Absolute: 0 10*3/uL (ref 0.0–0.1)
Basophils Relative: 0 % (ref 0–1)
Eosinophils Absolute: 0.2 10*3/uL (ref 0.0–0.7)
Eosinophils Relative: 2 % (ref 0–5)
HCT: 42.4 % (ref 39.0–52.0)
Hemoglobin: 14.3 g/dL (ref 13.0–17.0)
LYMPHS ABS: 2.1 10*3/uL (ref 0.7–4.0)
Lymphocytes Relative: 19 % (ref 12–46)
MCH: 29.9 pg (ref 26.0–34.0)
MCHC: 33.7 g/dL (ref 30.0–36.0)
MCV: 88.5 fL (ref 78.0–100.0)
MONO ABS: 2.1 10*3/uL — AB (ref 0.1–1.0)
Monocytes Relative: 19 % — ABNORMAL HIGH (ref 3–12)
NEUTROS ABS: 6.6 10*3/uL (ref 1.7–7.7)
Neutrophils Relative %: 60 % (ref 43–77)
Platelets: 127 10*3/uL — ABNORMAL LOW (ref 150–400)
RBC: 4.79 MIL/uL (ref 4.22–5.81)
RDW: 14.3 % (ref 11.5–15.5)
WBC: 11 10*3/uL — ABNORMAL HIGH (ref 4.0–10.5)

## 2015-01-16 MED ORDER — CEPHALEXIN 500 MG PO CAPS
500.0000 mg | ORAL_CAPSULE | Freq: Four times a day (QID) | ORAL | Status: DC
  Administered 2015-01-16: 500 mg via ORAL
  Filled 2015-01-16: qty 1

## 2015-01-16 MED ORDER — MICONAZOLE NITRATE POWD: 1.0000 "application " | Freq: Two times a day (BID) | Status: DC

## 2015-01-16 MED ORDER — CEPHALEXIN 500 MG PO CAPS: 500.0000 mg | ORAL_CAPSULE | Freq: Four times a day (QID) | ORAL | Status: DC

## 2015-01-16 MED ORDER — OXYCODONE-ACETAMINOPHEN 5-325 MG PO TABS: 1.0000 | ORAL_TABLET | ORAL | Status: DC | PRN

## 2015-01-16 NOTE — Evaluation (Signed)
Physical Therapy Evaluation Patient Details Name: Andre BridgemanDouglas L Rios MRN: [id number] DOB: 09-04-1980 Today's Date: 01/16/2015   History of Present Illness  Cellulitis of Lt leg  Clinical Impression  Patient is making good progress with PT.  From a mobility standpoint anticipate patient will be ready for DC home when medically released. Patient has no equipment needs and denies any questions or concerns.        Follow Up Recommendations No PT follow up    Equipment Recommendations  None recommended by PT    Recommendations for Other Services       Precautions / Restrictions Precautions Precautions: Fall Restrictions Weight Bearing Restrictions: No      Mobility  Bed Mobility Overal bed mobility: Needs Assistance Bed Mobility: Supine to Sit     Supine to sit: Supervision        Transfers Overall transfer level: Needs assistance Equipment used: None Transfers: Sit to/from Stand Sit to Stand: Min guard            Ambulation/Gait Ambulation/Gait assistance: Min guard Ambulation Distance (Feet): 50 Feet Assistive device: None     Gait velocity interpretation: Below normal speed for age/gender    Stairs            Wheelchair Mobility    Modified Rankin (Stroke Patients Only)       Balance Overall balance assessment: Needs assistance Sitting-balance support: No upper extremity supported Sitting balance-Leahy Scale: Fair     Standing balance support: No upper extremity supported Standing balance-Leahy Scale: Fair                               Pertinent Vitals/Pain Pain Assessment: No/denies pain (no pain at rest but increasing pain with moving Lt leg )    Home Living Family/patient expects to be discharged to:: Private residence Living Arrangements: Non-relatives/Friends Available Help at Discharge: Friend(s) Type of Home: House Home Access: Level entry     Home Layout: One level Home Equipment: None      Prior  Function Level of Independence: Independent               Hand Dominance        Extremity/Trunk Assessment               Lower Extremity Assessment: Overall WFL for tasks assessed         Communication   Communication: No difficulties  Cognition Arousal/Alertness: Awake/alert Behavior During Therapy: WFL for tasks assessed/performed Overall Cognitive Status: Within Functional Limits for tasks assessed                      General Comments      Exercises        Assessment/Plan    PT Assessment Patient needs continued PT services  PT Diagnosis Difficulty walking   PT Problem List Decreased strength;Decreased activity tolerance;Decreased balance;Decreased mobility  PT Treatment Interventions Gait training;Functional mobility training;Therapeutic activities;Therapeutic exercise;Patient/family education   PT Goals (Current goals can be found in the Care Plan section) Acute Rehab PT Goals Patient Stated Goal: Wanting to get home today PT Goal Formulation: With patient Time For Goal Achievement: 01/30/15 Potential to Achieve Goals: Good    Frequency Min 3X/week   Barriers to discharge        Co-evaluation               End of Session Equipment Utilized During Treatment: Gait  belt Activity Tolerance: Patient limited by pain;Patient limited by fatigue Patient left: in chair;with call bell/phone within reach           Time: 0830-0856 PT Time Calculation (min) (ACUTE ONLY): 26 min   Charges:   PT Evaluation $Initial PT Evaluation Tier I: 1 Procedure PT Treatments $Gait Training: 8-22 mins   PT G Codes:        Christiane Ha, PT, CSCS Pager [phone] Office [phone]  01/16/2015, 1:17 PM

## 2015-01-16 NOTE — Care Management Note (Signed)
Case Management Note  Patient Details  Name: Andre [name] MRN: [id number] Date of Birth: Nov 28, 1980  Subjective/Objective:       Patient admitted with left leg cellulitis.               Action/Plan:  Case manager spoke with patient concerning need for PCP. Provided patient with several phone numbers to call after the holiday. Patient has no DME needs.    Expected Discharge Date: 01/16/2015                 Expected Discharge Plan: Home/self care In-House Referral:     Discharge planning Services     Post Acute Care Choice:    Choice offered to:     DME Arranged: NA DME Agency:     HH Arranged:  NA  HH Agency:     Status of Service:     Medicare Important Message Given:    Date Medicare IM Given:    Medicare IM give by:    Date Additional Medicare IM Given:    Additional Medicare Important Message give by:     If discussed at Long Length of Stay Meetings, dates discussed:    Additional Comments:  Durenda GuthrieBrady, Anner Baity Naomi, RN 01/16/2015, 11:14 AM

## 2015-01-16 NOTE — Discharge Summary (Signed)
Physician Discharge Summary  REG BIRCHER ZOX:[id number] DOB: February 20, 1981 DOA: 01/13/2015  PCP: No PCP Per Patient  Admit date: 01/13/2015 Discharge date: 01/16/2015  Time spent: 65 minutes  Recommendations for Outpatient Follow-up:  1. Patient has been given information to find a PCP. Patient is to follow-up with PCP one week post discharge patient will need a basic metabolic profile done to follow-up on electrolytes and renal function. Patient also need a CBC done.  Discharge Diagnoses:  Principal Problem:   Cellulitis of left leg Active Problems:   Morbid obesity   TOBACCO ABUSE   Obstructive sleep apnea   Essential hypertension   Leukocytosis   Discharge Condition: Stable and improved  Diet recommendation: Regular  Filed Weights   01/13/15 1745  Weight: 226.799 kg (500 lb)    History of present illness:  Per Dr Oralia Rud is a 34 y.o. male with a history of Morbid Obesity, OSA, and Obesity Hypoventilation Syndrome who presented to the ED with complaints of increased pain and swelling of his left lower leg That started the afternoon of admission, following an airplane flight from [facility]. He was worried that he may have a blood clot in his leg. He denied any chest pain or SOB, he also denied any fevers or chills. He was evaluated in the ED and an Venous Duplex US was performed and was negative for a DVT. He was placed on IV Antibiotic therapy for Cellulitis with IV Clindamycin, and he had a leukocytosis of 39k.   Hospital Course:  #1 left lower extremity cellulitis Questionable etiology. Patient was admitted with a left lower extremity cellulitis. Due to his recent travel from last vagus concern for DVT was entertained. Dopplers of the lower extremity negative for DVT. Blood cultures have been obtained and are pending. Patient was initially placed on IV clindamycin and subsequently transitioned to IV Ancef. Patient improved clinically and WBC trended  down from 35.2 on admission to Patient with a significant leukocytosis of 35.2 on admission and on day of discharge was down to 11.0. Patient will be transitioned to oral Keflex for 5 more days to complete a one-week course upon about it therapy. Patient will follow-up with PCP as outpatient.  #2 leukocytosis Likely secondary to problem #1. Patient with no respiratory symptoms. Patient with no urinary symptoms. Blood cultures are pending. Continue empiric IV antibiotics.  #3 tobacco abuse Tobacco cessation. Continue nicotine patch.  #4 obstructive sleep apnea CPAP QHS.  #5 hypertension Stable. Follow for now.  #6 morbid obesity  #7 prophylaxis Patient was maintained on Lovenox for DVT prophylaxis.  Procedures:  Left lower extremity Dopplers 01/13/2015  Consultations:  None  Discharge Exam: Filed Vitals:   01/15/15 1943  BP: 103/51  Pulse: 79  Temp: 99 F (37.2 C)  Resp:     General: NAD Cardiovascular: RRR Respiratory: CTAB Extremity: Decreased erythema, warmth, TTP in LLE.  Discharge Instructions   Discharge Instructions    Diet general    Complete by:  As directed      Discharge instructions    Complete by:  As directed   Follow up with PCP in 1 week.     Increase activity slowly    Complete by:  As directed           Current Discharge Medication List    START taking these medications   Details  cephALEXin (KEFLEX) 500 MG capsule Take 1 capsule (500 mg total) by mouth every 6 (six) hours. Take for  5 days then stop. Qty: 20 capsule, Refills: 0    miconazole nitrate (MICATIN) POWD Apply 1 application topically 2 (two) times daily. Use for 1 week. Qty: 25 g, Refills: 0    oxyCODONE-acetaminophen (ROXICET) 5-325 MG per tablet Take 1-2 tablets by mouth every 4 (four) hours as needed for severe pain. Qty: 20 tablet, Refills: 0      STOP taking these medications     albuterol (PROVENTIL HFA;VENTOLIN HFA) 108 (90 BASE) MCG/ACT inhaler       hydrochlorothiazide (HYDRODIURIL) 50 MG tablet        Allergies  Allergen Reactions  . Other Hives    Steroid, but cannot remember name.   Follow-up Information    Schedule an appointment as soon as possible for a visit in 1 week to follow up.   Why:  f/u with PCP in 1 week.       The results of significant diagnostics from this hospitalization (including imaging, microbiology, ancillary and laboratory) are listed below for reference.    Significant Diagnostic Studies: No results found.  Microbiology: Recent Results (from the past 240 hour(s))  Blood culture (routine x 2)     Status: None (Preliminary result)   Collection Time: 01/13/15  9:00 PM  Result Value Ref Range Status   Specimen Description BLOOD LEFT ARM  Final   Special Requests BOTTLES DRAWN AEROBIC AND ANAEROBIC 5.5CC EACH  Final   Culture NO GROWTH 2 DAYS  Final   Report Status PENDING  Incomplete  Blood culture (routine x 2)     Status: None (Preliminary result)   Collection Time: 01/13/15  9:16 PM  Result Value Ref Range Status   Specimen Description BLOOD LEFT HAND  Final   Special Requests BOTTLES DRAWN AEROBIC AND ANAEROBIC 5CC EACH  Final   Culture NO GROWTH 2 DAYS  Final   Report Status PENDING  Incomplete     Labs: Basic Metabolic Panel:  Recent Labs Lab 01/13/15 1754 01/14/15 0425 01/15/15 0659 01/16/15 0417  NA 138 135 135 136  K 4.0 4.1 4.0 3.9  CL 100* 99* 98* 102  CO2 28 27 26 27   GLUCOSE 94 86 88 106*  BUN 12 12 9 10   CREATININE 0.80 0.76 0.68 0.69  CALCIUM 9.1 8.2* 8.2* 8.2*   Liver Function Tests: No results for input(s): AST, ALT, ALKPHOS, BILITOT, PROT, ALBUMIN in the last 168 hours. No results for input(s): LIPASE, AMYLASE in the last 168 hours. No results for input(s): AMMONIA in the last 168 hours. CBC:  Recent Labs Lab 01/13/15 1754 01/14/15 0425 01/15/15 0659 01/16/15 0417  WBC 39.0* 35.2* 16.0* 11.0*  NEUTROABS 35.1*  --  11.6* 6.6  HGB 17.0 15.3 14.6 14.3   HCT 49.7 46.3 43.5 42.4  MCV 89.1 89.9 90.2 88.5  PLT 197 130* 121* 127*   Cardiac Enzymes: No results for input(s): CKTOTAL, CKMB, CKMBINDEX, TROPONINI in the last 168 hours. BNP: BNP (last 3 results) No results for input(s): BNP in the last 8760 hours.  ProBNP (last 3 results) No results for input(s): PROBNP in the last 8760 hours.  CBG: No results for input(s): GLUCAP in the last 168 hours.     SignedRamiro Harvest:  THOMPSON,DANIEL MD [facility] 01/16/2015, 12:12 PM

## 2015-01-16 NOTE — Progress Notes (Signed)
Pt resting, breathing noted.   

## 2015-01-18 LAB — CULTURE, BLOOD (ROUTINE X 2)
CULTURE: NO GROWTH
Culture: NO GROWTH

## 2017-12-08 ENCOUNTER — Emergency Department (HOSPITAL_BASED_OUTPATIENT_CLINIC_OR_DEPARTMENT_OTHER): Payer: Self-pay

## 2017-12-08 ENCOUNTER — Encounter (HOSPITAL_COMMUNITY): Payer: Self-pay | Admitting: *Deleted

## 2017-12-08 ENCOUNTER — Emergency Department (HOSPITAL_COMMUNITY)
Admission: EM | Admit: 2017-12-08 | Discharge: 2017-12-08 | Disposition: A | Discharge status: Home / Self Care | Payer: Self-pay | Attending: Emergency Medicine | Admitting: Emergency Medicine

## 2017-12-08 ENCOUNTER — Other Ambulatory Visit: Payer: Self-pay

## 2017-12-08 ENCOUNTER — Emergency Department (HOSPITAL_COMMUNITY): Payer: Self-pay

## 2017-12-08 DIAGNOSIS — I110 Hypertensive heart disease with heart failure: Secondary | ICD-10-CM | POA: Insufficient documentation

## 2017-12-08 DIAGNOSIS — L03116 Cellulitis of left lower limb: Secondary | ICD-10-CM | POA: Insufficient documentation

## 2017-12-08 DIAGNOSIS — M7989 Other specified soft tissue disorders: Secondary | ICD-10-CM

## 2017-12-08 DIAGNOSIS — M79609 Pain in unspecified limb: Secondary | ICD-10-CM

## 2017-12-08 DIAGNOSIS — F17200 Nicotine dependence, unspecified, uncomplicated: Secondary | ICD-10-CM | POA: Insufficient documentation

## 2017-12-08 DIAGNOSIS — I509 Heart failure, unspecified: Secondary | ICD-10-CM | POA: Insufficient documentation

## 2017-12-08 LAB — CBC WITH DIFFERENTIAL/PLATELET
Basophils Absolute: 0.1 10*3/uL (ref 0.0–0.1)
Basophils Relative: 1 %
EOS PCT: 2 %
Eosinophils Absolute: 0.2 10*3/uL (ref 0.0–0.7)
HCT: 47 % (ref 39.0–52.0)
Hemoglobin: 15.6 g/dL (ref 13.0–17.0)
LYMPHS ABS: 2.1 10*3/uL (ref 0.7–4.0)
Lymphocytes Relative: 20 %
MCH: 29.5 pg (ref 26.0–34.0)
MCHC: 33.2 g/dL (ref 30.0–36.0)
MCV: 88.8 fL (ref 78.0–100.0)
Monocytes Absolute: 1.3 10*3/uL — ABNORMAL HIGH (ref 0.1–1.0)
Monocytes Relative: 12 %
Neutro Abs: 6.8 10*3/uL (ref 1.7–7.7)
Neutrophils Relative %: 65 %
PLATELETS: 185 10*3/uL (ref 150–400)
RBC: 5.29 MIL/uL (ref 4.22–5.81)
RDW: 13.4 % (ref 11.5–15.5)
WBC: 10.5 10*3/uL (ref 4.0–10.5)

## 2017-12-08 LAB — COMPREHENSIVE METABOLIC PANEL
ALK PHOS: 103 U/L (ref 38–126)
ALT: 15 U/L — ABNORMAL LOW (ref 17–63)
ANION GAP: 11 (ref 5–15)
AST: 15 U/L (ref 15–41)
Albumin: 3.6 g/dL (ref 3.5–5.0)
BUN: 12 mg/dL (ref 6–20)
CO2: 27 mmol/L (ref 22–32)
Calcium: 8.8 mg/dL — ABNORMAL LOW (ref 8.9–10.3)
Chloride: 101 mmol/L (ref 101–111)
Creatinine, Ser: 0.77 mg/dL (ref 0.61–1.24)
GFR calc Af Amer: >60 mL/min (ref >60)
Glucose, Bld: 108 mg/dL — ABNORMAL HIGH (ref 65–99)
Potassium: 3.8 mmol/L (ref 3.5–5.1)
SODIUM: 139 mmol/L (ref 135–145)
Total Bilirubin: 0.5 mg/dL (ref 0.3–1.2)
Total Protein: 7.7 g/dL (ref 6.5–8.1)

## 2017-12-08 LAB — BRAIN NATRIURETIC PEPTIDE: B NATRIURETIC PEPTIDE 5: 19.7 pg/mL (ref 0.0–100.0)

## 2017-12-08 LAB — I-STAT CG4 LACTIC ACID, ED: Lactic Acid, Venous: 1.07 mmol/L (ref 0.5–1.9)

## 2017-12-08 MED ORDER — CEPHALEXIN 500 MG PO CAPS: 500.0000 mg | ORAL_CAPSULE | Freq: Four times a day (QID) | ORAL | 0 refills | Status: AC

## 2017-12-08 NOTE — Progress Notes (Signed)
Preliminary notes--Left lower extremity venous duplex exam completed. Negative for DVT where veins visualized. Very limited study on calf veins due to patient body habitus, could not completely exclude calf veins thrombosis.   Edema and prominent lymph nodes seen at left groin area. Result notified ED RN Marchelle Folks.   Hongying Richardson Dopp (RDMS RVT) 12/08/17 6:56 PM

## 2017-12-08 NOTE — ED Notes (Signed)
Patient transported to X-ray 

## 2017-12-08 NOTE — ED Provider Notes (Signed)
[facility]-EMERGENCY DEPT Provider Note   CSN: [id number] Arrival date & time: 12/08/17  1209     History   Chief Complaint Chief Complaint  Patient presents with  . Leg Swelling    Left    HPI Andre [name] is a 37 y.o. male.  The history is provided by the patient and medical records.  Leg Pain   This is a recurrent problem. The current episode started more than 1 week ago. The problem occurs constantly. The problem has been gradually worsening. The pain is present in the left lower leg. The quality of the pain is described as aching. The pain is at a severity of 3/10. The pain is mild. Pertinent negatives include no numbness. He has tried nothing for the symptoms. The treatment provided no relief. There has been no history of extremity trauma.    Past Medical History:  Diagnosis Date  . Anemia    iron deficient  . Cellulitis    lower extremities  . Cellulitis of scrotum    History of  . CHF (congestive heart failure) (HCC)   . History of acute bronchitis with bronchospasm   . Hypertension   . Morbid obesity (HCC)   . Obesity hypoventilation syndrome (HCC)   . Obstructive sleep apnea   . Panniculitis    History of  . Sleep apnea     Patient Active Problem List   Diagnosis Date Noted  . Leukocytosis   . Cellulitis of left leg 01/13/2015  . Cellulitis 01/13/2015  . TOBACCO ABUSE 03/23/2009  . EDEMA 03/23/2009  . Morbid obesity (HCC) 11/12/2008  . OBESITY HYPOVENTILATION SYNDROME 11/12/2008  . Obstructive sleep apnea 11/12/2008  . Essential hypertension 11/12/2008  . CELLULITIS, LEG, LEFT 11/12/2008    History reviewed. No pertinent surgical history.      Home Medications    Prior to Admission medications   Medication Sig Start Date End Date Taking? Authorizing Provider  cephALEXin (KEFLEX) 500 MG capsule Take 1 capsule (500 mg total) by mouth every 6 (six) hours. Take for 5 days then stop. 01/16/15   Rodolph Bong, MD    miconazole nitrate (MICATIN) POWD Apply 1 application topically 2 (two) times daily. Use for 1 week. 01/16/15   Rodolph Bong, MD  oxyCODONE-acetaminophen (ROXICET) 5-325 MG per tablet Take 1-2 tablets by mouth every 4 (four) hours as needed for severe pain. 01/16/15   Rodolph Bong, MD    Family History No family history on file.  Social History Social History   Tobacco Use  . Smoking status: Current Every Day Smoker    Packs/day: 1.00    Years: 9.00    Pack years: 9.00  . Smokeless tobacco: Never Used  Substance Use Topics  . Alcohol use: Yes  . Drug use: No     Allergies   Other   Review of Systems Review of Systems  Constitutional: Negative for chills, fatigue and fever.  Eyes: Negative for visual disturbance.  Respiratory: Negative for cough, choking, chest tightness, shortness of breath, wheezing and stridor.   Cardiovascular: Positive for leg swelling. Negative for chest pain and palpitations.  Gastrointestinal: Negative for abdominal pain, constipation, diarrhea, nausea and vomiting.  Genitourinary: Negative for frequency.  Musculoskeletal: Negative for back pain, neck pain and neck stiffness.  Skin: Positive for rash. Negative for wound.  Neurological: Negative for light-headedness, numbness and headaches.  Psychiatric/Behavioral: Negative for agitation.  All other systems reviewed and are negative.    Physical  Exam Updated Vital Signs BP (!) 181/98 (BP Location: Left Arm)   Pulse 87   Temp 97.9 F (36.6 C) (Oral)   Resp 16   Ht  (1.778 m)   Wt (!) 186 kg (410 lb)   SpO2 95%   BMI 58.83 kg/m   Physical Exam  Constitutional: He appears well-developed and well-nourished. No distress.  HENT:  Head: Normocephalic and atraumatic.  Nose: Nose normal.  Mouth/Throat: Oropharynx is clear and moist. No oropharyngeal exudate.  Eyes: Conjunctivae are normal.  Neck: Neck supple.  Cardiovascular: Normal rate and regular rhythm.  No murmur  heard. Pulmonary/Chest: Effort normal. No respiratory distress. He has wheezes (mild). He has no rales. He exhibits no tenderness.  Abdominal: Soft. There is no tenderness. There is no guarding.  Musculoskeletal: He exhibits edema and tenderness.       Left lower leg: He exhibits tenderness, swelling and edema. He exhibits no laceration.  Swelling, redness, and tenderness in the left lower leg just distal to the knee.  Palpable DP and PT pulse.  Normal sensation capillary refill and ankle strength.  Patient's left lower leg is markedly swollen compared to the right and is firm at its swelling.  No fluctuance appreciated.  Neurological: He is alert.  Skin: Skin is warm and dry. Capillary refill takes less than 2 seconds. He is not diaphoretic. There is erythema.  Psychiatric: He has a normal mood and affect.  Nursing note and vitals reviewed.    ED Treatments / Results  Labs (all labs ordered are listed, but only abnormal results are displayed) Labs Reviewed  CBC WITH DIFFERENTIAL/PLATELET - Abnormal; Notable for the following components:      Result Value   Monocytes Absolute 1.3 (*)    All other components within normal limits  COMPREHENSIVE METABOLIC PANEL - Abnormal; Notable for the following components:   Glucose, Bld 108 (*)    Calcium 8.8 (*)    ALT 15 (*)    All other components within normal limits  CULTURE, BLOOD (ROUTINE X 2)  CULTURE, BLOOD (ROUTINE X 2)  BRAIN NATRIURETIC PEPTIDE  I-STAT CG4 LACTIC ACID, ED    EKG None  Radiology Dg Tibia/fibula Left  Result Date: 12/08/2017 CLINICAL DATA:  Left lower leg swelling and discoloration. History of cellulitis. EXAM: LEFT TIBIA AND FIBULA - 2 VIEW COMPARISON:  None. FINDINGS: Soft tissue calcifications compatible with the history of cellulitis. Unremarkable bones. No bone destruction or periosteal reaction. Moderate calcaneal spur formation. IMPRESSION: No acute abnormality. Electronically Signed   By: Beckie Salts M.D.    On: 12/08/2017 16:59    Procedures Procedures (including critical care time)  Medications Ordered in ED Medications - No data to display   Initial Impression / Assessment and Plan / ED Course  I have reviewed the triage vital signs and the nursing notes.  Pertinent labs & imaging results that were available during my care of the patient were reviewed by me and considered in my medical decision making (see chart for details).     Andre [name] is a 37 y.o. male with a past medical history significant for hypertension, obesity, CHF, and prior history of leg cellulitis who presents with left leg pain, swelling, and redness.  Patient reports that several years ago he had to be admitted for cellulitis of the left leg that occurred after a medication he took.  He says that several weeks ago he traveled to [place] and since then has had recurrence of left  leg swelling pain and redness.  He denies history of DVT or PE however he is concerned about a clot.  He denies any chest pain, shortness of breath or palpitations.  He denies any nausea vomiting, fevers, chills, urinary symptoms or GI symptoms.  He denies trauma to the leg.  He says that although it is not as bad as several years ago with admission he wants to get checked out.  Patient denies any cuts scrapes or injuries to the leg.  He reports that the redness has worsened and the swelling is continued to worsen.  On exam, patient has no significant tenderness of the left knee and can range the knee normally.  He has tenderness and swelling to the left calf and shin just distal to the knee.  There is associated erythema and tenderness.  Patient does have a palpable DP and PT pulse and has normal sensation of the foot.  The erythema is primarily in the shin and not as much on the foot.  Patient's lungs have very faint wheezing which she reports is common for him is a smoker.  Chest and abdomen are nontender and patient otherwise appears  well.  Given the firmness of the swelling, patient will have an x-ray however DVT study will also be ordered.  Patient will have screening of her testing as last time the labs helped determine the extent of the patient's cellulitis.  Anticipate reassessment after work-up.  6:54 PM Diagnostic work-up overall reassuring.  X-ray showed no bony abnormality and no subcu gas.  Ultrasound showed no DVT.  Laboratory testing reassuring with normal BNP and no evidence of systemic infection with normal lactic acid and no leukocytosis.  Suspect mild cellulitis causing the swelling.  Patient will be started on antibiotics and follow-up with PCP in several days.  Patient understood return precautions and follow-up instructions.  Patient had no other questions or concerns and was discharged in good condition.  Final Clinical Impressions(s) / ED Diagnoses   Final diagnoses:  Left leg cellulitis    ED Discharge Orders        Ordered    cephALEXin (KEFLEX) 500 MG capsule  4 times daily     12/08/17 1856      Clinical Impression: 1. Left leg cellulitis     Disposition: Discharge  Condition: Good  I have discussed the results, Dx and Tx plan with the pt(& family if present). He/she/they expressed understanding and agree(s) with the plan. Discharge instructions discussed at great length. Strict return precautions discussed and pt &/or family have verbalized understanding of the instructions. No further questions at time of discharge.    New Prescriptions   CEPHALEXIN (KEFLEX) 500 MG CAPSULE    Take 1 capsule (500 mg total) by mouth 4 (four) times daily for 7 days.    Follow Up: [facility] [address] [place] [address] [phone] Schedule an appointment as soon as possible for a visit    [facility]GENC[facility] DEPT [address] [id number] mc [place] [address] [phone]        Koraline Phillipson, Canary Brim, MD 12/08/17 [phone]

## 2017-12-08 NOTE — ED Triage Notes (Signed)
Pt states he went out of town at the beginning of the month, left leg started to swell as it usually does when he travels. Swelling has not gone all the way down and is now sore. Lower leg discolored and swollen

## 2017-12-08 NOTE — Discharge Instructions (Signed)
Your work-up today did not show evidence of DVT, heart failure, or systemic infection however we suspect you have a cellulitis of the leg.  Please take the antibiotics and follow-up with a primary care physician.  If any symptoms change or worsen, please return to the nearest emergency department.

## 2017-12-13 LAB — CULTURE, BLOOD (ROUTINE X 2)
Culture: NO GROWTH
Culture: NO GROWTH

## 2018-01-13 ENCOUNTER — Ambulatory Visit (HOSPITAL_COMMUNITY)
Admission: EM | Admit: 2018-01-13 | Discharge: 2018-01-13 | Disposition: A | Discharge status: Home / Self Care | Payer: Medicare Other | Attending: Family Medicine | Admitting: Family Medicine

## 2018-01-13 ENCOUNTER — Encounter (HOSPITAL_COMMUNITY): Payer: Self-pay | Admitting: Emergency Medicine

## 2018-01-13 DIAGNOSIS — L03116 Cellulitis of left lower limb: Secondary | ICD-10-CM | POA: Diagnosis not present

## 2018-01-13 MED ORDER — STERILE WATER FOR INJECTION IJ SOLN
INTRAMUSCULAR | Status: AC
  Filled 2018-01-13: qty 10

## 2018-01-13 MED ORDER — CEFTRIAXONE SODIUM 1 G IJ SOLR
1.0000 g | Freq: Once | INTRAMUSCULAR | Status: AC
  Administered 2018-01-13: 1 g via INTRAMUSCULAR

## 2018-01-13 MED ORDER — CEFTRIAXONE SODIUM 1 G IJ SOLR
INTRAMUSCULAR | Status: AC
  Filled 2018-01-13: qty 10

## 2018-01-13 MED ORDER — CEFDINIR 300 MG PO CAPS: 300.0000 mg | ORAL_CAPSULE | Freq: Two times a day (BID) | ORAL | 0 refills | Status: AC

## 2018-01-13 MED ORDER — CEFDINIR 300 MG PO CAPS: 300.0000 mg | ORAL_CAPSULE | Freq: Two times a day (BID) | ORAL | 0 refills | Status: DC

## 2018-01-13 NOTE — Discharge Instructions (Signed)
We gave you a shot of Rocephin  Please begin omnicef twice daily for 1 week  Please return here go to emergency room if swelling worsening, worsening redness or pain.  Please return no more than 2 days if symptoms are not improving.

## 2018-01-13 NOTE — ED Triage Notes (Signed)
PT was treated for cellulitis to left leg a month ago. PT believes it is coming back in a skin fold on left leg.

## 2018-01-13 NOTE — ED Notes (Signed)
Bed: UC01 Expected date:  Expected time:  Means of arrival:  Comments: For appointments 

## 2018-01-13 NOTE — ED Provider Notes (Signed)
[facility]    CSN: [id number] Arrival date & time: 01/13/18  1214     History   Chief Complaint Chief Complaint  Patient presents with  . Rash    APPT    HPI Andre [name] is a [age] y.o. male history of hypertension, morbid obesity, CHF and frequent cellulitis presenting today for evaluation of cellulitis.  Patient states that his left leg and his lower abdomen have become increasingly swollen and red over the past 4 days.  He states this is very similar to when he has been treated for cellulitis in the past.  Patient does note [place] [facility] to symptoms starting.  Patient denies previous DVT/PE.  Patient is a smoker.  Patient states that he has had multiple ultrasounds in the past for similar symptoms and involvement negative.  He has had 2 admissions, one in 2016 and one in 2012 for cellulitis.  Patient does have a PCP, denies taking medicine for hypertension.  HPI  Past Medical History:  Diagnosis Date  . Anemia    iron deficient  . Cellulitis    lower extremities  . Cellulitis of scrotum    History of  . CHF (congestive heart failure) (HCC)   . History of acute bronchitis with bronchospasm   . Hypertension   . Morbid obesity (HCC)   . Obesity hypoventilation syndrome (HCC)   . Obstructive sleep apnea   . Panniculitis    History of  . Sleep apnea     Patient Active Problem List   Diagnosis Date Noted  . Leukocytosis   . Cellulitis of left leg 01/13/2015  . Cellulitis 01/13/2015  . TOBACCO ABUSE 03/23/2009  . EDEMA 03/23/2009  . Morbid obesity (HCC) 11/12/2008  . OBESITY HYPOVENTILATION SYNDROME 11/12/2008  . Obstructive sleep apnea 11/12/2008  . Essential hypertension 11/12/2008  . CELLULITIS, LEG, LEFT 11/12/2008    History reviewed. No pertinent surgical history.     Home Medications    Prior to Admission medications   Medication Sig Start Date End Date Taking? Authorizing Provider  miconazole nitrate  (MICATIN) POWD Apply 1 application topically 2 (two) times daily. Use for 1 week. 01/16/15  Yes Rodolph Bonghompson, Daniel V, MD  oxyCODONE-acetaminophen (ROXICET) 5-325 MG per tablet Take 1-2 tablets by mouth every 4 (four) hours as needed for severe pain. 01/16/15  Yes Rodolph Bonghompson, Daniel V, MD  cefdinir (OMNICEF) 300 MG capsule Take 1 capsule (300 mg total) by mouth 2 (two) times daily for 7 days. 01/13/18 01/20/18  Leea Rambeau, Junius CreamerHallie C, PA-C    Family History No family history on file.  Social History Social History   Tobacco Use  . Smoking status: Current Every Day Smoker    Packs/day: 1.00    Years: 9.00    Pack years: 9.00  . Smokeless tobacco: Never Used  Substance Use Topics  . Alcohol use: Yes  . Drug use: No     Allergies   Other   Review of Systems Review of Systems  Constitutional: Negative for fatigue and fever.  Eyes: Negative for redness, itching and visual disturbance.  Respiratory: Negative for shortness of breath.   Cardiovascular: Positive for leg swelling. Negative for chest pain.  Gastrointestinal: Negative for abdominal pain, nausea and vomiting.  Genitourinary: Negative for difficulty urinating, dysuria and frequency.  Musculoskeletal: Negative for arthralgias and myalgias.  Skin: Positive for color change. Negative for rash and wound.  Neurological: Negative for dizziness, syncope, weakness, light-headedness and headaches.  Physical Exam Triage Vital Signs ED Triage Vitals  Enc Vitals Group     BP 01/13/18 1228 (!) 195/101     Pulse Rate 01/13/18 1228 86     Resp 01/13/18 1228 16     Temp 01/13/18 1228 98.7 F (37.1 C)     Temp Source 01/13/18 1228 Oral     SpO2 01/13/18 1228 96 %     Weight 01/13/18 1227 (!) 410 lb (186 kg)     Height --      Head Circumference --      Peak Flow --      Pain Score 01/13/18 1227 6     Pain Loc --      Pain Edu? --      Excl. in GC? --    No data found.  Updated Vital Signs BP (!) 195/101 (BP Location: Right Wrist)    Pulse 86   Temp 98.7 F (37.1 C) (Oral)   Resp 16   Wt (!) 410 lb (186 kg)   SpO2 96%   BMI 58.83 kg/m   Visual Acuity Right Eye Distance:   Left Eye Distance:   Bilateral Distance:    Right Eye Near:   Left Eye Near:    Bilateral Near:     Physical Exam  Constitutional: He appears well-developed and well-nourished.  HENT:  Head: Normocephalic and atraumatic.  Eyes: Conjunctivae are normal.  Neck: Neck supple.  Cardiovascular: Normal rate and regular rhythm.  No murmur heard. Pulmonary/Chest: Effort normal and breath sounds normal. No respiratory distress.  Abdominal: Soft. There is no tenderness.  Musculoskeletal: He exhibits edema and tenderness.  Moderate erythema, swelling and induration to left lower extremity as well as panniculus  Neurological: He is alert.  Skin: Skin is warm and dry. There is erythema.  Psychiatric: He has a normal mood and affect.  Nursing note and vitals reviewed.    UC Treatments / Results  Labs (all labs ordered are listed, but only abnormal results are displayed) Labs Reviewed - No data to display  EKG None  Radiology No results found.  Procedures Procedures (including critical care time)  Medications Ordered in UC Medications  cefTRIAXone (ROCEPHIN) injection 1 g (1 g Intramuscular Given 01/13/18 1306)    Initial Impression / Assessment and Plan / UC Course  I have reviewed the triage vital signs and the nursing notes.  Pertinent labs & imaging results that were available during my care of the patient were reviewed by me and considered in my medical decision making (see chart for details).    Discussed with Dr. Tracie Harrier. Patient appears to have panniculitis as well as cellulitis of lower extremity.  Discussed with patient about concern for DVT.  Given this also involved the panniculus in a very similar characteristic, will go ahead and treat for cellulitis, will provide a gram of Rocephin as well as sent home with Omnicef to  treat cellulitis.  Advised him to monitor redness, swelling and pain, follow-up in no more than 2 days if symptoms not having any improvement.  Go to emergency room if symptoms worsening.Discussed strict return precautions. Patient verbalized understanding and is agreeable with plan.  Final Clinical Impressions(s) / UC Diagnoses   Final diagnoses:  Cellulitis of left lower extremity     Discharge Instructions     We gave you a shot of Rocephin  Please begin omnicef twice daily for 1 week  Please return here go to emergency room if swelling worsening, worsening redness  or pain.  Please return no more than 2 days if symptoms are not improving.   ED Prescriptions    Medication Sig Dispense Auth. Provider   cefdinir (OMNICEF) 300 MG capsule  (Status: Discontinued) Take 1 capsule (300 mg total) by mouth 2 (two) times daily for 7 days. 14 capsule Jameka Ivie C, PA-C   cefdinir (OMNICEF) 300 MG capsule Take 1 capsule (300 mg total) by mouth 2 (two) times daily for 7 days. 14 capsule Niema Carrara C, PA-C     Controlled Substance Prescriptions [place] Controlled Substance Registry consulted? Not Applicable   Lew Dawes, [place] 01/13/18 1411

## 2018-01-18 ENCOUNTER — Encounter (HOSPITAL_COMMUNITY): Payer: Self-pay | Admitting: *Deleted

## 2018-01-18 ENCOUNTER — Other Ambulatory Visit: Payer: Self-pay

## 2018-01-18 DIAGNOSIS — F17200 Nicotine dependence, unspecified, uncomplicated: Secondary | ICD-10-CM | POA: Diagnosis not present

## 2018-01-18 DIAGNOSIS — R2242 Localized swelling, mass and lump, left lower limb: Secondary | ICD-10-CM | POA: Diagnosis present

## 2018-01-18 DIAGNOSIS — L03311 Cellulitis of abdominal wall: Secondary | ICD-10-CM | POA: Insufficient documentation

## 2018-01-18 DIAGNOSIS — Z79899 Other long term (current) drug therapy: Secondary | ICD-10-CM | POA: Insufficient documentation

## 2018-01-18 DIAGNOSIS — I110 Hypertensive heart disease with heart failure: Secondary | ICD-10-CM | POA: Diagnosis not present

## 2018-01-18 DIAGNOSIS — I509 Heart failure, unspecified: Secondary | ICD-10-CM | POA: Diagnosis not present

## 2018-01-18 NOTE — ED Triage Notes (Signed)
Pt arrives via POV with c/o cellulitis. He says he was treated with abx for left leg cellulitis in May, feels like is was not completely resolved. Seen again for the same last week at UC, given new abx. Pt says he feels like the celluitis is now in his right scrotum because of swelling (has had scrotal cellulitis previously). The patient denies fevers.

## 2018-01-19 ENCOUNTER — Emergency Department (HOSPITAL_COMMUNITY)
Admission: EM | Admit: 2018-01-19 | Discharge: 2018-01-19 | Disposition: A | Discharge status: Home / Self Care | Payer: Medicare Other | Attending: Emergency Medicine | Admitting: Emergency Medicine

## 2018-01-19 DIAGNOSIS — L03311 Cellulitis of abdominal wall: Secondary | ICD-10-CM

## 2018-01-19 MED ORDER — CEPHALEXIN 500 MG PO CAPS
1000.0000 mg | ORAL_CAPSULE | Freq: Once | ORAL | Status: AC
  Administered 2018-01-19: 1000 mg via ORAL
  Filled 2018-01-19: qty 2

## 2018-01-19 MED ORDER — SULFAMETHOXAZOLE-TRIMETHOPRIM 800-160 MG PO TABS
1.0000 | ORAL_TABLET | Freq: Once | ORAL | Status: AC
  Administered 2018-01-19: 1 via ORAL
  Filled 2018-01-19: qty 1

## 2018-01-19 MED ORDER — SULFAMETHOXAZOLE-TRIMETHOPRIM 800-160 MG PO TABS: 1.0000 | ORAL_TABLET | Freq: Two times a day (BID) | ORAL | 0 refills | Status: DC

## 2018-01-19 MED ORDER — CEPHALEXIN 500 MG PO CAPS: 500.0000 mg | ORAL_CAPSULE | Freq: Four times a day (QID) | ORAL | 0 refills | Status: DC

## 2018-01-19 NOTE — ED Provider Notes (Addendum)
[facility]EMERGENCY [facility]PT Provider Note   CSN: [id number] Arrival date & time: 01/18/18  2303     History   Chief Complaint Chief Complaint  Patient presents with  . Groin Swelling    HPI CHACE KLIPPEL is a 37 y.o. male.  37 yo M with a chief complaint of recurrent skin infection.  The patient has had multiple episodes where he ends up getting swelling in erythema to his left leg as well as his lower abdomen.  He had been admitted twice in the past for this.  Was seen in urgent care about a week ago.  Each time he seems to have recently traveled to [facility].  He denies hot tub use.  He has been in the pool.  Denies fever or nausea.  The patient has been taking Omnicef had some improvement of his leg but feels that his abdomen is now more swollen.  He denies drainage.  The history is provided by the patient.  Illness  This is a new problem. The current episode started more than 1 week ago. The problem occurs constantly. The problem has not changed since onset.Associated symptoms include abdominal pain. Pertinent negatives include no chest pain, no headaches and no shortness of breath. Nothing aggravates the symptoms. Nothing relieves the symptoms. He has tried nothing for the symptoms. The treatment provided no relief.    Past Medical History:  Diagnosis Date  . Anemia    iron deficient  . Cellulitis    lower extremities  . Cellulitis of scrotum    History of  . CHF (congestive heart failure) (HCC)   . History of acute bronchitis with bronchospasm   . Hypertension   . Morbid obesity (HCC)   . Obesity hypoventilation syndrome (HCC)   . Obstructive sleep apnea   . Panniculitis    History of  . Sleep apnea     Patient Active Problem List   Diagnosis Date Noted  . Leukocytosis   . Cellulitis of left leg 01/13/2015  . Cellulitis 01/13/2015  . TOBACCO ABUSE 03/23/2009  . EDEMA 03/23/2009  . Morbid obesity (HCC) 11/12/2008  . OBESITY  HYPOVENTILATION SYNDROME 11/12/2008  . Obstructive sleep apnea 11/12/2008  . Essential hypertension 11/12/2008  . CELLULITIS, LEG, LEFT 11/12/2008    History reviewed. No pertinent surgical history.      Home Medications    Prior to Admission medications   Medication Sig Start Date End Date Taking? Authorizing Provider  cefdinir (OMNICEF) 300 MG capsule Take 1 capsule (300 mg total) by mouth 2 (two) times daily for 7 days. 01/13/18 01/20/18 Yes Wieters, Hallie C, PA-C  cephALEXin (KEFLEX) 500 MG capsule Take 1 capsule (500 mg total) by mouth 4 (four) times daily. 01/19/18   Melene Plan, DO  miconazole nitrate (MICATIN) POWD Apply 1 application topically 2 (two) times daily. Use for 1 week. Patient not taking: Reported on 01/19/2018 01/16/15   Rodolph Bong, MD  oxyCODONE-acetaminophen (ROXICET) 5-325 MG per tablet Take 1-2 tablets by mouth every 4 (four) hours as needed for severe pain. Patient not taking: Reported on 01/19/2018 01/16/15   Rodolph Bong, MD  sulfamethoxazole-trimethoprim (BACTRIM DS,SEPTRA DS) 800-160 MG tablet Take 1 tablet by mouth 2 (two) times daily for 7 days. 01/19/18 01/26/18  Melene Plan, DO    Family History No family history on file.  Social History Social History   Tobacco Use  . Smoking status: Current Every Day Smoker    Packs/day: 1.00  Years: 9.00    Pack years: 9.00  . Smokeless tobacco: Never Used  Substance Use Topics  . Alcohol use: Yes  . Drug use: No     Allergies   Other   Review of Systems Review of Systems  Constitutional: Negative for chills and fever.  HENT: Negative for congestion and facial swelling.   Eyes: Negative for discharge and visual disturbance.  Respiratory: Negative for shortness of breath.   Cardiovascular: Negative for chest pain and palpitations.  Gastrointestinal: Positive for abdominal pain. Negative for diarrhea and vomiting.  Genitourinary: Positive for scrotal swelling.  Musculoskeletal: Negative for  arthralgias and myalgias.  Skin: Negative for color change and rash.  Neurological: Negative for tremors, syncope and headaches.  Psychiatric/Behavioral: Negative for confusion and dysphoric mood.     Physical Exam Updated Vital Signs BP (!) 194/108 (BP Location: Right Arm)   Pulse 100   Temp 98 F (36.7 C) (Oral)   Resp 18   SpO2 92%   Physical Exam  Constitutional: He is oriented to person, place, and time. He appears well-developed and well-nourished.  Morbidly obese  HENT:  Head: Normocephalic and atraumatic.  Eyes: Pupils are equal, round, and reactive to light. EOM are normal.  Neck: Normal range of motion. Neck supple. No JVD present.  Cardiovascular: Normal rate and regular rhythm. Exam reveals no gallop and no friction rub.  No murmur heard. Pulmonary/Chest: No respiratory distress. He has no wheezes.  Abdominal: He exhibits no distension and no mass. There is no tenderness. There is no rebound and no guarding.  Erythema and chronic skin changes to the lower abdomen.  Mildly tender to palpation.  No fluctuance.  Extensions of edema into the scrotum.  Palpated testicles without any tenderness.  No hernias.  Musculoskeletal: Normal range of motion.  Neurological: He is alert and oriented to person, place, and time.  Skin: No rash noted. No pallor.  Psychiatric: He has a normal mood and affect. His behavior is normal.  Nursing note and vitals reviewed.    ED Treatments / Results  Labs (all labs ordered are listed, but only abnormal results are displayed) Labs Reviewed - No data to display  EKG None  Radiology No results found.  Procedures Procedures (including critical care time)  Medications Ordered in ED Medications  sulfamethoxazole-trimethoprim (BACTRIM DS,SEPTRA DS) 800-160 MG per tablet 1 tablet (has no administration in time range)  cephALEXin (KEFLEX) capsule 1,000 mg (has no administration in time range)     Initial Impression / Assessment and  Plan / ED Course  I have reviewed the triage vital signs and the nursing notes.  Pertinent labs & imaging results that were available during my care of the patient were reviewed by me and considered in my medical decision making (see chart for details).     37 yo M with a chief complaint of pain and swelling to his lower abdomen that extends into his testicles.  Patient has had this before.  This [facility] to [facility] [place] [facility] [place]  I will treat for cellulitis.  He is not showing any systemic symptoms and so I will change his antibiotics.  1:11 AM:  I have discussed the diagnosis/risks/treatment options with the patient and believe the pt to be eligible for discharge home to follow-up with PCP. We also discussed returning to the ED immediately if new or worsening sx occur. We discussed the sx which are most concerning (e.g., sudden worsening pain, fever, inability to tolerate  by mouth) that necessitate immediate return. Medications administered to the patient during their visit and any new prescriptions provided to the patient are listed below.  Medications given during this visit Medications  sulfamethoxazole-trimethoprim (BACTRIM DS,SEPTRA DS) 800-160 MG per tablet 1 tablet (has no administration in time range)  cephALEXin (KEFLEX) capsule 1,000 mg (has no administration in time range)      The patient appears reasonably screen and/or stabilized for discharge and I doubt any other medical condition or other [facility] requiring further screening, evaluation, or treatment in the ED at this time prior to discharge.    Final Clinical Impressions(s) / ED Diagnoses   Final diagnoses:  Cellulitis of abdominal wall    ED Discharge Orders        Ordered    sulfamethoxazole-trimethoprim (BACTRIM DS,SEPTRA DS) 800-160 MG tablet  2 times daily     01/19/18 0105    cephALEXin (KEFLEX) 500 MG capsule  4 times daily     01/19/18 0105           Melene Plan, DO 01/19/18 0111

## 2018-01-22 ENCOUNTER — Encounter (HOSPITAL_COMMUNITY): Payer: Self-pay | Admitting: Obstetrics and Gynecology

## 2018-01-22 ENCOUNTER — Other Ambulatory Visit: Payer: Self-pay

## 2018-01-22 ENCOUNTER — Inpatient Hospital Stay (HOSPITAL_COMMUNITY)
Admission: EM | Admit: 2018-01-22 | Discharge: 2018-01-25 | DRG: 728 | Disposition: A | Discharge status: Home / Self Care | Payer: Medicare Other | Attending: Internal Medicine | Admitting: Internal Medicine

## 2018-01-22 DIAGNOSIS — Z79899 Other long term (current) drug therapy: Secondary | ICD-10-CM

## 2018-01-22 DIAGNOSIS — I5032 Chronic diastolic (congestive) heart failure: Secondary | ICD-10-CM | POA: Diagnosis present

## 2018-01-22 DIAGNOSIS — I10 Essential (primary) hypertension: Secondary | ICD-10-CM | POA: Diagnosis present

## 2018-01-22 DIAGNOSIS — N5089 Other specified disorders of the male genital organs: Secondary | ICD-10-CM | POA: Diagnosis present

## 2018-01-22 DIAGNOSIS — G4733 Obstructive sleep apnea (adult) (pediatric): Secondary | ICD-10-CM | POA: Diagnosis present

## 2018-01-22 DIAGNOSIS — N433 Hydrocele, unspecified: Secondary | ICD-10-CM | POA: Diagnosis present

## 2018-01-22 DIAGNOSIS — Z6841 Body Mass Index (BMI) 40.0 and over, adult: Secondary | ICD-10-CM

## 2018-01-22 DIAGNOSIS — F17200 Nicotine dependence, unspecified, uncomplicated: Secondary | ICD-10-CM | POA: Diagnosis present

## 2018-01-22 DIAGNOSIS — I110 Hypertensive heart disease with heart failure: Secondary | ICD-10-CM | POA: Diagnosis present

## 2018-01-22 DIAGNOSIS — R600 Localized edema: Secondary | ICD-10-CM

## 2018-01-22 DIAGNOSIS — N492 Inflammatory disorders of scrotum: Secondary | ICD-10-CM | POA: Diagnosis not present

## 2018-01-22 DIAGNOSIS — L03311 Cellulitis of abdominal wall: Secondary | ICD-10-CM | POA: Diagnosis present

## 2018-01-22 DIAGNOSIS — J449 Chronic obstructive pulmonary disease, unspecified: Secondary | ICD-10-CM | POA: Diagnosis present

## 2018-01-22 DIAGNOSIS — R0602 Shortness of breath: Secondary | ICD-10-CM

## 2018-01-22 DIAGNOSIS — F17210 Nicotine dependence, cigarettes, uncomplicated: Secondary | ICD-10-CM | POA: Diagnosis present

## 2018-01-22 DIAGNOSIS — D509 Iron deficiency anemia, unspecified: Secondary | ICD-10-CM | POA: Diagnosis present

## 2018-01-22 DIAGNOSIS — E662 Morbid (severe) obesity with alveolar hypoventilation: Secondary | ICD-10-CM | POA: Diagnosis present

## 2018-01-22 DIAGNOSIS — L0390 Cellulitis, unspecified: Secondary | ICD-10-CM | POA: Diagnosis present

## 2018-01-22 DIAGNOSIS — Z888 Allergy status to other drugs, medicaments and biological substances status: Secondary | ICD-10-CM

## 2018-01-22 DIAGNOSIS — R0989 Other specified symptoms and signs involving the circulatory and respiratory systems: Secondary | ICD-10-CM | POA: Diagnosis present

## 2018-01-22 MED ORDER — VANCOMYCIN HCL 10 G IV SOLR
2500.0000 mg | Freq: Once | INTRAVENOUS | Status: AC
  Administered 2018-01-23: 2500 mg via INTRAVENOUS
  Filled 2018-01-22: qty 2000

## 2018-01-22 MED ORDER — PIPERACILLIN-TAZOBACTAM 3.375 G IVPB 30 MIN
3.3750 g | Freq: Once | INTRAVENOUS | Status: AC
  Administered 2018-01-22: 3.375 g via INTRAVENOUS
  Filled 2018-01-22: qty 50

## 2018-01-22 NOTE — ED Provider Notes (Signed)
[facility] Provider Note: Andre Dell, MD, FACEP  CSN: [id number] MRN: [id number] ARRIVAL: 01/22/18 at 2219 ROOM: WA20/WA20   CHIEF COMPLAINT  Groin Swelling   HISTORY OF PRESENT ILLNESS  01/22/18 10:57 PM Andre [name] is a 37 y.o. male with a long-standing history of recurrent groin and lower extremity cellulitis.  He was seen on July 8 for symptoms that is been present for about a week and placed on Keflex and Bactrim.  No imaging studies were done at the time.  He is here with worsening symptoms.  Specifically he has edema of the inferior aspect of his abdominal pannus extending into his scrotum.  He states his scrotum is normally not edematous but it has become severely edematous to the point that his penis is retracted within the edematous scrotum.  He has had a moderate amount of pain, especially when sitting or walking.  He denies a fever.  He denies difficulty urinating.  He has chronic edema of the lower legs, left greater than right.    Past Medical History:  Diagnosis Date  . Anemia    iron deficient  . Cellulitis    lower extremities  . Cellulitis of scrotum    History of  . CHF (congestive heart failure) (HCC)   . History of acute bronchitis with bronchospasm   . Hypertension   . Morbid obesity (HCC)   . Obesity hypoventilation syndrome (HCC)   . Obstructive sleep apnea   . Panniculitis    History of  . Sleep apnea     History reviewed. No pertinent surgical history.  No family history on file.  Social History   Tobacco Use  . Smoking status: Current Every Day Smoker    Packs/day: 1.00    Years: 9.00    Pack years: 9.00  . Smokeless tobacco: Never Used  Substance Use Topics  . Alcohol use: Yes  . Drug use: No    Prior to Admission medications   Medication Sig Start Date End Date Taking? Authorizing Provider  cephALEXin (KEFLEX) 500 MG capsule Take 1 capsule (500 mg total) by mouth 4 (four) times daily. 01/19/18  Yes Melene Plan, DO    sulfamethoxazole-trimethoprim (BACTRIM DS,SEPTRA DS) 800-160 MG tablet Take 1 tablet by mouth 2 (two) times daily for 7 days. 01/19/18 01/26/18 Yes Melene Plan, DO  miconazole nitrate (MICATIN) POWD Apply 1 application topically 2 (two) times daily. Use for 1 week. Patient not taking: Reported on 01/19/2018 01/16/15   Rodolph Bong, MD  oxyCODONE-acetaminophen (ROXICET) 5-325 MG per tablet Take 1-2 tablets by mouth every 4 (four) hours as needed for severe pain. Patient not taking: Reported on 01/19/2018 01/16/15   Rodolph Bong, MD    Allergies Other   REVIEW OF SYSTEMS  Negative except as noted here or in the History of Present Illness.   PHYSICAL EXAMINATION  Initial Vital Signs Blood pressure (!) 158/92, pulse 91, temperature 98.7 F (37.1 C), temperature source Oral, resp. rate 18, height 5\' 10"  (1.778 m), weight (!) 218.1 kg (480 lb 12.8 oz), SpO2 95 %.  Examination General: Well-developed, obese male in no acute distress; appearance consistent with age of record HENT: normocephalic; atraumatic Eyes: pupils equal, round and reactive to light; extraocular muscles intact Neck: supple Heart: regular rate and rhythm Lungs: Expiratory wheeze left upper lobe Abdomen: soft; obese; tenderness, edema and erythema of lower abdominal pannus; bowel sounds present GU: Severe edema of the scrotum with erythema and mild tenderness confluent with edema  and erythema of abdominal pannus noted above:    Extremities: No deformity; full range of motion; pulses normal Neurologic: Awake, alert and oriented; motor function intact in all extremities and symmetric; no facial droop Skin: Warm and dry Psychiatric: Normal mood and affect   RESULTS  Summary of this visit's results, reviewed by myself:   EKG Interpretation  Date/Time:    Ventricular Rate:    PR Interval:    QRS Duration:   QT Interval:    QTC Calculation:   R Axis:     Text Interpretation:        Laboratory  Studies: Results for orders placed or performed during the hospital encounter of 01/22/18 (from the past 24 hour(s))  CBC with Differential/Platelet     Status: None   Collection Time: 01/22/18 11:41 PM  Result Value Ref Range   WBC 10.0 4.0 - 10.5 K/uL   RBC 5.12 4.22 - 5.81 MIL/uL   Hemoglobin 15.1 13.0 - 17.0 g/dL   HCT 16.1 09.6 - 04.5 %   MCV 89.8 78.0 - 100.0 fL   MCH 29.5 26.0 - 34.0 pg   MCHC 32.8 30.0 - 36.0 g/dL   RDW 40.9 81.1 - 91.4 %   Platelets 190 150 - 400 K/uL   Neutrophils Relative % 69 %   Neutro Abs 6.8 1.7 - 7.7 K/uL   Lymphocytes Relative 20 %   Lymphs Abs 2.0 0.7 - 4.0 K/uL   Monocytes Relative 9 %   Monocytes Absolute 0.9 0.1 - 1.0 K/uL   Eosinophils Relative 2 %   Eosinophils Absolute 0.2 0.0 - 0.7 K/uL   Basophils Relative 0 %   Basophils Absolute 0.0 0.0 - 0.1 K/uL  Basic metabolic panel     Status: Abnormal   Collection Time: 01/22/18 11:41 PM  Result Value Ref Range   Sodium 141 135 - 145 mmol/L   Potassium 4.3 3.5 - 5.1 mmol/L   Chloride 103 98 - 111 mmol/L   CO2 29 22 - 32 mmol/L   Glucose, Bld 121 (H) 70 - 99 mg/dL   BUN 16 6 - 20 mg/dL   Creatinine, Ser 7.82 0.61 - 1.24 mg/dL   Calcium 8.6 (L) 8.9 - 10.3 mg/dL   GFR calc non Af Amer >60 >60 mL/min   GFR calc Af Amer >60 >60 mL/min   Anion gap 9 5 - 15  Urinalysis, Routine w reflex microscopic     Status: Abnormal   Collection Time: 01/23/18 12:09 AM  Result Value Ref Range   Color, Urine YELLOW YELLOW   APPearance CLEAR CLEAR   Specific Gravity, Urine 1.020 1.005 - 1.030   pH 5.0 5.0 - 8.0   Glucose, UA NEGATIVE NEGATIVE mg/dL   Hgb urine dipstick MODERATE (A) NEGATIVE   Bilirubin Urine NEGATIVE NEGATIVE   Ketones, ur NEGATIVE NEGATIVE mg/dL   Protein, ur 956 (A) NEGATIVE mg/dL   Nitrite NEGATIVE NEGATIVE   Leukocytes, UA NEGATIVE NEGATIVE   RBC / HPF 6-10 0 - 5 RBC/hpf   WBC, UA 0-5 0 - 5 WBC/hpf   Bacteria, UA RARE (A) NONE SEEN   Mucus PRESENT    Imaging Studies: No results  found.  ED COURSE and MDM  Nursing notes and initial vitals signs, including pulse oximetry, reviewed.  Vitals:   01/22/18 2231 01/22/18 2333 01/23/18 0012 01/23/18 0013  BP: (!) 158/92 (!) 158/82 (!) 180/88 (!) 180/88  Pulse: 91 82 94 91  Resp: 18 18  16   Temp:  98.7 F (37.1 C)     TempSrc: Oral     SpO2: 95% 95% 91% 92%  Weight: (!) 218.1 kg (480 lb 12.8 oz)     Height: 5\' 10"  (1.778 m)      Patient's examination is consistent with cellulitis.  I do not believe he has Fournier's gangrene as there is no odor, necrosis or crepitus.  We will start Zosyn and vancomycin for treatment of cellulitis and plan to have him admitted for failure of outpatient therapy.  1:25 AM After Toniann FailKakrakandy to admit to hospitalist service.  PROCEDURES    ED DIAGNOSES     ICD-10-CM   1. Cellulitis of abdominal wall L03.311   2. Cellulitis of scrotum N49.2   3. Edema of abdominal wall R60.0   4. Edema of scrotum N50.89        Paula LibraMolpus, Develle Sievers, MD 01/23/18 [phone]

## 2018-01-22 NOTE — Progress Notes (Signed)
A consult was received from an ED physician for vancomycin per pharmacy dosing.  The patient's profile has been reviewed for ht/wt/allergies/indication/available labs.   A one time order has been placed for Vancomycin 2500 mg.  Further antibiotics/pharmacy consults should be ordered by admitting physician if indicated.                       Thank you, Andre [name], Andre [name] 01/22/2018  11:31 PM

## 2018-01-22 NOTE — ED Triage Notes (Signed)
Pt complaint of testicle swelling with possible cellulitis. Pt reports he was here Monday with swelling but it has worsened since then. Visible redness and swelling to testicles bilaterally.  Pt reports walking is painful.

## 2018-01-23 ENCOUNTER — Inpatient Hospital Stay (HOSPITAL_COMMUNITY): Payer: Medicare Other

## 2018-01-23 ENCOUNTER — Other Ambulatory Visit: Payer: Self-pay

## 2018-01-23 ENCOUNTER — Encounter (HOSPITAL_COMMUNITY): Payer: Self-pay | Admitting: Internal Medicine

## 2018-01-23 ENCOUNTER — Other Ambulatory Visit (HOSPITAL_COMMUNITY): Payer: Medicare Other

## 2018-01-23 DIAGNOSIS — I5032 Chronic diastolic (congestive) heart failure: Secondary | ICD-10-CM | POA: Diagnosis present

## 2018-01-23 DIAGNOSIS — J449 Chronic obstructive pulmonary disease, unspecified: Secondary | ICD-10-CM | POA: Diagnosis present

## 2018-01-23 DIAGNOSIS — N492 Inflammatory disorders of scrotum: Secondary | ICD-10-CM | POA: Diagnosis present

## 2018-01-23 DIAGNOSIS — Z79899 Other long term (current) drug therapy: Secondary | ICD-10-CM | POA: Diagnosis not present

## 2018-01-23 DIAGNOSIS — G4733 Obstructive sleep apnea (adult) (pediatric): Secondary | ICD-10-CM | POA: Diagnosis not present

## 2018-01-23 DIAGNOSIS — L03311 Cellulitis of abdominal wall: Secondary | ICD-10-CM

## 2018-01-23 DIAGNOSIS — N5089 Other specified disorders of the male genital organs: Secondary | ICD-10-CM | POA: Diagnosis present

## 2018-01-23 DIAGNOSIS — I110 Hypertensive heart disease with heart failure: Secondary | ICD-10-CM | POA: Diagnosis present

## 2018-01-23 DIAGNOSIS — I10 Essential (primary) hypertension: Secondary | ICD-10-CM | POA: Diagnosis not present

## 2018-01-23 DIAGNOSIS — D509 Iron deficiency anemia, unspecified: Secondary | ICD-10-CM | POA: Diagnosis present

## 2018-01-23 DIAGNOSIS — F17200 Nicotine dependence, unspecified, uncomplicated: Secondary | ICD-10-CM | POA: Diagnosis not present

## 2018-01-23 DIAGNOSIS — N433 Hydrocele, unspecified: Secondary | ICD-10-CM | POA: Diagnosis present

## 2018-01-23 DIAGNOSIS — F17210 Nicotine dependence, cigarettes, uncomplicated: Secondary | ICD-10-CM | POA: Diagnosis present

## 2018-01-23 DIAGNOSIS — E662 Morbid (severe) obesity with alveolar hypoventilation: Secondary | ICD-10-CM | POA: Diagnosis present

## 2018-01-23 DIAGNOSIS — Z888 Allergy status to other drugs, medicaments and biological substances status: Secondary | ICD-10-CM | POA: Diagnosis not present

## 2018-01-23 DIAGNOSIS — R0989 Other specified symptoms and signs involving the circulatory and respiratory systems: Secondary | ICD-10-CM | POA: Diagnosis present

## 2018-01-23 DIAGNOSIS — Z6841 Body Mass Index (BMI) 40.0 and over, adult: Secondary | ICD-10-CM | POA: Diagnosis not present

## 2018-01-23 DIAGNOSIS — R0600 Dyspnea, unspecified: Secondary | ICD-10-CM | POA: Diagnosis not present

## 2018-01-23 LAB — URINALYSIS, ROUTINE W REFLEX MICROSCOPIC
Bilirubin Urine: NEGATIVE
GLUCOSE, UA: NEGATIVE mg/dL
Ketones, ur: NEGATIVE mg/dL
Leukocytes, UA: NEGATIVE
Nitrite: NEGATIVE
PH: 5 (ref 5.0–8.0)
Protein, ur: 100 mg/dL — AB
Specific Gravity, Urine: 1.02 (ref 1.005–1.030)

## 2018-01-23 LAB — BASIC METABOLIC PANEL
Anion gap: 9 (ref 5–15)
BUN: 16 mg/dL (ref 6–20)
CO2: 29 mmol/L (ref 22–32)
CREATININE: 0.92 mg/dL (ref 0.61–1.24)
Calcium: 8.6 mg/dL — ABNORMAL LOW (ref 8.9–10.3)
Chloride: 103 mmol/L (ref 98–111)
GFR calc Af Amer: >60 mL/min (ref >60)
Glucose, Bld: 121 mg/dL — ABNORMAL HIGH (ref 70–99)
POTASSIUM: 4.3 mmol/L (ref 3.5–5.1)
Sodium: 141 mmol/L (ref 135–145)

## 2018-01-23 LAB — CBC WITH DIFFERENTIAL/PLATELET
Basophils Absolute: 0 10*3/uL (ref 0.0–0.1)
Basophils Absolute: 0 10*3/uL (ref 0.0–0.1)
Basophils Relative: 0 %
Basophils Relative: 0 %
Eosinophils Absolute: 0.2 10*3/uL (ref 0.0–0.7)
Eosinophils Absolute: 0.2 10*3/uL (ref 0.0–0.7)
Eosinophils Relative: 2 %
Eosinophils Relative: 2 %
HCT: 46 % (ref 39.0–52.0)
HEMATOCRIT: 46.5 % (ref 39.0–52.0)
HEMOGLOBIN: 15 g/dL (ref 13.0–17.0)
Hemoglobin: 15.1 g/dL (ref 13.0–17.0)
LYMPHS ABS: 1.8 10*3/uL (ref 0.7–4.0)
LYMPHS ABS: 2 10*3/uL (ref 0.7–4.0)
LYMPHS PCT: 18 %
Lymphocytes Relative: 20 %
MCH: 29.1 pg (ref 26.0–34.0)
MCH: 29.5 pg (ref 26.0–34.0)
MCHC: 32.3 g/dL (ref 30.0–36.0)
MCHC: 32.8 g/dL (ref 30.0–36.0)
MCV: 89.8 fL (ref 78.0–100.0)
MCV: 90.1 fL (ref 78.0–100.0)
MONO ABS: 1.4 10*3/uL — AB (ref 0.1–1.0)
MONOS PCT: 14 %
Monocytes Absolute: 0.9 10*3/uL (ref 0.1–1.0)
Monocytes Relative: 9 %
NEUTROS PCT: 66 %
Neutro Abs: 6.4 10*3/uL (ref 1.7–7.7)
Neutro Abs: 6.8 10*3/uL (ref 1.7–7.7)
Neutrophils Relative %: 69 %
PLATELETS: 190 10*3/uL (ref 150–400)
Platelets: 189 10*3/uL (ref 150–400)
RBC: 5.12 MIL/uL (ref 4.22–5.81)
RBC: 5.16 MIL/uL (ref 4.22–5.81)
RDW: 13.7 % (ref 11.5–15.5)
RDW: 13.7 % (ref 11.5–15.5)
WBC: 10 10*3/uL (ref 4.0–10.5)
WBC: 9.7 10*3/uL (ref 4.0–10.5)

## 2018-01-23 LAB — COMPREHENSIVE METABOLIC PANEL
ALT: 18 U/L (ref 0–44)
AST: 18 U/L (ref 15–41)
Albumin: 3.1 g/dL — ABNORMAL LOW (ref 3.5–5.0)
Alkaline Phosphatase: 95 U/L (ref 38–126)
Anion gap: 8 (ref 5–15)
BUN: 12 mg/dL (ref 6–20)
CHLORIDE: 101 mmol/L (ref 98–111)
CO2: 28 mmol/L (ref 22–32)
Calcium: 8.6 mg/dL — ABNORMAL LOW (ref 8.9–10.3)
Creatinine, Ser: 0.62 mg/dL (ref 0.61–1.24)
Glucose, Bld: 103 mg/dL — ABNORMAL HIGH (ref 70–99)
POTASSIUM: 4.3 mmol/L (ref 3.5–5.1)
Sodium: 137 mmol/L (ref 135–145)
Total Bilirubin: 0.8 mg/dL (ref 0.3–1.2)
Total Protein: 7.1 g/dL (ref 6.5–8.1)

## 2018-01-23 LAB — HIV ANTIBODY (ROUTINE TESTING W REFLEX): HIV SCREEN 4TH GENERATION: NONREACTIVE

## 2018-01-23 MED ORDER — ONDANSETRON HCL 4 MG PO TABS: 4.0000 mg | ORAL_TABLET | Freq: Four times a day (QID) | ORAL | Status: DC | PRN

## 2018-01-23 MED ORDER — IOPAMIDOL (ISOVUE-300) INJECTION 61%
100.0000 mL | Freq: Once | INTRAVENOUS | Status: AC | PRN
  Administered 2018-01-23: 100 mL via INTRAVENOUS

## 2018-01-23 MED ORDER — HYDRALAZINE HCL 20 MG/ML IJ SOLN
10.0000 mg | INTRAMUSCULAR | Status: DC | PRN
  Administered 2018-01-23 (×2): 10 mg via INTRAVENOUS
  Filled 2018-01-23 (×2): qty 1

## 2018-01-23 MED ORDER — IOPAMIDOL (ISOVUE-300) INJECTION 61%
15.0000 mL | Freq: Once | INTRAVENOUS | Status: AC | PRN
  Administered 2018-01-23: 15 mL via ORAL
  Filled 2018-01-23: qty 30

## 2018-01-23 MED ORDER — ENOXAPARIN SODIUM 100 MG/ML ~~LOC~~ SOLN
100.0000 mg | SUBCUTANEOUS | Status: DC
  Administered 2018-01-23 – 2018-01-24 (×2): 100 mg via SUBCUTANEOUS
  Filled 2018-01-23 (×3): qty 1

## 2018-01-23 MED ORDER — IPRATROPIUM-ALBUTEROL 0.5-2.5 (3) MG/3ML IN SOLN
3.0000 mL | Freq: Three times a day (TID) | RESPIRATORY_TRACT | Status: DC
  Administered 2018-01-23 – 2018-01-25 (×5): 3 mL via RESPIRATORY_TRACT
  Filled 2018-01-23 (×6): qty 3

## 2018-01-23 MED ORDER — IPRATROPIUM-ALBUTEROL 0.5-2.5 (3) MG/3ML IN SOLN
3.0000 mL | RESPIRATORY_TRACT | Status: DC
  Administered 2018-01-23: 3 mL via RESPIRATORY_TRACT
  Filled 2018-01-23: qty 3

## 2018-01-23 MED ORDER — ONDANSETRON HCL 4 MG/2ML IJ SOLN
4.0000 mg | Freq: Four times a day (QID) | INTRAMUSCULAR | Status: DC | PRN
  Filled 2018-01-23: qty 2

## 2018-01-23 MED ORDER — FUROSEMIDE 10 MG/ML IJ SOLN
40.0000 mg | Freq: Once | INTRAMUSCULAR | Status: AC
  Administered 2018-01-23: 40 mg via INTRAVENOUS
  Filled 2018-01-23: qty 4

## 2018-01-23 MED ORDER — PIPERACILLIN-TAZOBACTAM 3.375 G IVPB
3.3750 g | Freq: Three times a day (TID) | INTRAVENOUS | Status: DC
  Administered 2018-01-23 – 2018-01-25 (×7): 3.375 g via INTRAVENOUS
  Filled 2018-01-23 (×9): qty 50

## 2018-01-23 MED ORDER — HYDRALAZINE HCL 25 MG PO TABS: 25.0000 mg | ORAL_TABLET | Freq: Three times a day (TID) | ORAL | Status: DC

## 2018-01-23 MED ORDER — IOPAMIDOL (ISOVUE-300) INJECTION 61%
INTRAVENOUS | Status: AC
  Filled 2018-01-23: qty 30

## 2018-01-23 MED ORDER — IOPAMIDOL (ISOVUE-300) INJECTION 61%
INTRAVENOUS | Status: AC
  Filled 2018-01-23: qty 100

## 2018-01-23 MED ORDER — LABETALOL HCL 5 MG/ML IV SOLN
10.0000 mg | Freq: Once | INTRAVENOUS | Status: AC
  Administered 2018-01-23: 10 mg via INTRAVENOUS
  Filled 2018-01-23: qty 4

## 2018-01-23 MED ORDER — VANCOMYCIN HCL IN DEXTROSE 1-5 GM/200ML-% IV SOLN: 1000.0000 mg | Freq: Once | INTRAVENOUS | Status: DC

## 2018-01-23 MED ORDER — IPRATROPIUM-ALBUTEROL 0.5-2.5 (3) MG/3ML IN SOLN
3.0000 mL | RESPIRATORY_TRACT | Status: DC | PRN
  Filled 2018-01-23: qty 3

## 2018-01-23 MED ORDER — ACETAMINOPHEN 650 MG RE SUPP: 650.0000 mg | Freq: Four times a day (QID) | RECTAL | Status: DC | PRN

## 2018-01-23 MED ORDER — HYDROCHLOROTHIAZIDE 12.5 MG PO CAPS: 12.5000 mg | ORAL_CAPSULE | Freq: Every day | ORAL | Status: DC

## 2018-01-23 MED ORDER — HYDROCHLOROTHIAZIDE 25 MG PO TABS
25.0000 mg | ORAL_TABLET | Freq: Every day | ORAL | Status: DC
  Administered 2018-01-23 – 2018-01-25 (×3): 25 mg via ORAL
  Filled 2018-01-23 (×3): qty 1

## 2018-01-23 MED ORDER — NICOTINE 21 MG/24HR TD PT24
21.0000 mg | MEDICATED_PATCH | Freq: Every day | TRANSDERMAL | Status: DC
  Administered 2018-01-23 – 2018-01-24 (×2): 21 mg via TRANSDERMAL
  Filled 2018-01-23 (×2): qty 1

## 2018-01-23 MED ORDER — AMLODIPINE BESYLATE 10 MG PO TABS
10.0000 mg | ORAL_TABLET | Freq: Every day | ORAL | Status: DC
  Administered 2018-01-23 – 2018-01-25 (×3): 10 mg via ORAL
  Filled 2018-01-23 (×3): qty 1

## 2018-01-23 MED ORDER — ACETAMINOPHEN 325 MG PO TABS: 650.0000 mg | ORAL_TABLET | Freq: Four times a day (QID) | ORAL | Status: DC | PRN

## 2018-01-23 MED ORDER — VANCOMYCIN HCL 10 G IV SOLR
1750.0000 mg | Freq: Two times a day (BID) | INTRAVENOUS | Status: DC
  Administered 2018-01-23 – 2018-01-25 (×4): 1750 mg via INTRAVENOUS
  Filled 2018-01-23 (×5): qty 1750

## 2018-01-23 NOTE — H&P (Signed)
History and Physical    JABES PRIMO VWU:[id number] DOB: 1981-01-01 DOA: 01/22/2018  PCP: Marletta Lor, NP  Patient coming from: Home.  Chief Complaint: Scrotal pain and lower abdominal pain.  HPI: Andre [name] is a 37 y.o. male with history of morbid obesity sleep apnea hypertension tobacco abuse presents to the ER with complaints of increasing swelling pain around the scrotum and the lower abdomen.  Patient states in the first week of May 2 months ago patient had left lower extremity cellulitis and was treated with antibiotics for 1 week and it resolved.  Patient started developing cellulitis of the left lower extremity again at the end of June 2 weeks ago and was given antibiotics despite taking which patient's cellulitis got worse and involve the scrotum and the lower abdomen and patient present to the ER.  Denies any fever chills.  Denies any dysuria and difficulty urinating.  ED Course: In the ER patient's scrotum appears enlarged with the discoloration but no definite signs of any gangrene or any tenderness or crackles on exam.  Patient also has discoloration of the lower abdomen involving the pannus.  Patient was started on empiric antibiotics and admitted for cellulitis.  On exam patient also has bilateral expiratory wheeze.  Review of Systems: As per HPI, rest all negative.   Past Medical History:  Diagnosis Date  . Anemia    iron deficient  . Cellulitis    lower extremities  . Cellulitis of scrotum    History of  . CHF (congestive heart failure) (HCC)   . History of acute bronchitis with bronchospasm   . Hypertension   . Morbid obesity (HCC)   . Obesity hypoventilation syndrome (HCC)   . Obstructive sleep apnea   . Panniculitis    History of  . Sleep apnea     History reviewed. No pertinent surgical history.   reports that he has been smoking.  He has a 9.00 pack-year smoking history. He has never used smokeless tobacco. He reports that he drinks  alcohol. He reports that he does not use drugs.  Allergies  Allergen Reactions  . Other Hives    Steroid, but cannot remember name.    History reviewed. No pertinent family history.  Prior to Admission medications   Medication Sig Start Date End Date Taking? Authorizing Provider  cephALEXin (KEFLEX) 500 MG capsule Take 1 capsule (500 mg total) by mouth 4 (four) times daily. 01/19/18  Yes Melene Plan, DO  sulfamethoxazole-trimethoprim (BACTRIM DS,SEPTRA DS) 800-160 MG tablet Take 1 tablet by mouth 2 (two) times daily for 7 days. 01/19/18 01/26/18 Yes Melene Plan, DO  miconazole nitrate (MICATIN) POWD Apply 1 application topically 2 (two) times daily. Use for 1 week. Patient not taking: Reported on 01/19/2018 01/16/15   Rodolph Bong, MD  oxyCODONE-acetaminophen (ROXICET) 5-325 MG per tablet Take 1-2 tablets by mouth every 4 (four) hours as needed for severe pain. Patient not taking: Reported on 01/19/2018 01/16/15   Rodolph Bong, MD    Physical Exam: Vitals:   01/23/18 0013 01/23/18 0130 01/23/18 0147 01/23/18 0233  BP: (!) 180/88 (!) 157/113 (!) 190/107 (!) 188/105  Pulse: 91 79 84 87  Resp: 16 15  14   Temp:    98 F (36.7 C)  TempSrc:    Oral  SpO2: 92% 93% 92% 92%  Weight:    (!) 215.5 kg (475 lb)  Height:    5\' 10"  (1.778 m)      Constitutional: Moderately built  and nourished. Vitals:   01/23/18 0013 01/23/18 0130 01/23/18 0147 01/23/18 0233  BP: (!) 180/88 (!) 157/113 (!) 190/107 (!) 188/105  Pulse: 91 79 84 87  Resp: 16 15  14   Temp:    98 F (36.7 C)  TempSrc:    Oral  SpO2: 92% 93% 92% 92%  Weight:    (!) 215.5 kg (475 lb)  Height:    5\' 10"  (1.778 m)   Eyes: Anicteric no pallor. ENMT: No discharge from the ears eyes nose or mouth. Neck: No mass or.  No neck rigidity no JVD appreciated. Respiratory: Bilateral expiratory wheeze and no crepitations. Cardiovascular: S1-S2 heard no murmurs appreciated. Abdomen: Soft nontender bowel sounds  present. Musculoskeletal: No edema. Skin: Scrotal area looks discolored with no active discharge.  It appears swollen.  Lower abdominals appears discolored. Neurologic: Alert awake oriented to time place and person.  Moves all extremities. Psychiatric: Appears normal per normal affect.   Labs on Admission: I have personally reviewed following labs and imaging studies  CBC: Recent Labs  Lab 01/22/18 2341  WBC 10.0  NEUTROABS 6.8  HGB 15.1  HCT 46.0  MCV 89.8  PLT 190   Basic Metabolic Panel: Recent Labs  Lab 01/22/18 2341  NA 141  K 4.3  CL 103  CO2 29  GLUCOSE 121*  BUN 16  CREATININE 0.92  CALCIUM 8.6*   GFR: Estimated Creatinine Clearance: 202.1 mL/min (by C-G formula based on SCr of 0.92 mg/dL). Liver Function Tests: No results for input(s): AST, ALT, ALKPHOS, BILITOT, PROT, ALBUMIN in the last 168 hours. No results for input(s): LIPASE, AMYLASE in the last 168 hours. No results for input(s): AMMONIA in the last 168 hours. Coagulation Profile: No results for input(s): INR, PROTIME in the last 168 hours. Cardiac Enzymes: No results for input(s): CKTOTAL, CKMB, CKMBINDEX, TROPONINI in the last 168 hours. BNP (last 3 results) No results for input(s): PROBNP in the last 8760 hours. HbA1C: No results for input(s): HGBA1C in the last 72 hours. CBG: No results for input(s): GLUCAP in the last 168 hours. Lipid Profile: No results for input(s): CHOL, HDL, LDLCALC, TRIG, CHOLHDL, LDLDIRECT in the last 72 hours. Thyroid Function Tests: No results for input(s): TSH, T4TOTAL, FREET4, T3FREE, THYROIDAB in the last 72 hours. Anemia Panel: No results for input(s): VITAMINB12, FOLATE, FERRITIN, TIBC, IRON, RETICCTPCT in the last 72 hours. Urine analysis:    Component Value Date/Time   COLORURINE YELLOW 01/23/2018 0009   APPEARANCEUR CLEAR 01/23/2018 0009   LABSPEC 1.020 01/23/2018 0009   PHURINE 5.0 01/23/2018 0009   GLUCOSEU NEGATIVE 01/23/2018 0009   HGBUR MODERATE  (A) 01/23/2018 0009   BILIRUBINUR NEGATIVE 01/23/2018 0009   KETONESUR NEGATIVE 01/23/2018 0009   PROTEINUR 100 (A) 01/23/2018 0009   UROBILINOGEN 2.0 (H) 06/18/2011 1055   NITRITE NEGATIVE 01/23/2018 0009   LEUKOCYTESUR NEGATIVE 01/23/2018 0009   Sepsis Labs: @LABRCNTIP (procalcitonin:4,lacticidven:4) )No results found for this or any previous visit (from the past 240 hour(s)).   Radiological Exams on Admission: No results found.  Assessment/Plan Principal Problem:   Cellulitis of scrotum Active Problems:   Morbid obesity (HCC)   TOBACCO ABUSE   Obstructive sleep apnea   Essential hypertension   Cellulitis   Cellulitis of abdominal wall    1. Cellulitis involving the scrotum and lower abdomen/pannus -no definite signs of any Fournier's gangrene..  CT abdomen pelvis.  Patient is placed on vancomycin and Zosyn.  Follow cultures. 2. Possible COPD with bronchitis -on exam patient has  bilateral expiratory wheeze noted.  I have placed patient on nebulizer treatment.  Patient is not in respiratory distress.  Will check chest x-ray. 3. Sleep apnea on CPAP. 4. Hypertension presently not on any medication I have placed patient on PRN IV hydralazine closely follow blood pressure trends. 5. Morbid obesity.   DVT prophylaxis: SCDs until we get results from CAT scan. Code Status: Full code. Family Communication: Discussed with patient. Disposition Plan: Home. Consults called: None. Admission status: Inpatient.   Eduard Clos MD [facility] Pager [phone].  If 7PM-7AM, please contact night-coverage [email or web address] Password [facility]  01/23/2018, 4:55 AM

## 2018-01-23 NOTE — Progress Notes (Signed)
Patient 's BP= 174/99  Paged MD Sharolyn DouglasEzenduka at 1725 Hydralazine 10mg  IV was given as ordered.

## 2018-01-23 NOTE — ED Notes (Signed)
ED TO INPATIENT HANDOFF REPORT  Name/Age/Gender Andre [name] 37 y.o. male  Code Status Code Status History    Date Active Date Inactive Code Status Order ID Comments User Context   01/13/2015 2322 01/16/2015 1627 Full Code [id number]  Theressa Millard, MD Inpatient      Home/SNF/Other Home  Chief Complaint Groin Swelling  Level of Care/Admitting Diagnosis ED Disposition    ED Disposition Condition [place] [facility]: [facility] [[id number]]  Level of Care: Med-Surg [16]  Diagnosis: Cellulitis [226333]  Admitting Physician: Rise Patience [[id number]]  Attending Physician: Rise Patience [phone]  Estimated length of stay: past midnight tomorrow  Certification:: I certify this patient will need inpatient services for at least 2 midnights  PT Class (Do Not Modify): Inpatient [101]  PT Acc Code (Do Not Modify): Private [1]       Medical History Past Medical History:  Diagnosis Date  . Anemia    iron deficient  . Cellulitis    lower extremities  . Cellulitis of scrotum    History of  . CHF (congestive heart failure) ([place])   . History of acute bronchitis with bronchospasm   . Hypertension   . Morbid obesity ([place])   . Obesity hypoventilation syndrome ([place])   . Obstructive sleep apnea   . Panniculitis    History of  . Sleep apnea     Allergies Allergies  Allergen Reactions  . Other Hives    Steroid, but cannot remember name.    IV Location/Drains/Wounds Patient Lines/Drains/Airways Status   Active Line/Drains/Airways    Name:   Placement date:   Placement time:   Site:   Days:   Peripheral IV 01/22/18 Left Antecubital   01/22/18    2330    Antecubital   1          Labs/Imaging Results for orders placed or performed during the hospital encounter of 01/22/18 (from the past 48 hour(s))  CBC with Differential/Platelet     Status: None   Collection Time: 01/22/18 11:41 PM  Result Value Ref Range   WBC 10.0 4.0 -  10.5 K/uL   RBC 5.12 4.22 - 5.81 MIL/uL   Hemoglobin 15.1 13.0 - 17.0 g/dL   HCT 46.0 39.0 - 52.0 %   MCV 89.8 78.0 - 100.0 fL   MCH 29.5 26.0 - 34.0 pg   MCHC 32.8 30.0 - 36.0 g/dL   RDW 13.7 11.5 - 15.5 %   Platelets 190 150 - 400 K/uL   Neutrophils Relative % 69 %   Neutro Abs 6.8 1.7 - 7.7 K/uL   Lymphocytes Relative 20 %   Lymphs Abs 2.0 0.7 - 4.0 K/uL   Monocytes Relative 9 %   Monocytes Absolute 0.9 0.1 - 1.0 K/uL   Eosinophils Relative 2 %   Eosinophils Absolute 0.2 0.0 - 0.7 K/uL   Basophils Relative 0 %   Basophils Absolute 0.0 0.0 - 0.1 K/uL    Comment: Performed at [facility], [place] [address], [place], [place] [address]  Basic metabolic panel     Status: Abnormal   Collection Time: 01/22/18 11:41 PM  Result Value Ref Range   Sodium 141 135 - 145 mmol/L   Potassium 4.3 3.5 - 5.1 mmol/L   Chloride 103 98 - 111 mmol/L    Comment: Please note change in reference range.   CO2 29 22 - 32 mmol/L   Glucose, Bld 121 (H) 70 -  99 mg/dL    Comment: Please note change in reference range.   BUN 16 6 - 20 mg/dL    Comment: Please note change in reference range.   Creatinine, Ser 0.92 0.61 - 1.24 mg/dL   Calcium 8.6 (L) 8.9 - 10.3 mg/dL   GFR calc non Af Amer >60 >60 mL/min   GFR calc Af Amer >60 >60 mL/min    Comment: (NOTE) The eGFR has been calculated using the CKD EPI equation. This calculation has not been validated in all clinical situations. eGFR's persistently <60 mL/min signify possible Chronic Kidney Disease.    Anion gap 9 5 - 15    Comment: Performed at [facility], [place] [address], [place], [place] [address]  Urinalysis, Routine w reflex microscopic     Status: Abnormal   Collection Time: 01/23/18 12:09 AM  Result Value Ref Range   Color, Urine YELLOW YELLOW   APPearance CLEAR CLEAR   Specific Gravity, Urine 1.020 1.005 - 1.030   pH 5.0 5.0 - 8.0   Glucose, UA NEGATIVE NEGATIVE mg/dL   Hgb urine dipstick MODERATE (A)  NEGATIVE   Bilirubin Urine NEGATIVE NEGATIVE   Ketones, ur NEGATIVE NEGATIVE mg/dL   Protein, ur 100 (A) NEGATIVE mg/dL   Nitrite NEGATIVE NEGATIVE   Leukocytes, UA NEGATIVE NEGATIVE   RBC / HPF 6-10 0 - 5 RBC/hpf   WBC, UA 0-5 0 - 5 WBC/hpf   Bacteria, UA RARE (A) NONE SEEN   Mucus PRESENT     Comment: Performed at [facility], [place] [address] [place], [place] [address]   No results found.  Pending Labs Unresulted Labs (From admission, onward)   None      Vitals/Pain Today's Vitals   01/22/18 2231 01/22/18 2333 01/23/18 0012 01/23/18 0013  BP: (!) 158/92 (!) 158/82 (!) 180/88 (!) 180/88  Pulse: 91 82 94 91  Resp: _0 Temp: 98.7 F (37.1 C)     TempSrc: Oral     SpO2: 95% 95% 91% 92%  Weight: (!) 480 lb 12.8 oz (218.1 kg)     Height: _1  (1.778 m)       Isolation Precautions No active isolations  Medications Medications  vancomycin (VANCOCIN) 2,500 mg in sodium chloride 0.9 % 500 mL IVPB (2,500 mg Intravenous New Bag/Given 01/23/18 0016)  piperacillin-tazobactam (ZOSYN) IVPB 3.375 g (0 g Intravenous Stopped 01/23/18 0016)    Mobility walks

## 2018-01-23 NOTE — Progress Notes (Signed)
PROGRESS NOTE  RUSHTON EARLY ZOX:[id number] DOB: December 28, 1980 DOA: 01/22/2018 PCP: Marletta Lor, NP  HPI/Recap of past 41 hours: 37 year old male with history of sleep apnea, hypertension, tobacco abuse, morbid obesity presents to the ER with complaints of increased swelling and pain around the scrotum and lower abdomen.  Patient reported having a left lower extremity cellulitis about 2 weeks ago, was prescribed antibiotics of which patient completed, but still noted his cellulitis was progressively getting worse, currently involving the scrotal in lower abdomen.  Patient denied any fever/chills, dysuria, problems urinating.  In the ED, patient noted to have an enlarged scrotum, with no signs of gangrene.  Patient also noted to have bilateral expiratory wheezing.  Patient was started on empiric antibiotics and admitted for further management.  Today, patient reported feeling about the same.  Still with bilateral expiratory wheeze.  Denies any cough, chest pain, abdominal pain, fever/chills, diarrhea.  Assessment/Plan: Principal Problem:   Cellulitis of scrotum Active Problems:   Morbid obesity (HCC)   TOBACCO ABUSE   Obstructive sleep apnea   Essential hypertension   Cellulitis   Cellulitis of abdominal wall  Cellulitis of the scrotum/lower abdomen/pannus Afebrile, no leukocytosis BC x2 pending Lactic acid within normal limits CT abdomen pelvis showed prominent edema in the panniculus consistent with cellulitis. No extension to the perineum at this time but the patient is at risk for developing Fournier's gangrene. Prominent edema and hydroceles in the scrotum. Possible large right epididymal cyst. Elevate scrotum Continue IV vancomycin and Zosyn Monitor closely  ??Chronic bronchitis/underlying COPD Patient reports history of chronic wheezing, tobacco abuse, ??underlying COPD Chest x-ray with limited study due to patient's body habitus, ??  Pulmonary vascular congestion BNP  19.7 Continue nebulizer treatment PCP to follow-up on PFTs as an outpatient  Hypertension Uncontrolled, ?? Worsened by pain Not on any home medication Started on amlodipine 10 mg, IV hydralazine PRN  OSA Continue CPAP   Morbid obesity Encouraged lifestyle modification  Tobacco abuse Advised to quit Nicotine patch     Code Status: Full  Family Communication: None at bedside   Disposition Plan: Home once significant improvement    Consultants:  None  Procedures:  None  Antimicrobials:  IV vancomycin  Zosyn  DVT prophylaxis: Lovenox   Objective: Vitals:   01/23/18 0513 01/23/18 0615 01/23/18 1025 01/23/18 1348  BP: (!) 183/105 (!) 179/110  (!) 164/83  Pulse: 88 87  66  Resp: 16   14  Temp: (!) 97.4 F (36.3 C)   (!) 97.5 F (36.4 C)  TempSrc:    Oral  SpO2: 93%  94% 97%  Weight:      Height:        Intake/Output Summary (Last 24 hours) at 01/23/2018 1403 Last data filed at 01/23/2018 1100 Gross per 24 hour  Intake 650 ml  Output -  Net 650 ml   Filed Weights   01/22/18 2229 01/22/18 2231 01/23/18 0233  Weight: (!) 186 kg (410 lb) (!) 218.1 kg (480 lb 12.8 oz) (!) 215.5 kg (475 lb)    Exam:   General: NAD  Cardiovascular: S1, S2 present  Respiratory: Bilateral expiratory wheezing noted, diminished breath sounds bilaterally  Abdomen: Soft, obese, nontender, bowel sounds present  Musculoskeletal: No pedal edema bilaterally  Skin: Scrotum appears swollen with some erythema, lower abdomen has a dimpled texture and some erythema as well.  No active drainage/discharge noted  Psychiatry: Normal mood   Data Reviewed: CBC: Recent Labs  Lab 01/22/18 2341 01/23/18 0602  WBC 10.0 9.7  NEUTROABS 6.8 6.4  HGB 15.1 15.0  HCT 46.0 46.5  MCV 89.8 90.1  PLT 190 189   Basic Metabolic Panel: Recent Labs  Lab 01/22/18 2341 01/23/18 0602  NA 141 137  K 4.3 4.3  CL 103 101  CO2 29 28  GLUCOSE 121* 103*  BUN 16 12  CREATININE 0.92  0.62  CALCIUM 8.6* 8.6*   GFR: Estimated Creatinine Clearance: 232.5 mL/min (by C-G formula based on SCr of 0.62 mg/dL). Liver Function Tests: Recent Labs  Lab 01/23/18 0602  AST 18  ALT 18  ALKPHOS 95  BILITOT 0.8  PROT 7.1  ALBUMIN 3.1*   No results for input(s): LIPASE, AMYLASE in the last 168 hours. No results for input(s): AMMONIA in the last 168 hours. Coagulation Profile: No results for input(s): INR, PROTIME in the last 168 hours. Cardiac Enzymes: No results for input(s): CKTOTAL, CKMB, CKMBINDEX, TROPONINI in the last 168 hours. BNP (last 3 results) No results for input(s): PROBNP in the last 8760 hours. HbA1C: No results for input(s): HGBA1C in the last 72 hours. CBG: No results for input(s): GLUCAP in the last 168 hours. Lipid Profile: No results for input(s): CHOL, HDL, LDLCALC, TRIG, CHOLHDL, LDLDIRECT in the last 72 hours. Thyroid Function Tests: No results for input(s): TSH, T4TOTAL, FREET4, T3FREE, THYROIDAB in the last 72 hours. Anemia Panel: No results for input(s): VITAMINB12, FOLATE, FERRITIN, TIBC, IRON, RETICCTPCT in the last 72 hours. Urine analysis:    Component Value Date/Time   COLORURINE YELLOW 01/23/2018 0009   APPEARANCEUR CLEAR 01/23/2018 0009   LABSPEC 1.020 01/23/2018 0009   PHURINE 5.0 01/23/2018 0009   GLUCOSEU NEGATIVE 01/23/2018 0009   HGBUR MODERATE (A) 01/23/2018 0009   BILIRUBINUR NEGATIVE 01/23/2018 0009   KETONESUR NEGATIVE 01/23/2018 0009   PROTEINUR 100 (A) 01/23/2018 0009   UROBILINOGEN 2.0 (H) 06/18/2011 1055   NITRITE NEGATIVE 01/23/2018 0009   LEUKOCYTESUR NEGATIVE 01/23/2018 0009   Sepsis Labs: @LABRCNTIP (procalcitonin:4,lacticidven:4)  )No results found for this or any previous visit (from the past 240 hour(s)).    Studies: Ct Abdomen Pelvis W Contrast  Result Date: 01/23/2018 CLINICAL DATA:  Pain and swelling of the scrotum and lower abdomen. Recurrent cellulitis. EXAM: CT ABDOMEN AND PELVIS WITH CONTRAST  TECHNIQUE: Multidetector CT imaging of the abdomen and pelvis was performed using the standard protocol following bolus administration of intravenous contrast. CONTRAST:  ISOVUE-300 IOPAMIDOL (ISOVUE-300) INJECTION 61% COMPARISON:  None. FINDINGS: Lower chest: Normal. Hepatobiliary: No focal liver abnormality is seen. No gallstones, gallbladder wall thickening, or biliary dilatation. Pancreas: Unremarkable. No pancreatic ductal dilatation or surrounding inflammatory changes. Spleen: Normal in size without focal abnormality. Adrenals/Urinary Tract: Adrenal glands are unremarkable. Kidneys are normal, without renal calculi, focal lesion, or hydronephrosis. Bladder is unremarkable. Stomach/Bowel: Stomach is within normal limits. Appendix appears normal. No evidence of bowel wall thickening, distention, or inflammatory changes. Vascular/Lymphatic: No significant vascular findings are present. There is slightly enlarged lymph nodes in the inguinal regions bilaterally. The largest is 22 mm in diameter on image 104 of series 3 in the left inguinal region. These are most likely reactive secondary to the cellulitis. Reproductive: Prostate is unremarkable. Other: There is prominent subcutaneous edema in the panniculus. This does not extend into the perineum at this time. There is prominent edema and what appears to be prominent hydroceles in the scrotum. The testicles appear normal. There is a 5 cm cyst adjacent to the right testicle which may represent a large epididymal cyst or  a loculated hydrocele. Musculoskeletal: No acute abnormality. Severe spinal stenosis at L2-3, L3-4 and L4-5. 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 IMPRESSION: 1. Prominent edema in the panniculus consistent with cellulitis. No extension to the perineum at this time but the patient is at risk for developing Fournier's gangrene. 2. Prominent edema and hydroceles in the scrotum. Possible large right epididymal cyst. 3. Severe multilevel  spinal stenosis. Electronically Signed   By: Francene BoyersJames  Maxwell M.D.   On: 01/23/2018 11:45   Dg Chest Port 1 View  Result Date: 01/23/2018 CLINICAL DATA:  Shortness of breath.  Cough. EXAM: PORTABLE CHEST 1 VIEW COMPARISON:  June 24, 2011 FINDINGS: Limited study due to patient body habitus. Haziness over the bases limits evaluation, likely due to overlapping soft tissues. Stable cardiomegaly. No pneumothorax. No nodules or masses. No overt edema. Suspect pulmonary venous congestion. IMPRESSION: Limited study. Suspect pulmonary venous congestion without overt edema. Electronically Signed   By: Gerome Samavid  Williams III M.D   On: 01/23/2018 09:07    Scheduled Meds: . amLODipine  10 mg Oral Daily  . enoxaparin (LOVENOX) injection  100 mg Subcutaneous Q24H  . iopamidol      . iopamidol      . ipratropium-albuterol  3 mL Nebulization TID    Continuous Infusions: . piperacillin-tazobactam 3.375 g (01/23/18 1356)  . vancomycin Stopped (01/23/18 1350)     LOS: 0 days     Briant CedarNkeiruka J Maleta Pacha, MD [facility]   If 7PM-7AM, please contact night-coverage [email or web address] Password [email or web address] 01/23/2018, 2:03 PM

## 2018-01-23 NOTE — Progress Notes (Signed)
Pharmacy Antibiotic Note  Janae BridgemanDouglas L Blubaugh is a 37 y.o. male admitted on 01/22/2018 with cellulitis.  Pharmacy has been consulted for zosyn and vancomycin dosing.  Plan: Zosyn 3.375g IV q8h (4 hour infusion).  Vancomycin 2500 mg x1 then  1750 mg IV q12h for AUC = 426 Goal AUC = 400-500 Daily Scr F/u cultures/levels  Height: 5\' 10"  (177.8 cm) Weight: (!) 475 lb (215.5 kg) IBW/kg (Calculated) : 73  Temp (24hrs), Avg:98.4 F (36.9 C), Min:98 F (36.7 C), Max:98.7 F (37.1 C)  Recent Labs  Lab 01/22/18 2341  WBC 10.0  CREATININE 0.92    Estimated Creatinine Clearance: 202.1 mL/min (by C-G formula based on SCr of 0.92 mg/dL).    Allergies  Allergen Reactions  . Other Hives    Steroid, but cannot remember name.    Antimicrobials this admission: 7/11 zosyn >>  7/11 vancomycin >>   Dose adjustments this admission:   Microbiology results:  BCx:   UCx:    Sputum:    MRSA PCR:  Thank you for allowing pharmacy to be a part of this patient's care.  Lorenza EvangelistGreen, Neziah Braley R 01/23/2018 5:01 AM

## 2018-01-24 DIAGNOSIS — G4733 Obstructive sleep apnea (adult) (pediatric): Secondary | ICD-10-CM

## 2018-01-24 DIAGNOSIS — I10 Essential (primary) hypertension: Secondary | ICD-10-CM

## 2018-01-24 DIAGNOSIS — N492 Inflammatory disorders of scrotum: Principal | ICD-10-CM

## 2018-01-24 DIAGNOSIS — L03311 Cellulitis of abdominal wall: Secondary | ICD-10-CM

## 2018-01-24 DIAGNOSIS — F17200 Nicotine dependence, unspecified, uncomplicated: Secondary | ICD-10-CM

## 2018-01-24 LAB — CBC WITH DIFFERENTIAL/PLATELET
BASOS ABS: 0 10*3/uL (ref 0.0–0.1)
BASOS PCT: 0 %
EOS ABS: 0.2 10*3/uL (ref 0.0–0.7)
EOS PCT: 2 %
HCT: 50.6 % (ref 39.0–52.0)
Hemoglobin: 16.3 g/dL (ref 13.0–17.0)
Lymphocytes Relative: 16 %
Lymphs Abs: 1.5 10*3/uL (ref 0.7–4.0)
MCH: 29.2 pg (ref 26.0–34.0)
MCHC: 32.2 g/dL (ref 30.0–36.0)
MCV: 90.7 fL (ref 78.0–100.0)
MONO ABS: 1.1 10*3/uL — AB (ref 0.1–1.0)
MONOS PCT: 12 %
Neutro Abs: 6.5 10*3/uL (ref 1.7–7.7)
Neutrophils Relative %: 70 %
PLATELETS: 194 10*3/uL (ref 150–400)
RBC: 5.58 MIL/uL (ref 4.22–5.81)
RDW: 13.9 % (ref 11.5–15.5)
WBC: 9.4 10*3/uL (ref 4.0–10.5)

## 2018-01-24 LAB — BASIC METABOLIC PANEL
Anion gap: 10 (ref 5–15)
BUN: 10 mg/dL (ref 6–20)
CO2: 31 mmol/L (ref 22–32)
CREATININE: 0.75 mg/dL (ref 0.61–1.24)
Calcium: 9.3 mg/dL (ref 8.9–10.3)
Chloride: 96 mmol/L — ABNORMAL LOW (ref 98–111)
GFR calc non Af Amer: >60 mL/min (ref >60)
Glucose, Bld: 96 mg/dL (ref 70–99)
Potassium: 4.3 mmol/L (ref 3.5–5.1)
SODIUM: 137 mmol/L (ref 135–145)

## 2018-01-24 NOTE — Progress Notes (Addendum)
PROGRESS NOTE  Andre [name] UEA:[id number] DOB: [birth year] DOA: 01/22/2018 PCP: Marletta Lor, NP  HPI/Recap of past 1 hours: 37 year old male with history of sleep apnea, hypertension, tobacco abuse, morbid obesity presents to the ER with complaints of increased swelling and pain around the scrotum and lower abdomen.  Patient reported having a left lower extremity cellulitis about 2 weeks ago, was prescribed antibiotics of which patient completed, but still noted his cellulitis was progressively getting worse, currently involving the scrotal in lower abdomen.  Patient denied any fever/chills, dysuria, problems urinating.  In the ED, patient noted to have an enlarged scrotum, with no signs of gangrene.  Patient also noted to have bilateral expiratory wheezing.  Patient was started on empiric antibiotics and admitted for further management.  Today, patient reported feeling better, still sore around the scrotal region which is also swollen. Denies any cough, chest pain, abdominal pain, fever/chills, diarrhea.  Assessment/Plan: Principal Problem:   Cellulitis of scrotum Active Problems:   Morbid obesity (HCC)   TOBACCO ABUSE   Obstructive sleep apnea   Essential hypertension   Cellulitis   Cellulitis of abdominal wall  Cellulitis of the scrotum/lower abdomen/pannus Afebrile, no leukocytosis BC x2 NGTD Lactic acid within normal limits CT abdomen pelvis showed prominent edema in the panniculus consistent with cellulitis. No extension to the perineum at this time but the patient is at risk for developing Fournier's gangrene. Prominent edema and hydroceles in the scrotum. Possible large right epididymal cyst. Elevate scrotum Continue IV vancomycin and Zosyn Monitor closely  ??Chronic bronchitis/underlying COPD Patient reports history of chronic wheezing, tobacco abuse, ??underlying COPD Chest x-ray with limited study due to patient's body habitus, ??  Pulmonary vascular  congestion BNP 19.7 ECHO pending Continue nebulizer treatment PCP to follow-up on PFTs as an outpatient  Hypertension Uncontrolled, ?? Worsened by pain Not on any home medication Started on amlodipine 10 mg, HCT 25 mg daily, IV hydralazine PRN  OSA Continue CPAP   Morbid obesity Encouraged lifestyle modification  Tobacco abuse Advised to quit Nicotine patch     Code Status: Full  Family Communication: None at bedside   Disposition Plan: Home once significant improvement    Consultants:  None  Procedures:  None  Antimicrobials:  IV vancomycin  Zosyn  DVT prophylaxis: Lovenox   Objective: Vitals:   01/24/18 0828 01/24/18 0831 01/24/18 1402 01/24/18 1432  BP:   (!) 146/87   Pulse:   66   Resp:   18   Temp:   (!) 97.5 F (36.4 C)   TempSrc:      SpO2: 97% 97% 97% 94%  Weight:      Height:        Intake/Output Summary (Last 24 hours) at 01/24/2018 1733 Last data filed at 01/24/2018 1434 Gross per 24 hour  Intake 1298.75 ml  Output -  Net 1298.75 ml   Filed Weights   01/22/18 2229 01/22/18 2231 01/23/18 0233  Weight: (!) 186 kg (410 lb) (!) 218.1 kg (480 lb 12.8 oz) (!) 215.5 kg (475 lb)    Exam:   General: NAD  Cardiovascular: S1, S2 present  Respiratory: Diminished breath sounds bilaterally  Abdomen: Soft, obese, nontender, bowel sounds present  Musculoskeletal: No pedal edema bilaterally  Skin: Scrotum swollen, lower abdomen has a dimpled texture and some erythema as well.  No active drainage/discharge noted  Psychiatry: Normal mood   Data Reviewed: CBC: Recent Labs  Lab 01/22/18 2341 01/23/18 0602 01/24/18 0518  WBC 10.0 9.7 9.4  NEUTROABS 6.8 6.4 6.5  HGB 15.1 15.0 16.3  HCT 46.0 46.5 50.6  MCV 89.8 90.1 90.7  PLT 190 189 194   Basic Metabolic Panel: Recent Labs  Lab 01/22/18 2341 01/23/18 0602 01/24/18 0518  NA 141 137 137  K 4.3 4.3 4.3  CL 103 101 96*  CO2 29 28 31   GLUCOSE 121* 103* 96  BUN 16 12  10   CREATININE 0.92 0.62 0.75  CALCIUM 8.6* 8.6* 9.3   GFR: Estimated Creatinine Clearance: 232.5 mL/min (by C-G formula based on SCr of 0.75 mg/dL). Liver Function Tests: Recent Labs  Lab 01/23/18 0602  AST 18  ALT 18  ALKPHOS 95  BILITOT 0.8  PROT 7.1  ALBUMIN 3.1*   No results for input(s): LIPASE, AMYLASE in the last 168 hours. No results for input(s): AMMONIA in the last 168 hours. Coagulation Profile: No results for input(s): INR, PROTIME in the last 168 hours. Cardiac Enzymes: No results for input(s): CKTOTAL, CKMB, CKMBINDEX, TROPONINI in the last 168 hours. BNP (last 3 results) No results for input(s): PROBNP in the last 8760 hours. HbA1C: No results for input(s): HGBA1C in the last 72 hours. CBG: No results for input(s): GLUCAP in the last 168 hours. Lipid Profile: No results for input(s): CHOL, HDL, LDLCALC, TRIG, CHOLHDL, LDLDIRECT in the last 72 hours. Thyroid Function Tests: No results for input(s): TSH, T4TOTAL, FREET4, T3FREE, THYROIDAB in the last 72 hours. Anemia Panel: No results for input(s): VITAMINB12, FOLATE, FERRITIN, TIBC, IRON, RETICCTPCT in the last 72 hours. Urine analysis:    Component Value Date/Time   COLORURINE YELLOW 01/23/2018 0009   APPEARANCEUR CLEAR 01/23/2018 0009   LABSPEC 1.020 01/23/2018 0009   PHURINE 5.0 01/23/2018 0009   GLUCOSEU NEGATIVE 01/23/2018 0009   HGBUR MODERATE (A) 01/23/2018 0009   BILIRUBINUR NEGATIVE 01/23/2018 0009   KETONESUR NEGATIVE 01/23/2018 0009   PROTEINUR 100 (A) 01/23/2018 0009   UROBILINOGEN 2.0 (H) 06/18/2011 1055   NITRITE NEGATIVE 01/23/2018 0009   LEUKOCYTESUR NEGATIVE 01/23/2018 0009   Sepsis Labs: @LABRCNTIP (procalcitonin:4,lacticidven:4)  ) Recent Results (from the past 240 hour(s))  Culture, blood (routine x 2)     Status: None (Preliminary result)   Collection Time: 01/23/18  9:01 AM  Result Value Ref Range Status   Specimen Description   Final    BLOOD RIGHT ANTECUBITAL Performed  at [facility], [address], [place], [place] [address]    Special Requests   Final    BOTTLES DRAWN AEROBIC ONLY Blood Culture results may not be optimal due to an inadequate volume of blood received in culture bottles Performed at [facility], [address], [place], [place] [address]    Culture   Final    NO GROWTH 1 DAY Performed at [facility], [address], [place], [place] [address]    Report Status PENDING  Incomplete  Culture, blood (routine x 2)     Status: None (Preliminary result)   Collection Time: 01/23/18  9:09 AM  Result Value Ref Range Status   Specimen Description   Final    BLOOD RIGHT HAND Performed at [facility], [address], [place], [place] [address]    Special Requests   Final    BOTTLES DRAWN AEROBIC ONLY Blood Culture adequate volume Performed at [facility], [address], [place], [place] [address]    Culture   Final    NO GROWTH 1 DAY Performed at [facility], [address] [place], [place] [address]    Report Status PENDING  Incomplete      Studies: No results found.  Scheduled Meds: . amLODipine  10 mg Oral Daily  . enoxaparin (LOVENOX) injection  100 mg Subcutaneous Q24H  . hydrochlorothiazide  25 mg Oral Daily  . ipratropium-albuterol  3 mL Nebulization TID  . nicotine  21 mg Transdermal Daily    Continuous Infusions: . piperacillin-tazobactam 3.375 g (01/24/18 1437)  . vancomycin Stopped (01/24/18 1725)     LOS: 1 day     Briant CedarNkeiruka J Ezenduka, MD [facility]   If 7PM-7AM, please contact night-coverage [email or web address] Password [email or web address] 01/24/2018, 5:33 PM

## 2018-01-25 ENCOUNTER — Inpatient Hospital Stay (HOSPITAL_COMMUNITY): Payer: Medicare Other

## 2018-01-25 DIAGNOSIS — I5032 Chronic diastolic (congestive) heart failure: Secondary | ICD-10-CM

## 2018-01-25 DIAGNOSIS — R0600 Dyspnea, unspecified: Secondary | ICD-10-CM

## 2018-01-25 LAB — CBC WITH DIFFERENTIAL/PLATELET
Basophils Absolute: 0 10*3/uL (ref 0.0–0.1)
Basophils Relative: 0 %
Eosinophils Absolute: 0.2 10*3/uL (ref 0.0–0.7)
Eosinophils Relative: 2 %
HCT: 48 % (ref 39.0–52.0)
HEMOGLOBIN: 15.2 g/dL (ref 13.0–17.0)
LYMPHS PCT: 20 %
Lymphs Abs: 1.8 10*3/uL (ref 0.7–4.0)
MCH: 28.8 pg (ref 26.0–34.0)
MCHC: 31.7 g/dL (ref 30.0–36.0)
MCV: 90.9 fL (ref 78.0–100.0)
Monocytes Absolute: 1.4 10*3/uL — ABNORMAL HIGH (ref 0.1–1.0)
Monocytes Relative: 15 %
NEUTROS ABS: 5.8 10*3/uL (ref 1.7–7.7)
NEUTROS PCT: 63 %
Platelets: 190 10*3/uL (ref 150–400)
RBC: 5.28 MIL/uL (ref 4.22–5.81)
RDW: 13.9 % (ref 11.5–15.5)
WBC: 9.2 10*3/uL (ref 4.0–10.5)

## 2018-01-25 LAB — BASIC METABOLIC PANEL
Anion gap: 10 (ref 5–15)
BUN: 13 mg/dL (ref 6–20)
CALCIUM: 8.7 mg/dL — AB (ref 8.9–10.3)
CHLORIDE: 99 mmol/L (ref 98–111)
CO2: 29 mmol/L (ref 22–32)
CREATININE: 0.77 mg/dL (ref 0.61–1.24)
GFR calc Af Amer: >60 mL/min (ref >60)
Glucose, Bld: 108 mg/dL — ABNORMAL HIGH (ref 70–99)
Potassium: 4.3 mmol/L (ref 3.5–5.1)
SODIUM: 138 mmol/L (ref 135–145)

## 2018-01-25 LAB — ECHOCARDIOGRAM COMPLETE
Height: 70 in
Weight: 7600 [oz_av]

## 2018-01-25 MED ORDER — AMLODIPINE BESYLATE 10 MG PO TABS: 10.0000 mg | ORAL_TABLET | Freq: Every day | ORAL | 0 refills | Status: DC

## 2018-01-25 MED ORDER — ALBUTEROL SULFATE HFA 108 (90 BASE) MCG/ACT IN AERS: 2.0000 | INHALATION_SPRAY | Freq: Four times a day (QID) | RESPIRATORY_TRACT | 0 refills | Status: AC | PRN

## 2018-01-25 MED ORDER — DOXYCYCLINE HYCLATE 100 MG PO CAPS: 100.0000 mg | ORAL_CAPSULE | Freq: Two times a day (BID) | ORAL | 0 refills | Status: AC

## 2018-01-25 MED ORDER — NICOTINE 21 MG/24HR TD PT24: 21.0000 mg | MEDICATED_PATCH | Freq: Every day | TRANSDERMAL | 0 refills | Status: DC

## 2018-01-25 MED ORDER — FUROSEMIDE 40 MG PO TABS: 40.0000 mg | ORAL_TABLET | Freq: Every day | ORAL | 0 refills | Status: DC

## 2018-01-25 MED ORDER — MICONAZOLE NITRATE POWD: 1.0000 "application " | Freq: Two times a day (BID) | 0 refills | Status: DC

## 2018-01-25 MED ORDER — FUROSEMIDE 10 MG/ML IJ SOLN
40.0000 mg | Freq: Once | INTRAMUSCULAR | Status: AC
  Administered 2018-01-25: 40 mg via INTRAVENOUS
  Filled 2018-01-25: qty 4

## 2018-01-25 MED ORDER — LISINOPRIL 5 MG PO TABS: 5.0000 mg | ORAL_TABLET | Freq: Every day | ORAL | 0 refills | Status: DC

## 2018-01-25 NOTE — Discharge Summary (Signed)
Discharge Summary  Andre [name] WUJ:[id number] DOB: March 09, 1981  PCP: Marletta Lor, NP  Admit date: 01/22/2018 Discharge date: 01/25/2018  Time spent: 40 mins  Recommendations for Outpatient Follow-up:  1. PCP   Discharge Diagnoses:  Active Hospital Problems   Diagnosis Date Noted  . Cellulitis of scrotum 01/23/2018  . Cellulitis of abdominal wall 01/23/2018  . Cellulitis 01/13/2015  . TOBACCO ABUSE 03/23/2009  . Obstructive sleep apnea 11/12/2008  . Morbid obesity (HCC) 11/12/2008  . Essential hypertension 11/12/2008    Resolved Hospital Problems  No resolved problems to display.    Discharge Condition: Stable   Diet recommendation: Heart healthy  Vitals:   01/24/18 2137 01/25/18 0606  BP:  (!) 154/98  Pulse:  82  Resp:  18  Temp:  (!) 97.5 F (36.4 C)  SpO2: 96% 92%    History of present illness:  37 year old male with history of sleep apnea, hypertension, tobacco abuse, morbid obesity presents to the ER with complaints of increased swelling and pain around the scrotum and lower abdomen.  Patient reported having a left lower extremity cellulitis about 2 weeks ago, was prescribed antibiotics of which patient completed, but still noted his cellulitis was progressively getting worse, currently involving the scrotal in lower abdomen.  Patient denied any fever/chills, dysuria, problems urinating.  In the ED, patient noted to have an enlarged scrotum, with no signs of gangrene.  Patient also noted to have bilateral expiratory wheezing. Patient was started on empiric antibiotics and admitted for further management.  Today, patient reported feeling better, scrotum much less swollen and tender. Denies any cough, chest pain, abdominal pain, fever/chills, diarrhea.   Hospital Course:  Principal Problem:   Cellulitis of scrotum Active Problems:   Morbid obesity (HCC)   TOBACCO ABUSE   Obstructive sleep apnea   Essential hypertension   Cellulitis   Cellulitis of  abdominal wall  Cellulitis of the scrotum/lower abdomen/pannus Afebrile, no leukocytosis BC x2 NGTD Lactic acid within normal limits CT abdomen pelvis showed prominent edema in the panniculus consistent with cellulitis. No extension to the perineum at this time but the patient is at risk for developing Fournier's gangrene. Prominent edema and hydroceles in the scrotum. Possible large right epididymal cyst. Elevate scrotum Advised to keep area dry (miconazole powder) and clean S/P IV vancomycin and Zosyn, will switch to PO doxycycline for a total of 10 days  New diagnosis of chronic diastolic HF Likely due to uncontrolled HTN Scrotal edema BNP 19.7 CXR with ?pulmonary vascular congestion, although limited study due to body habitus ECHO with EF of 50-55%, Grade 2 DD, no RWMA S/P 1 dose of IV lasix 40 mg, will d/c on PO lasix 40 mg daily PCP to follow up with repeat labs  ??Chronic bronchitis/underlying COPD Patient reports history of chronic wheezing, tobacco abuse, ??underlying COPD, ?cardiac wheezing Chest x-ray with limited study due to patient's body habitus, ??  Pulmonary vascular congestion BNP 19.7 D/C on albuterol inhaler PCP to follow-up on PFTs as an outpatient  Hypertension Uncontrolled Not on any home medication Started on amlodipine 10 mg, lisinopril 5 mg, lasix 40 mg PCP to adjust meds  OSA Continue CPAP   Morbid obesity Encouraged lifestyle modification  Tobacco abuse Advised to quit Nicotine patch      Procedures:  None  Consultations:  None  Discharge Exam: BP (!) 154/98 (BP Location: Left Arm) Comment: nurse notified  Pulse 82   Temp (!) 97.5 F (36.4 C) (Oral)   Resp 18  Ht 5\' 10"  (1.778 m)   Wt (!) 215.5 kg (475 lb)   SpO2 92%   BMI 68.16 kg/m   General: NAD Cardiovascular: S1, S2 present Respiratory: Diminished BS bilaterally, mild wheezing noted   Discharge Instructions You were cared for by a hospitalist during your  hospital stay. If you have any questions about your discharge medications or the care you received while you were in the hospital after you are discharged, you can call the unit and asked to speak with the hospitalist on call if the hospitalist that took care of you is not available. Once you are discharged, your primary care physician will handle any further medical issues. Please note that NO REFILLS for any discharge medications will be authorized once you are discharged, as it is imperative that you return to your primary care physician (or establish a relationship with a primary care physician if you do not have one) for your aftercare needs so that they can reassess your need for medications and monitor your lab values.  Discharge Instructions    Diet - low sodium heart healthy   Complete by:  As directed    Increase activity slowly   Complete by:  As directed      Allergies as of 01/25/2018      Reactions   Other Hives   Steroid, but cannot remember name.      Medication List    STOP taking these medications   cephALEXin 500 MG capsule Commonly known as:  KEFLEX   oxyCODONE-acetaminophen 5-325 MG tablet Commonly known as:  ROXICET   sulfamethoxazole-trimethoprim 800-160 MG tablet Commonly known as:  BACTRIM DS,SEPTRA DS     TAKE these medications   albuterol 108 (90 Base) MCG/ACT inhaler Commonly known as:  PROVENTIL HFA;VENTOLIN HFA Inhale 2 puffs into the lungs every 6 (six) hours as needed for wheezing or shortness of breath.   amLODipine 10 MG tablet Commonly known as:  NORVASC Take 1 tablet (10 mg total) by mouth daily.   doxycycline 100 MG capsule Commonly known as:  VIBRAMYCIN Take 1 capsule (100 mg total) by mouth 2 (two) times daily for 7 days.   furosemide 40 MG tablet Commonly known as:  LASIX Take 1 tablet (40 mg total) by mouth daily.   lisinopril 5 MG tablet Commonly known as:  PRINIVIL,ZESTRIL Take 1 tablet (5 mg total) by mouth daily.     miconazole nitrate Powd Commonly known as:  MICATIN Apply 1 application topically 2 (two) times daily. Use for 1 week.   nicotine 21 mg/24hr patch Commonly known as:  NICODERM CQ - dosed in mg/24 hours Place 1 patch (21 mg total) onto the skin daily. Start taking on:  01/26/2018      Allergies  Allergen Reactions  . Other Hives    Steroid, but cannot remember name.   Follow-up Information    Marletta LorBarr, Julie, NP. Schedule an appointment as soon as possible for a visit in 1 week(s).   Specialty:  Nurse Practitioner Contact information: [facility] [address] [place] [address] [phone]            The results of significant diagnostics from this hospitalization (including imaging, microbiology, ancillary and laboratory) are listed below for reference.    Significant Diagnostic Studies: Ct Abdomen Pelvis W Contrast  Result Date: 01/23/2018 CLINICAL DATA:  Pain and swelling of the scrotum and lower abdomen. Recurrent cellulitis. EXAM: CT ABDOMEN AND PELVIS WITH CONTRAST TECHNIQUE: Multidetector  CT imaging of the abdomen and pelvis was performed using the standard protocol following bolus administration of intravenous contrast. CONTRAST:  ISOVUE-300 IOPAMIDOL (ISOVUE-300) INJECTION 61% COMPARISON:  None. FINDINGS: Lower chest: Normal. Hepatobiliary: No focal liver abnormality is seen. No gallstones, gallbladder wall thickening, or biliary dilatation. Pancreas: Unremarkable. No pancreatic ductal dilatation or surrounding inflammatory changes. Spleen: Normal in size without focal abnormality. Adrenals/Urinary Tract: Adrenal glands are unremarkable. Kidneys are normal, without renal calculi, focal lesion, or hydronephrosis. Bladder is unremarkable. Stomach/Bowel: Stomach is within normal limits. Appendix appears normal. No evidence of bowel wall thickening, distention, or inflammatory changes. Vascular/Lymphatic: No significant vascular findings are  present. There is slightly enlarged lymph nodes in the inguinal regions bilaterally. The largest is 22 mm in diameter on image 104 of series 3 in the left inguinal region. These are most likely reactive secondary to the cellulitis. Reproductive: Prostate is unremarkable. Other: There is prominent subcutaneous edema in the panniculus. This does not extend into the perineum at this time. There is prominent edema and what appears to be prominent hydroceles in the scrotum. The testicles appear normal. There is a 5 cm cyst adjacent to the right testicle which may represent a large epididymal cyst or a loculated hydrocele. Musculoskeletal: No acute abnormality. Severe spinal stenosis at L2-3, L3-4 and L4-5. 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 IMPRESSION: 1. Prominent edema in the panniculus consistent with cellulitis. No extension to the perineum at this time but the patient is at risk for developing Fournier's gangrene. 2. Prominent edema and hydroceles in the scrotum. Possible large right epididymal cyst. 3. Severe multilevel spinal stenosis. Electronically Signed   By: Francene Boyers M.D.   On: 01/23/2018 11:45   Dg Chest Port 1 View  Result Date: 01/23/2018 CLINICAL DATA:  Shortness of breath.  Cough. EXAM: PORTABLE CHEST 1 VIEW COMPARISON:  June 24, 2011 FINDINGS: Limited study due to patient body habitus. Haziness over the bases limits evaluation, likely due to overlapping soft tissues. Stable cardiomegaly. No pneumothorax. No nodules or masses. No overt edema. Suspect pulmonary venous congestion. IMPRESSION: Limited study. Suspect pulmonary venous congestion without overt edema. Electronically Signed   By: Gerome Sam III M.D   On: 01/23/2018 09:07    Microbiology: Recent Results (from the past 240 hour(s))  Culture, blood (routine x 2)     Status: None (Preliminary result)   Collection Time: 01/23/18  9:01 AM  Result Value Ref Range Status   Specimen Description   Final    BLOOD RIGHT  ANTECUBITAL Performed at [facility], [address], [place], [place] [address]    Special Requests   Final    BOTTLES DRAWN AEROBIC ONLY Blood Culture results may not be optimal due to an inadequate volume of blood received in culture bottles Performed at [facility], [address], [place], [place] [address]    Culture   Final    NO GROWTH 1 DAY Performed at [facility], [address] [place], [place] [address]    Report Status PENDING  Incomplete  Culture, blood (routine x 2)     Status: None (Preliminary result)   Collection Time: 01/23/18  9:09 AM  Result Value Ref Range Status   Specimen Description   Final    BLOOD RIGHT HAND Performed at [facility], [address] [place], [place] [address]    Special Requests   Final    BOTTLES DRAWN AEROBIC ONLY Blood Culture adequate  volume Performed at [facility], [address], [place], [place] [address]    Culture   Final    NO GROWTH 1 DAY Performed at [facility], [address] [place], [place] [address]    Report Status PENDING  Incomplete     Labs: Basic Metabolic Panel: Recent Labs  Lab 01/22/18 2341 01/23/18 0602 01/24/18 0518 01/25/18 0554  NA 141 137 137 138  K 4.3 4.3 4.3 4.3  CL 103 101 96* 99  CO2 29 28 31 29   GLUCOSE 121* 103* 96 108*  BUN 16 12 10 13   CREATININE 0.92 0.62 0.75 0.77  CALCIUM 8.6* 8.6* 9.3 8.7*   Liver Function Tests: Recent Labs  Lab 01/23/18 0602  AST 18  ALT 18  ALKPHOS 95  BILITOT 0.8  PROT 7.1  ALBUMIN 3.1*   No results for input(s): LIPASE, AMYLASE in the last 168 hours. No results for input(s): AMMONIA in the last 168 hours. CBC: Recent Labs  Lab 01/22/18 2341 01/23/18 0602 01/24/18 0518 01/25/18 0554  WBC 10.0 9.7 9.4 9.2  NEUTROABS 6.8 6.4 6.5 5.8  HGB 15.1 15.0 16.3 15.2  HCT 46.0 46.5 50.6 48.0  MCV 89.8 90.1 90.7 90.9  PLT 190 189 194 190   Cardiac  Enzymes: No results for input(s): CKTOTAL, CKMB, CKMBINDEX, TROPONINI in the last 168 hours. BNP: BNP (last 3 results) Recent Labs    12/08/17 1647  BNP 19.7    ProBNP (last 3 results) No results for input(s): PROBNP in the last 8760 hours.  CBG: No results for input(s): GLUCAP in the last 168 hours.     Signed:  Briant Cedar, MD [facility] 01/25/2018, 1:16 PM

## 2018-01-25 NOTE — Progress Notes (Signed)
Janae Bridgemanouglas L Lutes to be D/C'd Home per MD order.  Discussed prescriptions and follow up appointments with the patient. Prescriptions given to patient, medication list explained in detail. Pt verbalized understanding.  Allergies as of 01/25/2018      Reactions   Other Hives   Steroid, but cannot remember name.      Medication List    STOP taking these medications   cephALEXin 500 MG capsule Commonly known as:  KEFLEX   oxyCODONE-acetaminophen 5-325 MG tablet Commonly known as:  ROXICET   sulfamethoxazole-trimethoprim 800-160 MG tablet Commonly known as:  BACTRIM DS,SEPTRA DS     TAKE these medications   albuterol 108 (90 Base) MCG/ACT inhaler Commonly known as:  PROVENTIL HFA;VENTOLIN HFA Inhale 2 puffs into the lungs every 6 (six) hours as needed for wheezing or shortness of breath.   amLODipine 10 MG tablet Commonly known as:  NORVASC Take 1 tablet (10 mg total) by mouth daily.   doxycycline 100 MG capsule Commonly known as:  VIBRAMYCIN Take 1 capsule (100 mg total) by mouth 2 (two) times daily for 7 days.   furosemide 40 MG tablet Commonly known as:  LASIX Take 1 tablet (40 mg total) by mouth daily.   lisinopril 5 MG tablet Commonly known as:  PRINIVIL,ZESTRIL Take 1 tablet (5 mg total) by mouth daily.   miconazole nitrate Powd Commonly known as:  MICATIN Apply 1 application topically 2 (two) times daily. Use for 1 week.   nicotine 21 mg/24hr patch Commonly known as:  NICODERM CQ - dosed in mg/24 hours Place 1 patch (21 mg total) onto the skin daily. Start taking on:  01/26/2018       Vitals:   01/24/18 2137 01/25/18 0606  BP:  (!) 154/98  Pulse:  82  Resp:  18  Temp:  (!) 97.5 F (36.4 C)  SpO2: 96% 92%    Skin clean, dry and intact without evidence of skin break down, no evidence of skin tears noted. IV catheter discontinued intact. Site without signs and symptoms of complications. Dressing and pressure applied. Pt denies pain at this time. No  complaints noted.  An After Visit Summary was printed and given to the patient. Patient escorted via WC, and D/C home via private auto.  Mariann BarterKellie Azzam Mehra BSN, RN [employer] [facility] Phone [phone]

## 2018-01-25 NOTE — Progress Notes (Addendum)
  Echocardiogram 2D Echocardiogram has been performed.  Avoid use of definity due to cost if possible.   Gearld Kerstein L Androw 01/25/2018, 9:10 AM

## 2018-01-28 LAB — CULTURE, BLOOD (ROUTINE X 2)
CULTURE: NO GROWTH
CULTURE: NO GROWTH
SPECIAL REQUESTS: ADEQUATE

## 2018-06-13 ENCOUNTER — Other Ambulatory Visit: Payer: Self-pay

## 2018-06-13 ENCOUNTER — Encounter (HOSPITAL_COMMUNITY): Payer: Self-pay | Admitting: Emergency Medicine

## 2018-06-13 ENCOUNTER — Emergency Department (HOSPITAL_COMMUNITY)
Admission: EM | Admit: 2018-06-13 | Discharge: 2018-06-13 | Discharge status: Against Medical Advice | Payer: Medicare Other | Attending: Emergency Medicine | Admitting: Emergency Medicine

## 2018-06-13 DIAGNOSIS — F17210 Nicotine dependence, cigarettes, uncomplicated: Secondary | ICD-10-CM | POA: Diagnosis not present

## 2018-06-13 DIAGNOSIS — L03818 Cellulitis of other sites: Secondary | ICD-10-CM | POA: Diagnosis not present

## 2018-06-13 DIAGNOSIS — I110 Hypertensive heart disease with heart failure: Secondary | ICD-10-CM | POA: Insufficient documentation

## 2018-06-13 DIAGNOSIS — I509 Heart failure, unspecified: Secondary | ICD-10-CM | POA: Insufficient documentation

## 2018-06-13 DIAGNOSIS — N50819 Testicular pain, unspecified: Secondary | ICD-10-CM | POA: Diagnosis present

## 2018-06-13 DIAGNOSIS — Z79899 Other long term (current) drug therapy: Secondary | ICD-10-CM | POA: Diagnosis not present

## 2018-06-13 DIAGNOSIS — N5089 Other specified disorders of the male genital organs: Secondary | ICD-10-CM

## 2018-06-13 MED ORDER — CLINDAMYCIN HCL 150 MG PO CAPS: 450.0000 mg | ORAL_CAPSULE | Freq: Four times a day (QID) | ORAL | 0 refills | Status: DC

## 2018-06-13 MED ORDER — DOXYCYCLINE HYCLATE 100 MG PO TABS
100.0000 mg | ORAL_TABLET | Freq: Once | ORAL | Status: AC
  Administered 2018-06-13: 100 mg via ORAL
  Filled 2018-06-13: qty 1

## 2018-06-13 MED ORDER — CIPROFLOXACIN HCL 500 MG PO TABS: 500.0000 mg | ORAL_TABLET | Freq: Two times a day (BID) | ORAL | 0 refills | Status: DC

## 2018-06-13 NOTE — ED Notes (Signed)
Bed: [id number] Expected date: 06/13/18 Expected time: 7:00 PM Means of arrival:  Comments: Testicular swelling

## 2018-06-13 NOTE — ED Triage Notes (Signed)
Pt arrives via EMS with testicular pain x2 days. Patient states he was bowling when he noticed his leg was swollen and that is when he noticed the testicle swelling. Per EMS may be lymphedema. No pain when laying and testicles supported.

## 2018-06-13 NOTE — ED Notes (Signed)
Spoke with patient, patient does not believe blood work necessary at this time.

## 2018-06-13 NOTE — ED Provider Notes (Signed)
[facility] Provider Note   CSN: [id number] Arrival date & time: 06/13/18  1853     History   Chief Complaint Chief Complaint  Patient presents with  . Testicle Pain    Bilateral    HPI Andre [name] is a 37 y.o. male who presents with testicular pain and swelling. PMH significant for morbid obesity, diastolic CHF, smoking, HTN, recurrent cellulitis and panniculitis. The patient states that over the past several days he has had gradually worsening pain and swelling of his testicles. He has chronic swelling of his lower legs as well but this has actually improved. No fever, abdominal pain, vomiting. He has been able to urinate but penis has retracted. He states that this presentation is very similar as to when he had to be admitted in July. He states he doesn't want to be admitted again because he didn't get better when he was in the hospital. He has been trying epsom salt soaks for several days without relief.  HPI  Past Medical History:  Diagnosis Date  . Anemia    iron deficient  . Cellulitis    lower extremities  . Cellulitis of scrotum    History of  . CHF (congestive heart failure) (HCC)   . History of acute bronchitis with bronchospasm   . Hypertension   . Morbid obesity (HCC)   . Obesity hypoventilation syndrome (HCC)   . Obstructive sleep apnea   . Panniculitis    History of  . Sleep apnea     Patient Active Problem List   Diagnosis Date Noted  . Cellulitis of scrotum 01/23/2018  . Cellulitis of abdominal wall 01/23/2018  . Leukocytosis   . Cellulitis of left leg 01/13/2015  . Cellulitis 01/13/2015  . TOBACCO ABUSE 03/23/2009  . EDEMA 03/23/2009  . Morbid obesity (HCC) 11/12/2008  . OBESITY HYPOVENTILATION SYNDROME 11/12/2008  . Obstructive sleep apnea 11/12/2008  . Essential hypertension 11/12/2008  . CELLULITIS, LEG, LEFT 11/12/2008    History reviewed. No pertinent surgical history.      Home  Medications    Prior to Admission medications   Medication Sig Start Date End Date Taking? Authorizing Provider  albuterol (PROVENTIL HFA;VENTOLIN HFA) 108 (90 Base) MCG/ACT inhaler Inhale 2 puffs into the lungs every 6 (six) hours as needed for wheezing or shortness of breath. 01/25/18   Briant CedarEzenduka, Nkeiruka J, MD  amLODipine (NORVASC) 10 MG tablet Take 1 tablet (10 mg total) by mouth daily. 01/25/18 02/24/18  Briant CedarEzenduka, Nkeiruka J, MD  furosemide (LASIX) 40 MG tablet Take 1 tablet (40 mg total) by mouth daily. 01/25/18   Briant CedarEzenduka, Nkeiruka J, MD  lisinopril (PRINIVIL,ZESTRIL) 5 MG tablet Take 1 tablet (5 mg total) by mouth daily. 01/25/18   Briant CedarEzenduka, Nkeiruka J, MD  miconazole nitrate (MICATIN) POWD Apply 1 application topically 2 (two) times daily. Use for 1 week. 01/25/18   Briant CedarEzenduka, Nkeiruka J, MD  nicotine (NICODERM CQ - DOSED IN MG/24 HOURS) 21 mg/24hr patch Place 1 patch (21 mg total) onto the skin daily. 01/26/18   Briant CedarEzenduka, Nkeiruka J, MD    Family History No family history on file.  Social History Social History   Tobacco Use  . Smoking status: Current Every Day Smoker    Packs/day: 1.00    Years: 9.00    Pack years: 9.00  . Smokeless tobacco: Never Used  Substance Use Topics  . Alcohol use: Yes  . Drug use: No     Allergies   Other  Review of Systems Review of Systems  Constitutional: Negative for fever.  Respiratory: Negative for shortness of breath.   Cardiovascular: Negative for chest pain.  Gastrointestinal: Negative for abdominal pain, nausea and vomiting.  Genitourinary: Positive for scrotal swelling and testicular pain. Negative for difficulty urinating, dysuria, frequency and penile swelling.  All other systems reviewed and are negative.    Physical Exam Updated Vital Signs BP (!) 167/94 (BP Location: Left Arm)   Pulse 81   Temp 99.2 F (37.3 C) (Oral)   Resp 18   SpO2 92%   Physical Exam  Constitutional: He is oriented to person, place, and time. He  appears well-developed and well-nourished. No distress.  Calm, cooperative. Morbid obesity  HENT:  Head: Normocephalic and atraumatic.  Eyes: Pupils are equal, round, and reactive to light. Conjunctivae are normal. Right eye exhibits no discharge. Left eye exhibits no discharge. No scleral icterus.  Neck: Normal range of motion.  Cardiovascular: Normal rate and regular rhythm.  Pulmonary/Chest: Effort normal. No respiratory distress. He has wheezes.  Abdominal: Soft. Bowel sounds are normal. He exhibits no distension. There is no tenderness.  Intertrigo over the right pannus which appears acutely infected and malodorous  Genitourinary:  Genitourinary Comments: Significant amount of redness, tenderness, and edema of the testicles. Penis is not visualized. Chaperone present during exam  Neurological: He is alert and oriented to person, place, and time.  Skin: Skin is warm and dry.  Psychiatric: He has a normal mood and affect. His behavior is normal.  Nursing note and vitals reviewed.    ED Treatments / Results  Labs (all labs ordered are listed, but only abnormal results are displayed) Labs Reviewed - No data to display  EKG None  Radiology No results found.  Procedures Procedures (including critical care time)  Medications Ordered in ED Medications  doxycycline (VIBRA-TABS) tablet 100 mg (100 mg Oral Given 06/13/18 1944)     Initial Impression / Assessment and Plan / ED Course  I have reviewed the triage vital signs and the nursing notes.  Pertinent labs & imaging results that were available during my care of the patient were reviewed by me and considered in my medical decision making (see chart for details).  37 year old male presents with worsening testicular pain and swelling for the past couple days. Presentation is consistent with prior episodes of cellulitis. He is hypertensive but otherwise vitals are normal. On exam he has a significant amount of testicular  swelling and what appears to be intertrigo of the pannus. Initially we discussed blood work and oral antibitiotics because he doesn't want to stay for a work up. 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 He also felt like IV antibiotics did not help him when he was admitted although it was documented that he was improving and felt better while in the hospital. Shared visit with Dr. Pilar Plate. Ultimately we decided to forego labs and imaging since the patient does not want this and he will be leaving AMA. He will be given an rx for Cipro and Clindamycin. Return precautions given.  Final Clinical Impressions(s) / ED Diagnoses   Final diagnoses:  Cellulitis of other specified site  Testicular swelling    ED Discharge Orders    None       Bethel Born, PA-C 06/13/18 2223    Sabas Sous, MD 06/14/18 [phone]

## 2018-06-13 NOTE — Discharge Instructions (Addendum)
Please take Cipro and Clindamycin as directed Return if you are worsening

## 2018-06-22 ENCOUNTER — Encounter (HOSPITAL_COMMUNITY): Payer: Self-pay | Admitting: Emergency Medicine

## 2018-06-22 ENCOUNTER — Emergency Department (HOSPITAL_COMMUNITY)
Admission: EM | Admit: 2018-06-22 | Discharge: 2018-06-23 | Disposition: A | Discharge status: Home / Self Care | Payer: Medicare Other | Attending: Emergency Medicine | Admitting: Emergency Medicine

## 2018-06-22 DIAGNOSIS — I110 Hypertensive heart disease with heart failure: Secondary | ICD-10-CM | POA: Diagnosis not present

## 2018-06-22 DIAGNOSIS — Z9114 Patient's other noncompliance with medication regimen: Secondary | ICD-10-CM | POA: Insufficient documentation

## 2018-06-22 DIAGNOSIS — I509 Heart failure, unspecified: Secondary | ICD-10-CM | POA: Insufficient documentation

## 2018-06-22 DIAGNOSIS — I10 Essential (primary) hypertension: Secondary | ICD-10-CM

## 2018-06-22 DIAGNOSIS — N5089 Other specified disorders of the male genital organs: Secondary | ICD-10-CM | POA: Insufficient documentation

## 2018-06-22 DIAGNOSIS — F17200 Nicotine dependence, unspecified, uncomplicated: Secondary | ICD-10-CM | POA: Insufficient documentation

## 2018-06-22 NOTE — ED Triage Notes (Signed)
BIB EMS from home, pt reports pain and swelling to groin/testicle area. [facility] for [facility]. Dx with cellulitis, started on abx. Pt states he has seen no improvement.

## 2018-06-22 NOTE — ED Notes (Signed)
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  Antibiotics given last week without relief.

## 2018-06-22 NOTE — ED Provider Notes (Signed)
[facility] EMERGENCY DEPARTMENT Provider Note   CSN: [id number] Arrival date & time: 06/22/18  2146     History   Chief Complaint Chief Complaint  Patient presents with  . Groin Pain    HPI Andre [name] is a 37 y.o. male.   36 year old male with a history of CHF, hypertension presents to the emergency department for evaluation of 3 weeks of scrotal swelling.  States that swelling has been fairly constant and is more uncomfortable when he is up and ambulatory.  Feels as though the swelling decreases slightly with Epsom salt soaks as well as when lying supine.  Was seen for these complaints on 06/13/2018 at [facility].  Was started on ciprofloxacin and clindamycin over concern for scrotal cellulitis.  He has completed a weeklong course of these antibiotics and has not noted any improvement to his swelling.  He has never had any fevers.  Denies any difficulty voiding, dysuria, nausea, vomiting.     Past Medical History:  Diagnosis Date  . Anemia    iron deficient  . Cellulitis    lower extremities  . Cellulitis of scrotum    History of  . CHF (congestive heart failure) (HCC)   . History of acute bronchitis with bronchospasm   . Hypertension   . Morbid obesity (HCC)   . Obesity hypoventilation syndrome (HCC)   . Obstructive sleep apnea   . Panniculitis    History of  . Sleep apnea     Patient Active Problem List   Diagnosis Date Noted  . Cellulitis of scrotum 01/23/2018  . Cellulitis of abdominal wall 01/23/2018  . Leukocytosis   . Cellulitis of left leg 01/13/2015  . Cellulitis 01/13/2015  . TOBACCO ABUSE 03/23/2009  . EDEMA 03/23/2009  . Morbid obesity (HCC) 11/12/2008  . OBESITY HYPOVENTILATION SYNDROME 11/12/2008  . Obstructive sleep apnea 11/12/2008  . Essential hypertension 11/12/2008  . CELLULITIS, LEG, LEFT 11/12/2008    History reviewed. No pertinent surgical history.      Home Medications    Prior to Admission  medications   Medication Sig Start Date End Date Taking? Authorizing Provider  ibuprofen (ADVIL,MOTRIN) 200 MG tablet Take 600 mg by mouth every 6 (six) hours as needed for moderate pain.   Yes [provider]  albuterol (PROVENTIL HFA;VENTOLIN HFA) 108 (90 Base) MCG/ACT inhaler Inhale 2 puffs into the lungs every 6 (six) hours as needed for wheezing or shortness of breath. Patient not taking: Reported on 06/13/2018 01/25/18   Briant Cedar, MD  amLODipine (NORVASC) 10 MG tablet Take 1 tablet (10 mg total) by mouth daily. Patient not taking: Reported on 06/22/2018 01/25/18 02/24/18  Briant Cedar, MD  ciprofloxacin (CIPRO) 500 MG tablet Take 1 tablet (500 mg total) by mouth 2 (two) times daily. Patient not taking: Reported on 06/22/2018 06/13/18   Bethel Born, PA-C  clindamycin (CLEOCIN) 150 MG capsule Take 3 capsules (450 mg total) by mouth every 6 (six) hours. Patient not taking: Reported on 06/22/2018 06/13/18   Bethel Born, PA-C  furosemide (LASIX) 40 MG tablet Take 1 tablet (40 mg total) by mouth daily. 06/23/18   Antony Madura, PA-C  lisinopril (PRINIVIL,ZESTRIL) 5 MG tablet Take 1 tablet (5 mg total) by mouth daily. 06/23/18   Antony Madura, PA-C  miconazole nitrate (MICATIN) POWD Apply 1 application topically 2 (two) times daily. Use for 1 week. Patient not taking: Reported on 06/13/2018 01/25/18   Briant Cedar, MD  nicotine (NICODERM  CQ - DOSED IN MG/24 HOURS) 21 mg/24hr patch Place 1 patch (21 mg total) onto the skin daily. Patient not taking: Reported on 06/13/2018 01/26/18   Briant CedarEzenduka, Nkeiruka J, MD    Family History No family history on file.  Social History Social History   Tobacco Use  . Smoking status: Current Every Day Smoker    Packs/day: 1.00    Years: 9.00    Pack years: 9.00  . Smokeless tobacco: Never Used  Substance Use Topics  . Alcohol use: Yes  . Drug use: No     Allergies   Other   Review of Systems Review of  Systems Ten systems reviewed and are negative for acute change, except as noted in the HPI.    Physical Exam Updated Vital Signs BP (!) 157/96 (BP Location: Left Arm)   Pulse 94   Temp 98.4 F (36.9 C) (Oral)   Resp 20   SpO2 95%   Physical Exam  Constitutional: He is oriented to person, place, and time. He appears well-developed and well-nourished. No distress.  Super morbidly obese. Nontoxic appearing. Pleasant.   HENT:  Head: Normocephalic and atraumatic.  Eyes: Conjunctivae and EOM are normal. No scleral icterus.  Neck: Normal range of motion.  Cardiovascular: Normal rate, regular rhythm and intact distal pulses.  Pulmonary/Chest: Effort normal. No stridor. No respiratory distress.  Respirations even and unlabored  Genitourinary:  Genitourinary Comments: Edematous scrotum with mild erythema. No drainage, heat to touch. Unable to visualize penis due to edema. Edema extends to lower pannus.  Musculoskeletal: Normal range of motion.  Neurological: He is alert and oriented to person, place, and time. He exhibits normal muscle tone. Coordination normal.  GCS 15. Patient moving all extremities.  Skin: Skin is warm and dry. No rash noted. He is not diaphoretic. No erythema. No pallor.  Psychiatric: He has a normal mood and affect. His behavior is normal.  Nursing note and vitals reviewed.    ED Treatments / Results  Labs (all labs ordered are listed, but only abnormal results are displayed) Labs Reviewed  CBC WITH DIFFERENTIAL/PLATELET - Abnormal; Notable for the following components:      Result Value   Monocytes Absolute 1.1 (*)    All other components within normal limits  BASIC METABOLIC PANEL - Abnormal; Notable for the following components:   Glucose, Bld 118 (*)    Calcium 8.8 (*)    All other components within normal limits    EKG None  Radiology No results found.  Procedures Procedures (including critical care time)  Medications Ordered in  ED Medications  furosemide (LASIX) injection 40 mg (40 mg Intravenous Given 06/23/18 0102)     Initial Impression / Assessment and Plan / ED Course  I have reviewed the triage vital signs and the nursing notes.  Pertinent labs & imaging results that were available during my care of the patient were reviewed by me and considered in my medical decision making (see chart for details).      37 year old male presents to the emergency department for 3 weeks of edema to his scrotum and lower pannus.  Completed a one-week course of ciprofloxacin and clindamycin without any change in the degree of his edema.  Feels that it does slightly improve when supine and with scrotal elevation.  He has also tried Epsom salt soaks with subjective improvement.  On exam, patient noted to have significantly edematous scrotum.  Unable to visualize penis as a result.  There is mild erythema  appreciated to be secondary to degree of edema as there is no heat to touch, lymphangitic streaking to suggest infectious etiology.  The patient has never had any fevers with the swelling.  He has no leukocytosis or left shift today.  History also favors edema given improvement when supine and with scrotal elevation.  I do not believe additional antibiotics are indicated.  He was given 1 dose of Lasix IV in the ED.  He has been noncompliant with his blood pressure medications and daily Lasix for many months.  Will restart on 5 mg lisinopril daily and 40 mg Lasix daily.  He has been advised to follow-up with his primary care doctor within the week for recheck of his blood pressure as well as for repeat assessment of his scrotal swelling.  Return precautions discussed and provided. Patient discharged in stable condition with no unaddressed concerns.  Vitals:   06/23/18 0000 06/23/18 0015 06/23/18 0030 06/23/18 0115  BP: (!) 150/96 (!) 144/85 (!) 155/80 (!) 157/96  Pulse: 81 83 80 94  Resp:    20  Temp:      TempSrc:      SpO2: 94%  93% 94% 95%    Final Clinical Impressions(s) / ED Diagnoses   Final diagnoses:  Scrotal edema  Essential hypertension    ED Discharge Orders         Ordered    lisinopril (PRINIVIL,ZESTRIL) 5 MG tablet  Daily     06/23/18 0109    furosemide (LASIX) 40 MG tablet  Daily     06/23/18 0109           Antony Madura, PA-C 06/23/18 0144    Zadie Rhine, MD 06/23/18 [phone]

## 2018-06-23 LAB — BASIC METABOLIC PANEL
Anion gap: 8 (ref 5–15)
BUN: 12 mg/dL (ref 6–20)
CALCIUM: 8.8 mg/dL — AB (ref 8.9–10.3)
CO2: 32 mmol/L (ref 22–32)
Chloride: 98 mmol/L (ref 98–111)
Creatinine, Ser: 0.79 mg/dL (ref 0.61–1.24)
GFR calc Af Amer: >60 mL/min (ref >60)
GFR calc non Af Amer: >60 mL/min (ref >60)
Glucose, Bld: 118 mg/dL — ABNORMAL HIGH (ref 70–99)
Potassium: 3.8 mmol/L (ref 3.5–5.1)
Sodium: 138 mmol/L (ref 135–145)

## 2018-06-23 LAB — CBC WITH DIFFERENTIAL/PLATELET
Abs Immature Granulocytes: 0.03 10*3/uL (ref 0.00–0.07)
Basophils Absolute: 0 10*3/uL (ref 0.0–0.1)
Basophils Relative: 0 %
EOS PCT: 2 %
Eosinophils Absolute: 0.2 10*3/uL (ref 0.0–0.5)
HCT: 49.6 % (ref 39.0–52.0)
Hemoglobin: 15.2 g/dL (ref 13.0–17.0)
Immature Granulocytes: 0 %
Lymphocytes Relative: 17 %
Lymphs Abs: 1.5 10*3/uL (ref 0.7–4.0)
MCH: 27 pg (ref 26.0–34.0)
MCHC: 30.6 g/dL (ref 30.0–36.0)
MCV: 87.9 fL (ref 80.0–100.0)
Monocytes Absolute: 1.1 10*3/uL — ABNORMAL HIGH (ref 0.1–1.0)
Monocytes Relative: 12 %
Neutro Abs: 6.1 10*3/uL (ref 1.7–7.7)
Neutrophils Relative %: 69 %
Platelets: 167 10*3/uL (ref 150–400)
RBC: 5.64 MIL/uL (ref 4.22–5.81)
RDW: 14.2 % (ref 11.5–15.5)
WBC: 8.9 10*3/uL (ref 4.0–10.5)
nRBC: 0 % (ref 0.0–0.2)

## 2018-06-23 MED ORDER — LISINOPRIL 5 MG PO TABS: 5.0000 mg | ORAL_TABLET | Freq: Every day | ORAL | 0 refills | Status: AC

## 2018-06-23 MED ORDER — FUROSEMIDE 40 MG PO TABS: 40.0000 mg | ORAL_TABLET | Freq: Every day | ORAL | 0 refills | Status: DC

## 2018-06-23 MED ORDER — FUROSEMIDE 10 MG/ML IJ SOLN
40.0000 mg | INTRAMUSCULAR | Status: AC
  Administered 2018-06-23: 40 mg via INTRAVENOUS
  Filled 2018-06-23: qty 4

## 2018-06-23 NOTE — ED Notes (Signed)
Reviewed d/c instructions with pt, who verbalized understanding and had no outstanding questions. Pt departed in NAD.   

## 2018-06-23 NOTE — Discharge Instructions (Signed)
Your scrotal swelling appears to be from worsening edema which is fluid that has collected in your scrotal region.  You have been started back on lisinopril and Lasix.  Take as prescribed.  Try to decrease your intake of salt and processed food.  We recommend follow-up with your primary care doctor in 1 week for repeat evaluation of the area as well as for repeat blood pressure check.  Return to the ED if symptoms worsen, such as if you develop chills, fever over 100.30F, difficulty urinating.

## 2018-07-15 ENCOUNTER — Emergency Department (HOSPITAL_COMMUNITY): Payer: Medicare Other

## 2018-07-15 ENCOUNTER — Observation Stay (HOSPITAL_COMMUNITY): Payer: Medicare Other

## 2018-07-15 ENCOUNTER — Other Ambulatory Visit: Payer: Self-pay

## 2018-07-15 ENCOUNTER — Inpatient Hospital Stay (HOSPITAL_COMMUNITY)
Admission: EM | Admit: 2018-07-15 | Discharge: 2018-07-20 | DRG: 871 | Disposition: A | Discharge status: Home / Self Care | Payer: Medicare Other | Attending: Internal Medicine | Admitting: Internal Medicine

## 2018-07-15 ENCOUNTER — Encounter (HOSPITAL_COMMUNITY): Payer: Self-pay | Admitting: Emergency Medicine

## 2018-07-15 DIAGNOSIS — A419 Sepsis, unspecified organism: Secondary | ICD-10-CM | POA: Diagnosis present

## 2018-07-15 DIAGNOSIS — I10 Essential (primary) hypertension: Secondary | ICD-10-CM | POA: Diagnosis not present

## 2018-07-15 DIAGNOSIS — Z79899 Other long term (current) drug therapy: Secondary | ICD-10-CM

## 2018-07-15 DIAGNOSIS — G4733 Obstructive sleep apnea (adult) (pediatric): Secondary | ICD-10-CM | POA: Diagnosis not present

## 2018-07-15 DIAGNOSIS — Z8249 Family history of ischemic heart disease and other diseases of the circulatory system: Secondary | ICD-10-CM

## 2018-07-15 DIAGNOSIS — I5033 Acute on chronic diastolic (congestive) heart failure: Secondary | ICD-10-CM | POA: Diagnosis present

## 2018-07-15 DIAGNOSIS — L03116 Cellulitis of left lower limb: Secondary | ICD-10-CM | POA: Diagnosis present

## 2018-07-15 DIAGNOSIS — I110 Hypertensive heart disease with heart failure: Secondary | ICD-10-CM | POA: Diagnosis present

## 2018-07-15 DIAGNOSIS — L538 Other specified erythematous conditions: Secondary | ICD-10-CM

## 2018-07-15 DIAGNOSIS — D696 Thrombocytopenia, unspecified: Secondary | ICD-10-CM

## 2018-07-15 DIAGNOSIS — F17210 Nicotine dependence, cigarettes, uncomplicated: Secondary | ICD-10-CM | POA: Diagnosis present

## 2018-07-15 DIAGNOSIS — R609 Edema, unspecified: Secondary | ICD-10-CM | POA: Diagnosis not present

## 2018-07-15 DIAGNOSIS — M7989 Other specified soft tissue disorders: Secondary | ICD-10-CM

## 2018-07-15 DIAGNOSIS — M79609 Pain in unspecified limb: Secondary | ICD-10-CM | POA: Diagnosis not present

## 2018-07-15 DIAGNOSIS — J9601 Acute respiratory failure with hypoxia: Secondary | ICD-10-CM | POA: Diagnosis not present

## 2018-07-15 DIAGNOSIS — E662 Morbid (severe) obesity with alveolar hypoventilation: Secondary | ICD-10-CM | POA: Diagnosis present

## 2018-07-15 DIAGNOSIS — Z888 Allergy status to other drugs, medicaments and biological substances status: Secondary | ICD-10-CM

## 2018-07-15 DIAGNOSIS — Z6841 Body Mass Index (BMI) 40.0 and over, adult: Secondary | ICD-10-CM | POA: Diagnosis not present

## 2018-07-15 DIAGNOSIS — J9621 Acute and chronic respiratory failure with hypoxia: Secondary | ICD-10-CM | POA: Diagnosis present

## 2018-07-15 DIAGNOSIS — L0390 Cellulitis, unspecified: Secondary | ICD-10-CM

## 2018-07-15 DIAGNOSIS — E876 Hypokalemia: Secondary | ICD-10-CM | POA: Diagnosis present

## 2018-07-15 LAB — URINALYSIS, ROUTINE W REFLEX MICROSCOPIC
Bilirubin Urine: NEGATIVE
Glucose, UA: NEGATIVE mg/dL
Ketones, ur: NEGATIVE mg/dL
Leukocytes, UA: NEGATIVE
NITRITE: NEGATIVE
Protein, ur: 30 mg/dL — AB
Specific Gravity, Urine: 1.008 (ref 1.005–1.030)
pH: 5 (ref 5.0–8.0)

## 2018-07-15 LAB — COMPREHENSIVE METABOLIC PANEL
ALT: 19 U/L (ref 0–44)
AST: 23 U/L (ref 15–41)
Albumin: 3 g/dL — ABNORMAL LOW (ref 3.5–5.0)
Alkaline Phosphatase: 74 U/L (ref 38–126)
Anion gap: 10 (ref 5–15)
BUN: 13 mg/dL (ref 6–20)
CO2: 27 mmol/L (ref 22–32)
Calcium: 8.3 mg/dL — ABNORMAL LOW (ref 8.9–10.3)
Chloride: 98 mmol/L (ref 98–111)
Creatinine, Ser: 0.84 mg/dL (ref 0.61–1.24)
GFR calc Af Amer: >60 mL/min (ref >60)
GFR calc non Af Amer: >60 mL/min (ref >60)
Glucose, Bld: 119 mg/dL — ABNORMAL HIGH (ref 70–99)
POTASSIUM: 3.4 mmol/L — AB (ref 3.5–5.1)
Sodium: 135 mmol/L (ref 135–145)
Total Bilirubin: 1.3 mg/dL — ABNORMAL HIGH (ref 0.3–1.2)
Total Protein: 6.5 g/dL (ref 6.5–8.1)

## 2018-07-15 LAB — CBC WITH DIFFERENTIAL/PLATELET
Abs Immature Granulocytes: 0.56 10*3/uL — ABNORMAL HIGH (ref 0.00–0.07)
Basophils Absolute: 0.1 10*3/uL (ref 0.0–0.1)
Basophils Relative: 0 %
Eosinophils Absolute: 0 10*3/uL (ref 0.0–0.5)
Eosinophils Relative: 0 %
HCT: 49.2 % (ref 39.0–52.0)
Hemoglobin: 14.9 g/dL (ref 13.0–17.0)
IMMATURE GRANULOCYTES: 2 %
LYMPHS ABS: 0.4 10*3/uL — AB (ref 0.7–4.0)
Lymphocytes Relative: 1 %
MCH: 26.3 pg (ref 26.0–34.0)
MCHC: 30.3 g/dL (ref 30.0–36.0)
MCV: 86.9 fL (ref 80.0–100.0)
Monocytes Absolute: 1.2 10*3/uL — ABNORMAL HIGH (ref 0.1–1.0)
Monocytes Relative: 4 %
Neutro Abs: 27.4 10*3/uL — ABNORMAL HIGH (ref 1.7–7.7)
Neutrophils Relative %: 93 %
PLATELETS: 166 10*3/uL (ref 150–400)
RBC: 5.66 MIL/uL (ref 4.22–5.81)
RDW: 14.8 % (ref 11.5–15.5)
WBC Morphology: INCREASED
WBC: 29.7 10*3/uL — ABNORMAL HIGH (ref 4.0–10.5)
nRBC: 0 % (ref 0.0–0.2)

## 2018-07-15 LAB — I-STAT CG4 LACTIC ACID, ED: LACTIC ACID, VENOUS: 2.53 mmol/L — AB (ref 0.5–1.9)

## 2018-07-15 LAB — CBG MONITORING, ED: Glucose-Capillary: 104 mg/dL — ABNORMAL HIGH (ref 70–99)

## 2018-07-15 LAB — LACTIC ACID, PLASMA
Lactic Acid, Venous: 1.3 mmol/L (ref 0.5–1.9)
Lactic Acid, Venous: 1.3 mmol/L (ref 0.5–1.9)

## 2018-07-15 MED ORDER — SODIUM CHLORIDE 0.9% FLUSH: 3.0000 mL | INTRAVENOUS | Status: DC | PRN

## 2018-07-15 MED ORDER — POTASSIUM CHLORIDE CRYS ER 20 MEQ PO TBCR
20.0000 meq | EXTENDED_RELEASE_TABLET | Freq: Two times a day (BID) | ORAL | Status: AC
  Administered 2018-07-15 (×2): 20 meq via ORAL
  Filled 2018-07-15 (×2): qty 1

## 2018-07-15 MED ORDER — MORPHINE SULFATE (PF) 4 MG/ML IV SOLN
4.0000 mg | Freq: Once | INTRAVENOUS | Status: AC
  Administered 2018-07-15: 4 mg via INTRAVENOUS
  Filled 2018-07-15: qty 1

## 2018-07-15 MED ORDER — VANCOMYCIN HCL 10 G IV SOLR
1500.0000 mg | Freq: Once | INTRAVENOUS | Status: AC
  Administered 2018-07-15: 1500 mg via INTRAVENOUS
  Filled 2018-07-15: qty 1500

## 2018-07-15 MED ORDER — ACETAMINOPHEN 650 MG RE SUPP: 650.0000 mg | Freq: Four times a day (QID) | RECTAL | Status: DC | PRN

## 2018-07-15 MED ORDER — ONDANSETRON HCL 4 MG PO TABS: 4.0000 mg | ORAL_TABLET | Freq: Four times a day (QID) | ORAL | Status: DC | PRN

## 2018-07-15 MED ORDER — SODIUM CHLORIDE 0.9 % IV SOLN
2.0000 g | Freq: Once | INTRAVENOUS | Status: AC
  Administered 2018-07-15: 2 g via INTRAVENOUS
  Filled 2018-07-15: qty 2

## 2018-07-15 MED ORDER — SENNOSIDES-DOCUSATE SODIUM 8.6-50 MG PO TABS: 1.0000 | ORAL_TABLET | Freq: Every evening | ORAL | Status: DC | PRN

## 2018-07-15 MED ORDER — SODIUM CHLORIDE 0.9 % IV SOLN: 250.0000 mL | INTRAVENOUS | Status: DC | PRN

## 2018-07-15 MED ORDER — METRONIDAZOLE IN NACL 5-0.79 MG/ML-% IV SOLN
500.0000 mg | Freq: Three times a day (TID) | INTRAVENOUS | Status: DC
  Administered 2018-07-15 – 2018-07-16 (×4): 500 mg via INTRAVENOUS
  Filled 2018-07-15 (×5): qty 100

## 2018-07-15 MED ORDER — SODIUM CHLORIDE 0.9 % IV BOLUS
1000.0000 mL | Freq: Once | INTRAVENOUS | Status: AC
  Administered 2018-07-15: 1000 mL via INTRAVENOUS

## 2018-07-15 MED ORDER — HYDROCODONE-ACETAMINOPHEN 5-325 MG PO TABS
1.0000 | ORAL_TABLET | ORAL | Status: DC | PRN
  Administered 2018-07-17 – 2018-07-18 (×4): 2 via ORAL
  Filled 2018-07-15 (×5): qty 2

## 2018-07-15 MED ORDER — ACETAMINOPHEN 325 MG PO TABS
650.0000 mg | ORAL_TABLET | Freq: Four times a day (QID) | ORAL | Status: DC | PRN
  Administered 2018-07-16 – 2018-07-17 (×2): 650 mg via ORAL
  Filled 2018-07-15 (×2): qty 2

## 2018-07-15 MED ORDER — FUROSEMIDE 10 MG/ML IJ SOLN
40.0000 mg | Freq: Two times a day (BID) | INTRAMUSCULAR | Status: DC
  Administered 2018-07-15 – 2018-07-19 (×10): 40 mg via INTRAVENOUS
  Filled 2018-07-15 (×12): qty 4

## 2018-07-15 MED ORDER — SODIUM CHLORIDE 0.9% FLUSH
3.0000 mL | Freq: Two times a day (BID) | INTRAVENOUS | Status: DC
  Administered 2018-07-15 – 2018-07-19 (×5): 3 mL via INTRAVENOUS

## 2018-07-15 MED ORDER — VANCOMYCIN HCL IN DEXTROSE 1-5 GM/200ML-% IV SOLN
1000.0000 mg | Freq: Once | INTRAVENOUS | Status: AC
  Administered 2018-07-15: 1000 mg via INTRAVENOUS
  Filled 2018-07-15: qty 200

## 2018-07-15 MED ORDER — SODIUM CHLORIDE 0.9 % IV SOLN
2.0000 g | Freq: Three times a day (TID) | INTRAVENOUS | Status: DC
  Administered 2018-07-15 – 2018-07-16 (×3): 2 g via INTRAVENOUS
  Filled 2018-07-15 (×4): qty 2

## 2018-07-15 MED ORDER — ONDANSETRON HCL 4 MG/2ML IJ SOLN: 4.0000 mg | Freq: Four times a day (QID) | INTRAMUSCULAR | Status: DC | PRN

## 2018-07-15 MED ORDER — SODIUM CHLORIDE 0.9% FLUSH
3.0000 mL | Freq: Two times a day (BID) | INTRAVENOUS | Status: DC
  Administered 2018-07-15 – 2018-07-19 (×6): 3 mL via INTRAVENOUS

## 2018-07-15 MED ORDER — VANCOMYCIN HCL 10 G IV SOLR
1500.0000 mg | Freq: Three times a day (TID) | INTRAVENOUS | Status: DC
  Administered 2018-07-15 – 2018-07-16 (×3): 1500 mg via INTRAVENOUS
  Filled 2018-07-15 (×6): qty 1500

## 2018-07-15 MED ORDER — KETOROLAC TROMETHAMINE 30 MG/ML IJ SOLN
30.0000 mg | Freq: Four times a day (QID) | INTRAMUSCULAR | Status: AC | PRN
  Administered 2018-07-17 – 2018-07-19 (×2): 30 mg via INTRAVENOUS
  Filled 2018-07-15 (×2): qty 1

## 2018-07-15 NOTE — H&P (Signed)
History and Physical    Andre BridgemanDouglas L Courser AVW:[id number] DOB: Jun 17, 1981 DOA: 07/15/2018  PCP: Annita BrodAsenso, Philip, MD   Patient coming from: Home   Chief Complaint: Chills; left lower leg tender, hot, red, swollen  HPI: Andre BridgemanDouglas L Kobrin is a 38 y.o. male with medical history significant for obesity (BMI 74), chronic diastolic CHF, hypertension, OSA, and recurrent cellulitis, now presenting to the emergency department with acute onset of chills with swelling, redness, heat, and tenderness involving the lower left leg.  Patient has had recurrent episodes of cellulitis involving the lower extremities, abdominal wall, and scrotum, completed a course of ciprofloxacin and clindamycin approximately 1 month ago for suspected scrotal cellulitis that has since resolved.  He had been in his usual state until roughly 10 hours ago when he developed shaking chills.  When he tried to stand up, he had severe pain in the left calf and noted that his chronic left leg swelling had increased, the calf was red, hot, and tender.  He has a thick callus with fissure at the lateral aspect of his left foot, but this has not been draining and has not changed recently.  Denies any drainage from the left calf or foot, and denies any other areas of erythema or boils at this time.  Reports some chronic shortness of breath and chronic leg swelling, particularly on the left.  Denies any chest pain.  Denies any significant cough.  He has only been using his blood pressure medications and diuretic sporadically.  ED Course: Upon arrival to the ED, patient is found to be febrile to 38.6, saturating upper 80s on room air, slightly tachypneic, tachycardic to 120, and with stable blood pressure.  Chest x-ray is notable for vascular congestion with increased interstitial markings, possibly reflecting mild interstitial edema.  Chemistry panel is notable for a slight hypokalemia.  CBC features a leukocytosis to 29,700 with increased band forms.   Lactic acid is elevated to 2.53.  Patient was given a liter of normal saline, morphine, and vancomycin in the ED.  Blood pressure remained stable, patient is not in acute respiratory distress, and he will be observed on the telemetry unit for ongoing evaluation and management of sepsis secondary to left lower leg cellulitis.  Review of Systems:  All other systems reviewed and apart from HPI, are negative.  Past Medical History:  Diagnosis Date  . Anemia    iron deficient  . Cellulitis    lower extremities  . Cellulitis of scrotum    History of  . CHF (congestive heart failure) (HCC)   . History of acute bronchitis with bronchospasm   . Hypertension   . Morbid obesity (HCC)   . Obesity hypoventilation syndrome (HCC)   . Obstructive sleep apnea   . Panniculitis    History of  . Sleep apnea     History reviewed. No pertinent surgical history.   reports that he has been smoking. He has a 9.00 pack-year smoking history. He has never used smokeless tobacco. He reports current alcohol use. He reports that he does not use drugs.  Allergies  Allergen Reactions  . Other Hives    Steroid, but cannot remember name.    Family History  Problem Relation Age of Onset  . Hypertension Other      Prior to Admission medications   Medication Sig Start Date End Date Taking? Authorizing Provider  furosemide (LASIX) 40 MG tablet Take 1 tablet (40 mg total) by mouth daily. 06/23/18  Yes Antony MaduraHumes, Kelly, PA-C  ibuprofen (ADVIL,MOTRIN) 200 MG tablet Take 600 mg by mouth every 6 (six) hours as needed for moderate pain.   Yes [provider]  lisinopril (PRINIVIL,ZESTRIL) 5 MG tablet Take 1 tablet (5 mg total) by mouth daily. 06/23/18  Yes Antony Madura, PA-C  albuterol (PROVENTIL HFA;VENTOLIN HFA) 108 (90 Base) MCG/ACT inhaler Inhale 2 puffs into the lungs every 6 (six) hours as needed for wheezing or shortness of breath. Patient not taking: Reported on 06/13/2018 01/25/18   Briant Cedar, MD  amLODipine (NORVASC) 10 MG tablet Take 1 tablet (10 mg total) by mouth daily. Patient not taking: Reported on 06/22/2018 01/25/18 02/24/18  Briant Cedar, MD    Physical Exam: Vitals:   07/15/18 0251 07/15/18 0300 07/15/18 0301 07/15/18 0448  BP:  (!) 130/53  (!) 126/52  Pulse:  (!) 114  (!) 115  Resp:  (!) 24  20  Temp:  (!) 101.5 F (38.6 C)    TempSrc:  Oral    SpO2: (!) 88% 94%  91%  Weight:   (!) 204.1 kg (!) 235 kg  Height:   5\' 10"  (1.778 m)     Constitutional: NAD, calm  Eyes: PERTLA, lids and conjunctivae normal ENMT: Mucous membranes are moist. Posterior pharynx clear of any exudate or lesions.   Neck: normal, supple, no masses, no thyromegaly Respiratory: Fine rales bilaterally, no wheezing. Mild dyspnea with speech, mild tachypnea. No accessory muscle use.  Cardiovascular: Rate ~120 and regular. Pitting edema to bilateral LE's, L>R. Abdomen: No distension, no tenderness, soft. Bowel sounds active.  Musculoskeletal: no clubbing / cyanosis. No joint deformity upper and lower extremities.    Skin: Left leg is hot, erythematous, tender, and edematous without drainage or fluctuance; there is a thick callous about the left foot with fissure at lateral aspect. Warm, dry, well-perfused. Neurologic: No facial asymmetry. Sensation intact. Moving all extremities.  Psychiatric: Alert and oriented x 3. Calm, cooperative.    Labs on Admission: I have personally reviewed following labs and imaging studies  CBC: Recent Labs  Lab 07/15/18 0341  WBC 29.7*  NEUTROABS 27.4*  HGB 14.9  HCT 49.2  MCV 86.9  PLT 166   Basic Metabolic Panel: Recent Labs  Lab 07/15/18 0341  NA 135  K 3.4*  CL 98  CO2 27  GLUCOSE 119*  BUN 13  CREATININE 0.84  CALCIUM 8.3*   GFR: Estimated Creatinine Clearance: 232.4 mL/min (by C-G formula based on SCr of 0.84 mg/dL). Liver Function Tests: Recent Labs  Lab 07/15/18 0341  AST 23  ALT 19  ALKPHOS 74  BILITOT 1.3*  PROT  6.5  ALBUMIN 3.0*   No results for input(s): LIPASE, AMYLASE in the last 168 hours. No results for input(s): AMMONIA in the last 168 hours. Coagulation Profile: No results for input(s): INR, PROTIME in the last 168 hours. Cardiac Enzymes: No results for input(s): CKTOTAL, CKMB, CKMBINDEX, TROPONINI in the last 168 hours. BNP (last 3 results) No results for input(s): PROBNP in the last 8760 hours. HbA1C: No results for input(s): HGBA1C in the last 72 hours. CBG: No results for input(s): GLUCAP in the last 168 hours. Lipid Profile: No results for input(s): CHOL, HDL, LDLCALC, TRIG, CHOLHDL, LDLDIRECT in the last 72 hours. Thyroid Function Tests: No results for input(s): TSH, T4TOTAL, FREET4, T3FREE, THYROIDAB in the last 72 hours. Anemia Panel: No results for input(s): VITAMINB12, FOLATE, FERRITIN, TIBC, IRON, RETICCTPCT in the last 72 hours. Urine analysis:    Component Value Date/Time  COLORURINE YELLOW 01/23/2018 0009   APPEARANCEUR CLEAR 01/23/2018 0009   LABSPEC 1.020 01/23/2018 0009   PHURINE 5.0 01/23/2018 0009   GLUCOSEU NEGATIVE 01/23/2018 0009   HGBUR MODERATE (A) 01/23/2018 0009   BILIRUBINUR NEGATIVE 01/23/2018 0009   KETONESUR NEGATIVE 01/23/2018 0009   PROTEINUR 100 (A) 01/23/2018 0009   UROBILINOGEN 2.0 (H) 06/18/2011 1055   NITRITE NEGATIVE 01/23/2018 0009   LEUKOCYTESUR NEGATIVE 01/23/2018 0009   Sepsis Labs: @LABRCNTIP (procalcitonin:4,lacticidven:4) )No results found for this or any previous visit (from the past 240 hour(s)).   Radiological Exams on Admission: Dg Chest 2 View  Result Date: 07/15/2018 CLINICAL DATA:  Acute onset of left lower leg pain and erythema. EXAM: CHEST - 2 VIEW COMPARISON:  Chest radiograph performed 01/23/2018 FINDINGS: The lungs are well-aerated. Vascular congestion is noted. Increased interstitial markings may reflect mild interstitial edema. There is no evidence of pleural effusion or pneumothorax. The heart is borderline normal  in size. No acute osseous abnormalities are seen. IMPRESSION: Vascular congestion. Increased interstitial markings may reflect mild interstitial edema. Electronically Signed   By: Roanna Raider M.D.   On: 07/15/2018 03:34    EKG: Not performed.   Assessment/Plan   1. Sepsis secondary to left lower leg cellulitis   - Patient has recurrent cellulitis, may have underlying lymphedema, and now presents with acute-onset of rigors with pain, swelling, redness, and heat involving the lower left leg  - There is no drainage and no fluctuance appreciated  - Thick callus with deep fissure may be the entry point  - Culture blood, trend lactate, start with broad-spectrum coverage given sepsis with underlying lymphedema and recent antibiotic use, check [place] for underlying fluid collection, and venous US to r/o DVT    2. Acute on chronic diastolic CHF  - Fluid status difficult to determine clinically d/t body habitus and documented weights do not appear to be accurate, but he is dyspneic with rales  - He was appropriately given a 1 liter NS bolus in ED in setting of sepsis with tachycardia in 120's and may need additional IVF before sepsis resolves, but may need to be diuresed once the infection is improving and sepsis physiology resolved   - SLIV, hold Lasix for now, follow daily wt and I/O's    3. Hypokalemia  - Mild, replaced   4. Hypertension  - BP has been stable so far in ED, but he is septic and lisinopril will be held initially     DVT prophylaxis: SCD's  Code Status: Full  Family Communication: Significant other updated at bedside Consults called: None Admission status: Observation     Briscoe Deutscher, MD [facility] Pager [phone]  If 7PM-7AM, please contact night-coverage [email or web address] Password [email or web address]  07/15/2018, 5:50 AM

## 2018-07-15 NOTE — ED Notes (Signed)
Placed patient on bariatric bed.

## 2018-07-15 NOTE — Progress Notes (Signed)
Called to room for low sats while pt. on CPAP with V/S rounds check, RT found pt. on CPAP on room air, ~82%, placed Oxygen line into CPAP circuit with humidity bottle from n/c use previously, family member @ bedside states, "pt. is to be seen about getting Oxygen @ home, sats >'d to 92% per current order of >92%, RN aware, RT to monitor.

## 2018-07-15 NOTE — Progress Notes (Signed)
PROGRESS NOTE                                                                                                                                                                                                             Patient Demographics:    Andre [name], is a 38 y.o. male, DOB - 22-Sep-1980, RDE:[id number]  Admit date - 07/15/2018   Admitting Physician Briscoe Deutscher, MD  Outpatient Primary MD for the patient is Annita Brod, MD  LOS - 0  Outpatient Specialists:  Chief Complaint  Patient presents with  . Leg Pain       Brief Narrative   38 year old morbidly obese male (BMI of 55), chronic diastolic CHF, OSA on CPAP, hypertension and recurrent cellulitis presented to the ED with acute onset of chills with left leg swelling, redness and warmth with tenderness since 1 day.  Also had associated chills with severe pain in the left calf on standing.  Patient has had recurrent cellulitis involving lower extremities, abdominal wall and scrotum, completed course of ciprofloxacin and clindamycin about 1 month ago for suspected scrotal cellulitis (resolved at present).  Denies any drainage from the left foot.  Denies any trauma or insect bite. In the ED he was septic with fever of 101.5 F, O2 sat in high 80s, tachypneic, tachycardic to 120s with stable blood pressure.  Chest x-ray showed vascular congestion with increased interstitial markings.  WBC of 20 9.7K with increased bands, lactic acid elevated at 2.53.  Chemistry showed mild hypokalemia. Patient given 1 L IV normal saline bolus in the ED and IV vancomycin.  Placed on telemetry for further management.   Subjective:   Patient on CPAP.  Reports intermittent pain in his left leg.  Assessment  & Plan :    Principal Problem:   Sepsis due to cellulitis of left leg (HCC) Monitor on telemetry.  Subsequent lactic acid normalized.  However blood pressure still soft.  Given  bilateral rales with concern for pulmonary vascular congestion I will hold off on further fluid. Continue empiric IV vancomycin.,  Added Flagyl on admission.  Follow blood culture.  Pain control with PRN Vicodin. Lower extremity Doppler ordered to rule out DVT.   Active Problems: Acute on chronic diastolic CHF (HCC) Has extensive rales.  Patient is on 40 mg p.o. Lasix daily, I will place him  on IV 40 mg twice daily.  Monitor strict I's/O and daily weight. O2 via nasal cannula as needed.  I will also add PRN albuterol nebs.  Morbid obesity (BMI 74.33 kg/m). Patient reports he is ambulatory in and around the house.  Hypotension Hold off on blood pressure medications (lisinopril and amlodipine) due to soft blood pressure and sepsis.  Monitor on IV Lasix.  Obstructive sleep apnea Continue nighttime CPAP.    Hypokalemia Replenish   Patient meets criteria for inpatient given acute sepsis with tachycardia, tachypnea, significant leukocytosis and lactic acidosis along with acute on chronic diastolic CHF.  Patient needs broad-spectrum IV antibiotics and needs to be monitored for at least >2 midnights as inpatient.  Code Status : Full code  Family Communication  : Girlfriend at bedside  Disposition Plan  : Pending hospital course  Barriers For Discharge : Active symptoms  Consults  : None  Procedures  : Doppler lower extremity (pending)  DVT Prophylaxis  :  Lovenox -   Lab Results  Component Value Date   PLT 166 07/15/2018    Antibiotics  :  Anti-infectives (From admission, onward)   Start     Dose/Rate Route Frequency Ordered Stop   07/15/18 1400  vancomycin (VANCOCIN) 1,500 mg in sodium chloride 0.9 % 500 mL IVPB     1,500 mg 250 mL/hr over 120 Minutes Intravenous Every 8 hours 07/15/18 0633     07/15/18 0630  vancomycin (VANCOCIN) 1,500 mg in sodium chloride 0.9 % 500 mL IVPB     1,500 mg 250 mL/hr over 120 Minutes Intravenous  Once 07/15/18 0630 07/15/18 0902    07/15/18 0600  metroNIDAZOLE (FLAGYL) IVPB 500 mg     500 mg 100 mL/hr over 60 Minutes Intravenous Every 8 hours 07/15/18 0526     07/15/18 0530  ceFEPIme (MAXIPIME) 2 g in sodium chloride 0.9 % 100 mL IVPB     2 g 200 mL/hr over 30 Minutes Intravenous  Once 07/15/18 0526 07/15/18 0755   07/15/18 0500  vancomycin (VANCOCIN) IVPB 1000 mg/200 mL premix     1,000 mg 200 mL/hr over 60 Minutes Intravenous  Once 07/15/18 0447 07/15/18 0701        Objective:   Vitals:   07/15/18 0822 07/15/18 0845 07/15/18 0856 07/15/18 0900  BP: (!) 115/45  (!) 104/46 (!) 117/51  Pulse: (!) 111  (!) 110 (!) 107  Resp: 20  (!) 23 (!) 26  Temp:  99.6 F (37.6 C)    TempSrc:  Oral    SpO2: 90%  91% 90%  Weight:      Height:        Wt Readings from Last 3 Encounters:  07/15/18 (!) 235 kg  01/23/18 (!) 215.5 kg  01/13/18 (!) 186 kg     Intake/Output Summary (Last 24 hours) at 07/15/2018 0936 Last data filed at 07/15/2018 0839 Gross per 24 hour  Intake 1400 ml  Output -  Net 1400 ml     Physical Exam  Gen: Morbidly obese male on CPAP, fatigued, not in distress HEENT: no pallor, moist mucosa, supple neck Chest: Scattered rhonchi bilaterally CVS: N S1&S2, no murmurs, rubs or gallop GI: soft, obese, non-distended, nontender, chronic pannus Musculoskeletal: Swollen left lower extremity with erythema, warmth and tender to touch.  No discharge.     Data Review:    CBC Recent Labs  Lab 07/15/18 0341  WBC 29.7*  HGB 14.9  HCT 49.2  PLT 166  MCV 86.9  MCH 26.3  MCHC 30.3  RDW 14.8  LYMPHSABS 0.4*  MONOABS 1.2*  EOSABS 0.0  BASOSABS 0.1    Chemistries  Recent Labs  Lab 07/15/18 0341  NA 135  K 3.4*  CL 98  CO2 27  GLUCOSE 119*  BUN 13  CREATININE 0.84  CALCIUM 8.3*  AST 23  ALT 19  ALKPHOS 74  BILITOT 1.3*   ------------------------------------------------------------------------------------------------------------------ No results for input(s): CHOL, HDL, LDLCALC,  TRIG, CHOLHDL, LDLDIRECT in the last 72 hours.  Lab Results  Component Value Date   HGBA1C  10/07/2008    5.4 (NOTE)   The ADA recommends the following therapeutic goal for glycemic   control related to Hgb A1C measurement:   Goal of Therapy:   < 7.0% Hgb A1C   Reference: American Diabetes Association: Clinical Practice   Recommendations 2008, Diabetes Care,  2008, 31:(Suppl 1).   ------------------------------------------------------------------------------------------------------------------ No results for input(s): TSH, T4TOTAL, T3FREE, THYROIDAB in the last 72 hours.  Invalid input(s): FREET3 ------------------------------------------------------------------------------------------------------------------ No results for input(s): VITAMINB12, FOLATE, FERRITIN, TIBC, IRON, RETICCTPCT in the last 72 hours.  Coagulation profile No results for input(s): INR, PROTIME in the last 168 hours.  No results for input(s): DDIMER in the last 72 hours.  Cardiac Enzymes No results for input(s): CKMB, TROPONINI, MYOGLOBIN in the last 168 hours.  Invalid input(s): CK ------------------------------------------------------------------------------------------------------------------    Component Value Date/Time   BNP 19.7 12/08/2017 1647    Inpatient Medications  Scheduled Meds: . potassium chloride  20 mEq Oral BID  . sodium chloride flush  3 mL Intravenous Q12H  . sodium chloride flush  3 mL Intravenous Q12H   Continuous Infusions: . sodium chloride    . metronidazole Stopped (07/15/18 0755)  . vancomycin     PRN Meds:.sodium chloride, acetaminophen **OR** acetaminophen, HYDROcodone-acetaminophen, ketorolac, ondansetron **OR** ondansetron (ZOFRAN) IV, senna-docusate, sodium chloride flush  Micro Results No results found for this or any previous visit (from the past 240 hour(s)).  Radiology Reports Dg Chest 2 View  Result Date: 07/15/2018 CLINICAL DATA:  Acute onset of left lower  leg pain and erythema. EXAM: CHEST - 2 VIEW COMPARISON:  Chest radiograph performed 01/23/2018 FINDINGS: The lungs are well-aerated. Vascular congestion is noted. Increased interstitial markings may reflect mild interstitial edema. There is no evidence of pleural effusion or pneumothorax. The heart is borderline normal in size. No acute osseous abnormalities are seen. IMPRESSION: Vascular congestion. Increased interstitial markings may reflect mild interstitial edema. Electronically Signed   By: Roanna RaiderJeffery  Chang M.D.   On: 07/15/2018 03:34   [place] Lt Lower Extrem Ltd Soft Tissue Non Vascular  Result Date: 07/15/2018 CLINICAL DATA:  Acute onset of left lower leg swelling and erythema. Sepsis due to cellulitis. EXAM: ULTRASOUND LEFT LOWER EXTREMITY LIMITED TECHNIQUE: Ultrasound examination of the lower extremity soft tissues was performed in the area of clinical concern. COMPARISON:  None. FINDINGS: Joint Space: N/A Muscles: The musculature is not well characterized on this study. Tendons: N/A Other Soft Tissue Structures: There is mild diffuse soft tissue edema about the left lower leg, with overlying erythema. No fluid collection is seen to suggest abscess. No mass is identified. IMPRESSION: Mild diffuse soft tissue edema about the left lower leg, with overlying erythema. No evidence of abscess. Electronically Signed   By: Roanna RaiderJeffery  Chang M.D.   On: 07/15/2018 07:17    Time Spent in minutes  25   Donalda Job M.D on 07/15/2018 at 9:36 AM  Between 7am to 7pm - Pager - [phone]  After 7pm go to [email or web address] - password [facility]  [facility] -  Office  [phone]

## 2018-07-15 NOTE — ED Notes (Signed)
Bed: WA21 Expected date: 07/15/18 Expected time: 2:45 AM Means of arrival: Ambulance Comments: 43M leg infection

## 2018-07-15 NOTE — ED Notes (Addendum)
CRITICAL VALUE STICKER  CRITICAL VALUE: Lactic 2.53  RECEIVER (on-site recipient of call):   DATE & TIME NOTIFIED: 07/15/2018  MESSENGER (representative from lab): i[facility]  MD NOTIFIED: Delo  TIME OF NOTIFICATION: 436a  RESPONSE: see orders

## 2018-07-15 NOTE — Progress Notes (Signed)
Pharmacy Antibiotic Note  Andre [name] is a 38 y.o. male admitted on 07/15/2018 with cellulitis.  Pharmacy has been consulted for Vancomycin dosing.  Plan: Vancomycin 1gm iv x1, then 1.5gm iv for total 2.5gm load.  Then begin 1500mg  iv q8hr  Goal AUC = 400 - 500 for all indications, except meningitis (goal AUC > 500 and Cmin 15-20 mcg/mL)  Cefepime 2gm iv q8hr  Height: 5\' 10"  (177.8 cm) Weight: (!) 518 lb (235 kg) IBW/kg (Calculated) : 73  Temp (24hrs), Avg:101.5 F (38.6 C), Min:101.5 F (38.6 C), Max:101.5 F (38.6 C)  Recent Labs  Lab 07/15/18 0341 07/15/18 0351  WBC 29.7*  --   CREATININE 0.84  --   LATICACIDVEN  --  2.53*    Estimated Creatinine Clearance: 232.4 mL/min (by C-G formula based on SCr of 0.84 mg/dL).    Allergies  Allergen Reactions  . Other Hives    Steroid, but cannot remember name.    Antimicrobials this admission: Vancomycin 07/15/2018 >> Cefepime 07/15/2018 >>   Dose adjustments this admission: -  Microbiology results: -  Thank you for allowing pharmacy to be a part of this patient's care.  Aleene Davidson Crowford 07/15/2018 6:33 AM

## 2018-07-15 NOTE — ED Provider Notes (Signed)
[facility]-EMERGENCY DEPT Provider Note   CSN: [id number] Arrival date & time: 07/15/18  0245     History   Chief Complaint Chief Complaint  Patient presents with  . Leg Pain    HPI Andre [name] is a 38 y.o. male.  Patient is a 38 year old male with history of morbid obesity, obstructive sleep apnea, hypertension.  He presents today for evaluation of fever, weakness, and left lower leg swelling and redness.  This started abruptly this evening.  He denies any chest pain, abdominal pain, shortness of breath, or productive cough.  The history is provided by the patient.  Leg Pain   This is a new problem. The current episode started 3 to 5 hours ago. The problem occurs constantly. The problem has been rapidly worsening. The pain is present in the left lower leg. The pain is moderate. He has tried nothing for the symptoms.    Past Medical History:  Diagnosis Date  . Anemia    iron deficient  . Cellulitis    lower extremities  . Cellulitis of scrotum    History of  . CHF (congestive heart failure) (HCC)   . History of acute bronchitis with bronchospasm   . Hypertension   . Morbid obesity (HCC)   . Obesity hypoventilation syndrome (HCC)   . Obstructive sleep apnea   . Panniculitis    History of  . Sleep apnea     Patient Active Problem List   Diagnosis Date Noted  . Cellulitis of scrotum 01/23/2018  . Cellulitis of abdominal wall 01/23/2018  . Leukocytosis   . Cellulitis of left leg 01/13/2015  . Cellulitis 01/13/2015  . TOBACCO ABUSE 03/23/2009  . EDEMA 03/23/2009  . Morbid obesity (HCC) 11/12/2008  . OBESITY HYPOVENTILATION SYNDROME 11/12/2008  . Obstructive sleep apnea 11/12/2008  . Essential hypertension 11/12/2008  . CELLULITIS, LEG, LEFT 11/12/2008    History reviewed. No pertinent surgical history.      Home Medications    Prior to Admission medications   Medication Sig Start Date End Date Taking? Authorizing Provider    furosemide (LASIX) 40 MG tablet Take 1 tablet (40 mg total) by mouth daily. 06/23/18  Yes Antony Madura, PA-C  ibuprofen (ADVIL,MOTRIN) 200 MG tablet Take 600 mg by mouth every 6 (six) hours as needed for moderate pain.   Yes [provider]  lisinopril (PRINIVIL,ZESTRIL) 5 MG tablet Take 1 tablet (5 mg total) by mouth daily. 06/23/18  Yes Antony Madura, PA-C  albuterol (PROVENTIL HFA;VENTOLIN HFA) 108 (90 Base) MCG/ACT inhaler Inhale 2 puffs into the lungs every 6 (six) hours as needed for wheezing or shortness of breath. Patient not taking: Reported on 06/13/2018 01/25/18   Briant Cedar, MD  amLODipine (NORVASC) 10 MG tablet Take 1 tablet (10 mg total) by mouth daily. Patient not taking: Reported on 06/22/2018 01/25/18 02/24/18  Briant Cedar, MD  ciprofloxacin (CIPRO) 500 MG tablet Take 1 tablet (500 mg total) by mouth 2 (two) times daily. Patient not taking: Reported on 06/22/2018 06/13/18   Bethel Born, PA-C  clindamycin (CLEOCIN) 150 MG capsule Take 3 capsules (450 mg total) by mouth every 6 (six) hours. Patient not taking: Reported on 06/22/2018 06/13/18   Bethel Born, PA-C  miconazole nitrate (MICATIN) POWD Apply 1 application topically 2 (two) times daily. Use for 1 week. Patient not taking: Reported on 06/13/2018 01/25/18   Briant Cedar, MD  nicotine (NICODERM CQ - DOSED IN MG/24 HOURS) 21 mg/24hr patch  Place 1 patch (21 mg total) onto the skin daily. Patient not taking: Reported on 06/13/2018 01/26/18   Briant CedarEzenduka, Nkeiruka J, MD    Family History History reviewed. No pertinent family history.  Social History Social History   Tobacco Use  . Smoking status: Current Every Day Smoker    Packs/day: 1.00    Years: 9.00    Pack years: 9.00  . Smokeless tobacco: Never Used  Substance Use Topics  . Alcohol use: Yes  . Drug use: No     Allergies   Other   Review of Systems Review of Systems  All other systems reviewed and are  negative.    Physical Exam Updated Vital Signs BP (!) 130/53 (BP Location: Right Arm)   Pulse (!) 114   Temp (!) 101.5 F (38.6 C) (Oral)   Resp (!) 24   Ht 5\' 10"  (1.778 m)   Wt (!) 204.1 kg   SpO2 94%   BMI 64.57 kg/m   Physical Exam Vitals signs and nursing note reviewed.  Constitutional:      General: He is not in acute distress.    Appearance: He is well-developed. He is not diaphoretic.  HENT:     Head: Normocephalic and atraumatic.  Neck:     Musculoskeletal: Normal range of motion and neck supple.  Cardiovascular:     Rate and Rhythm: Normal rate and regular rhythm.     Heart sounds: No murmur. No friction rub.  Pulmonary:     Effort: Pulmonary effort is normal. No respiratory distress.     Breath sounds: Normal breath sounds. No wheezing or rales.  Abdominal:     General: Bowel sounds are normal. There is no distension.     Palpations: Abdomen is soft.     Tenderness: There is no abdominal tenderness.  Musculoskeletal: Normal range of motion.     Comments: The left lower extremity has the appearance of venous stasis.  There is warmth to the touch and erythema in a stocking distribution.  DP pulses are palpable bilaterally.  Skin:    General: Skin is warm and dry.  Neurological:     Mental Status: He is alert and oriented to person, place, and time.     Coordination: Coordination normal.      ED Treatments / Results  Labs (all labs ordered are listed, but only abnormal results are displayed) Labs Reviewed  COMPREHENSIVE METABOLIC PANEL - Abnormal; Notable for the following components:      Result Value   Potassium 3.4 (*)    Glucose, Bld 119 (*)    Calcium 8.3 (*)    Albumin 3.0 (*)    Total Bilirubin 1.3 (*)    All other components within normal limits  I-STAT CG4 LACTIC ACID, ED - Abnormal; Notable for the following components:   Lactic Acid, Venous 2.53 (*)    All other components within normal limits  CBC WITH DIFFERENTIAL/PLATELET   URINALYSIS, ROUTINE W REFLEX MICROSCOPIC  I-STAT CG4 LACTIC ACID, ED    EKG None  Radiology Dg Chest 2 View  Result Date: 07/15/2018 CLINICAL DATA:  Acute onset of left lower leg pain and erythema. EXAM: CHEST - 2 VIEW COMPARISON:  Chest radiograph performed 01/23/2018 FINDINGS: The lungs are well-aerated. Vascular congestion is noted. Increased interstitial markings may reflect mild interstitial edema. There is no evidence of pleural effusion or pneumothorax. The heart is borderline normal in size. No acute osseous abnormalities are seen. IMPRESSION: Vascular congestion. Increased interstitial markings may  reflect mild interstitial edema. Electronically Signed   By: Roanna Raider M.D.   On: 07/15/2018 03:34    Procedures Procedures (including critical care time)  Medications Ordered in ED Medications  vancomycin (VANCOCIN) IVPB 1000 mg/200 mL premix (has no administration in time range)  sodium chloride 0.9 % bolus 1,000 mL (has no administration in time range)     Initial Impression / Assessment and Plan / ED Course  I have reviewed the triage vital signs and the nursing notes.  Pertinent labs & imaging results that were available during my care of the patient were reviewed by me and considered in my medical decision making (see chart for details).  Patient with history of obesity, lymphedema, and CHF.  He presents with weakness, fever, and left leg pain, redness, and swelling.  He arrived here febrile with white count of nearly 30,000.  His left lower leg is erythematous and warm to the touch.  He has had cellulitis in the past and I believe this to be a recurrence.  Blood cultures were obtained and the patient was started on vancomycin.  He will be admitted to the hospitalist service under the care of Dr. Antionette Char.  Final Clinical Impressions(s) / ED Diagnoses   Final diagnoses:  None    ED Discharge Orders    None       Geoffery Lyons, MD 07/15/18 [phone]

## 2018-07-15 NOTE — Progress Notes (Signed)
Left lower extremity venous duplex exam completed. Please see preliminary notes on CV PROC under chart review. Andre [name] Andre [name](RDMS RVT) 07/15/18 12:10 PM

## 2018-07-15 NOTE — ED Notes (Signed)
Patient has been put back on Bi-Pap after eating Breakfast.

## 2018-07-15 NOTE — ED Triage Notes (Signed)
Patient brought in by [facility] from home. Patient complaining of left lower leg pain that started early today. Left lower leg is warm to touch and reddened. Patient was treated for scrotal swelling three weeks ago.

## 2018-07-15 NOTE — ED Notes (Signed)
WILL TRANSPORT PT TO 5E 1515-1. AAOX4. PT IN NO APPARENT DISTRESS OR PAIN. IVF INFUSING W/O PAIN OR SWELLING. FAMILY AT THE BEDSIDE. THE OPPORTUNITY TO ASK QUESTIONS WAS PROVIDED.

## 2018-07-16 DIAGNOSIS — G4733 Obstructive sleep apnea (adult) (pediatric): Secondary | ICD-10-CM

## 2018-07-16 DIAGNOSIS — J9601 Acute respiratory failure with hypoxia: Secondary | ICD-10-CM

## 2018-07-16 LAB — BASIC METABOLIC PANEL
Anion gap: 10 (ref 5–15)
BUN: 14 mg/dL (ref 6–20)
CO2: 28 mmol/L (ref 22–32)
Calcium: 8 mg/dL — ABNORMAL LOW (ref 8.9–10.3)
Chloride: 99 mmol/L (ref 98–111)
Creatinine, Ser: 0.79 mg/dL (ref 0.61–1.24)
GFR calc Af Amer: >60 mL/min (ref >60)
GFR calc non Af Amer: >60 mL/min (ref >60)
Glucose, Bld: 94 mg/dL (ref 70–99)
Potassium: 4.4 mmol/L (ref 3.5–5.1)
Sodium: 137 mmol/L (ref 135–145)

## 2018-07-16 LAB — CBC WITH DIFFERENTIAL/PLATELET
Abs Immature Granulocytes: 0.48 10*3/uL — ABNORMAL HIGH (ref 0.00–0.07)
Basophils Absolute: 0.1 10*3/uL (ref 0.0–0.1)
Basophils Relative: 0 %
Eosinophils Absolute: 0 10*3/uL (ref 0.0–0.5)
Eosinophils Relative: 0 %
HCT: 49.2 % (ref 39.0–52.0)
Hemoglobin: 13.9 g/dL (ref 13.0–17.0)
IMMATURE GRANULOCYTES: 2 %
LYMPHS PCT: 2 %
Lymphs Abs: 0.5 10*3/uL — ABNORMAL LOW (ref 0.7–4.0)
MCH: 26.5 pg (ref 26.0–34.0)
MCHC: 28.3 g/dL — ABNORMAL LOW (ref 30.0–36.0)
MCV: 93.9 fL (ref 80.0–100.0)
Monocytes Absolute: 2 10*3/uL — ABNORMAL HIGH (ref 0.1–1.0)
Monocytes Relative: 7 %
NEUTROS ABS: 23.6 10*3/uL — AB (ref 1.7–7.7)
Neutrophils Relative %: 89 %
Platelets: 118 10*3/uL — ABNORMAL LOW (ref 150–400)
RBC: 5.24 MIL/uL (ref 4.22–5.81)
RDW: 15.3 % (ref 11.5–15.5)
WBC: 26.7 10*3/uL — ABNORMAL HIGH (ref 4.0–10.5)
nRBC: 0 % (ref 0.0–0.2)

## 2018-07-16 LAB — GLUCOSE, CAPILLARY: Glucose-Capillary: 104 mg/dL — ABNORMAL HIGH (ref 70–99)

## 2018-07-16 MED ORDER — LEVALBUTEROL HCL 0.63 MG/3ML IN NEBU
0.6300 mg | INHALATION_SOLUTION | Freq: Four times a day (QID) | RESPIRATORY_TRACT | Status: DC | PRN
  Administered 2018-07-16: 0.63 mg via RESPIRATORY_TRACT
  Filled 2018-07-16 (×2): qty 3

## 2018-07-16 MED ORDER — CEFAZOLIN SODIUM-DEXTROSE 2-4 GM/100ML-% IV SOLN
2.0000 g | Freq: Three times a day (TID) | INTRAVENOUS | Status: DC
  Administered 2018-07-16 – 2018-07-20 (×10): 2 g via INTRAVENOUS
  Filled 2018-07-16 (×13): qty 100

## 2018-07-16 NOTE — Progress Notes (Addendum)
PROGRESS NOTE                                                                                                                                                                                                             Patient Demographics:    Andre [name], is a 38 y.o. male, DOB - 1980/10/19, ZOX:[id number]  Admit date - 07/15/2018   Admitting Physician Briscoe Deutscher, MD  Outpatient Primary MD for the patient is Annita Brod, MD  LOS - 1  Outpatient Specialists:  Chief Complaint  Patient presents with  . Leg Pain       Brief Narrative   38 year old morbidly obese male (BMI of 59), chronic diastolic CHF, OSA on CPAP, hypertension and recurrent cellulitis presented to the ED with acute onset of chills with left leg swelling, redness and warmth with tenderness since 1 day.  Also had associated chills with severe pain in the left calf on standing.  Patient has had recurrent cellulitis involving lower extremities, abdominal wall and scrotum, completed course of ciprofloxacin and clindamycin about 1 month ago for suspected scrotal cellulitis (resolved at present).  Denies any drainage from the left foot.  Denies any trauma or insect bite. In the ED he was septic with fever of 101.5 F, O2 sat in high 80s, tachypneic, tachycardic to 120s with stable blood pressure.  Chest x-ray showed vascular congestion with increased interstitial markings.  WBC of 20 9.7K with increased bands, lactic acid elevated at 2.53.  Chemistry showed mild hypokalemia. Patient given 1 L IV normal saline bolus in the ED and IV vancomycin.  Placed on telemetry for further management.   Subjective:   Remains afebrile since yesterday morning.  Noted for drop in O2 sat to the 80s last night, placed on CPAP with oxygen and currently stable.  Reports his breathing and leg pain better since presentation.  Assessment  & Plan :    Principal Problem:   Sepsis  due to cellulitis of left leg (HCC) Stable on telemetry except for heart rate in low 100s.  Sepsis resolving, WBC trending down.  Lactate normalized. Continue empiric IV vancomycin, cefepime and Flagyl.  Blood culture negative to date.  Pain control with PRN Vicodin. Lower extremity Doppler Limited study without definitive DVT noted.   Active Problems: Acute respiratory failure with hypoxia impaired HCC) Acute on  chronic diastolic CHF (HCC) Continue IV Lasix every 12 hours.  Of fluids. O2 via nasal cannula as needed and PRN albuterol nebs.  Morbid obesity (BMI 74.33 kg/m). Patient reports he is ambulatory in and around the house.  Hypotension Secondary to sepsis.  Lisinopril and amlodipine held.  Continue IV Lasix.  Obstructive sleep apnea Continue nighttime CPAP.    Hypokalemia Replenished.    Code Status : Full code  Family Communication  : None at bedside  Disposition Plan  : Pending clinical improvement  Barriers For Discharge : Active symptoms  Consults  : None  Procedures  : Doppler lower extremity   DVT Prophylaxis  :  Lovenox -   Lab Results  Component Value Date   PLT 118 (L) 07/16/2018    Antibiotics  :  Anti-infectives (From admission, onward)   Start     Dose/Rate Route Frequency Ordered Stop   07/15/18 1600  ceFEPIme (MAXIPIME) 2 g in sodium chloride 0.9 % 100 mL IVPB     2 g 200 mL/hr over 30 Minutes Intravenous Every 8 hours 07/15/18 1330     07/15/18 1400  vancomycin (VANCOCIN) 1,500 mg in sodium chloride 0.9 % 500 mL IVPB     1,500 mg 250 mL/hr over 120 Minutes Intravenous Every 8 hours 07/15/18 0633     07/15/18 0630  vancomycin (VANCOCIN) 1,500 mg in sodium chloride 0.9 % 500 mL IVPB     1,500 mg 250 mL/hr over 120 Minutes Intravenous  Once 07/15/18 0630 07/15/18 0947   07/15/18 0600  metroNIDAZOLE (FLAGYL) IVPB 500 mg     500 mg 100 mL/hr over 60 Minutes Intravenous Every 8 hours 07/15/18 0526     07/15/18 0530  ceFEPIme (MAXIPIME) 2 g  in sodium chloride 0.9 % 100 mL IVPB     2 g 200 mL/hr over 30 Minutes Intravenous  Once 07/15/18 0526 07/15/18 0755   07/15/18 0500  vancomycin (VANCOCIN) IVPB 1000 mg/200 mL premix     1,000 mg 200 mL/hr over 60 Minutes Intravenous  Once 07/15/18 0447 07/15/18 0701        Objective:   Vitals:   07/16/18 0559 07/16/18 0900 07/16/18 1030 07/16/18 1033  BP: (!) 106/56 (!) 120/58 136/79   Pulse: (!) 108 (!) 109 (!) 106 (!) 106  Resp: (!) 24 (!) 24 16 16   Temp: 98.6 F (37 C)  98.2 F (36.8 C)   TempSrc: Axillary     SpO2: 95% 94% (!) 77% (!) 87%  Weight:      Height:        Wt Readings from Last 3 Encounters:  07/16/18 (!) 235 kg  01/23/18 (!) 215.5 kg  01/13/18 (!) 186 kg     Intake/Output Summary (Last 24 hours) at 07/16/2018 1057 Last data filed at 07/16/2018 0543 Gross per 24 hour  Intake 950.63 ml  Output 1280 ml  Net -329.37 ml    Physical exam Morbidly obese male not in distress HEENT: Moist mucosa, supple neck Chest: Scattered rhonchi bilaterally CVS: Normal S1 and S2, no murmurs GI: Soft, nondistended, nontender, chronic pannus Musculoskeletal: Swollen left lower leg with erythema with warmth (improved from yesterday), minimal tenderness, no discharge Trace pitting edema over the right leg     Data Review:    CBC Recent Labs  Lab 07/15/18 0341 07/16/18 0607  WBC 29.7* 26.7*  HGB 14.9 13.9  HCT 49.2 49.2  PLT 166 118*  MCV 86.9 93.9  MCH 26.3 26.5  MCHC 30.3  28.3*  RDW 14.8 15.3  LYMPHSABS 0.4* 0.5*  MONOABS 1.2* 2.0*  EOSABS 0.0 0.0  BASOSABS 0.1 0.1    Chemistries  Recent Labs  Lab 07/15/18 0341 07/16/18 0607  NA 135 137  K 3.4* 4.4  CL 98 99  CO2 27 28  GLUCOSE 119* 94  BUN 13 14  CREATININE 0.84 0.79  CALCIUM 8.3* 8.0*  AST 23  --   ALT 19  --   ALKPHOS 74  --   BILITOT 1.3*  --    ------------------------------------------------------------------------------------------------------------------ No results for input(s):  CHOL, HDL, LDLCALC, TRIG, CHOLHDL, LDLDIRECT in the last 72 hours.  Lab Results  Component Value Date   HGBA1C  10/07/2008    5.4 (NOTE)   The ADA recommends the following therapeutic goal for glycemic   control related to Hgb A1C measurement:   Goal of Therapy:   < 7.0% Hgb A1C   Reference: American Diabetes Association: Clinical Practice   Recommendations 2008, Diabetes Care,  2008, 31:(Suppl 1).   ------------------------------------------------------------------------------------------------------------------ No results for input(s): TSH, T4TOTAL, T3FREE, THYROIDAB in the last 72 hours.  Invalid input(s): FREET3 ------------------------------------------------------------------------------------------------------------------ No results for input(s): VITAMINB12, FOLATE, FERRITIN, TIBC, IRON, RETICCTPCT in the last 72 hours.  Coagulation profile No results for input(s): INR, PROTIME in the last 168 hours.  No results for input(s): DDIMER in the last 72 hours.  Cardiac Enzymes No results for input(s): CKMB, TROPONINI, MYOGLOBIN in the last 168 hours.  Invalid input(s): CK ------------------------------------------------------------------------------------------------------------------    Component Value Date/Time   BNP 19.7 12/08/2017 1647    Inpatient Medications  Scheduled Meds: . furosemide  40 mg Intravenous Q12H  . sodium chloride flush  3 mL Intravenous Q12H  . sodium chloride flush  3 mL Intravenous Q12H   Continuous Infusions: . sodium chloride    . ceFEPime (MAXIPIME) IV 2 g (07/16/18 1003)  . metronidazole 500 mg (07/16/18 0536)  . vancomycin 1,500 mg (07/16/18 0713)   PRN Meds:.sodium chloride, acetaminophen **OR** acetaminophen, HYDROcodone-acetaminophen, ketorolac, levalbuterol, ondansetron **OR** ondansetron (ZOFRAN) IV, senna-docusate, sodium chloride flush  Micro Results No results found for this or any previous visit (from the past 240  hour(s)).  Radiology Reports Dg Chest 2 View  Result Date: 07/15/2018 CLINICAL DATA:  Acute onset of left lower leg pain and erythema. EXAM: CHEST - 2 VIEW COMPARISON:  Chest radiograph performed 01/23/2018 FINDINGS: The lungs are well-aerated. Vascular congestion is noted. Increased interstitial markings may reflect mild interstitial edema. There is no evidence of pleural effusion or pneumothorax. The heart is borderline normal in size. No acute osseous abnormalities are seen. IMPRESSION: Vascular congestion. Increased interstitial markings may reflect mild interstitial edema. Electronically Signed   By: Roanna RaiderJeffery  Chang M.D.   On: 07/15/2018 03:34   [place] Lt Lower Extrem Ltd Soft Tissue Non Vascular  Result Date: 07/15/2018 CLINICAL DATA:  Acute onset of left lower leg swelling and erythema. Sepsis due to cellulitis. EXAM: ULTRASOUND LEFT LOWER EXTREMITY LIMITED TECHNIQUE: Ultrasound examination of the lower extremity soft tissues was performed in the area of clinical concern. COMPARISON:  None. FINDINGS: Joint Space: N/A Muscles: The musculature is not well characterized on this study. Tendons: N/A Other Soft Tissue Structures: There is mild diffuse soft tissue edema about the left lower leg, with overlying erythema. No fluid collection is seen to suggest abscess. No mass is identified. IMPRESSION: Mild diffuse soft tissue edema about the left lower leg, with overlying erythema. No evidence of abscess. Electronically Signed   By: Roanna RaiderJeffery  Chang  M.D.   On: 07/15/2018 07:17   Vas [place] Lower Extremity Venous (dvt)  Result Date: 07/15/2018  Lower Venous Study Indications: Pain, Swelling, Edema, and Erythema.  Risk Factors: Obesity. Limitations: Body habitus and severe edema and pain. Comparison Study: Left lower extremity venous duplex exam on 12/09/2017 Performing Technologist: Melodie Bouillon  Examination Guidelines: A complete evaluation includes B-mode imaging, spectral Doppler, color Doppler, and power Doppler as  needed of all accessible portions of each vessel. Bilateral testing is considered an integral part of a complete examination. Limited examinations for reoccurring indications may be performed as noted.  Right Venous Findings: +---+---------------+---------+-----------+----------+-------------------------+    CompressibilityPhasicitySpontaneityPropertiesSummary                   +---+---------------+---------+-----------+----------+-------------------------+ CFV                                             Patent in doppler. Unable                                                 to well visualized with                                                   compression.              +---+---------------+---------+-----------+----------+-------------------------+  Left Venous Findings: +---------+---------------+---------+-----------+----------+-------------------+          CompressibilityPhasicitySpontaneityPropertiesSummary             +---------+---------------+---------+-----------+----------+-------------------+ CFV                                                   Patent in doppler.                                                        Unable to well                                                            visualized with                                                           compression.        +---------+---------------+---------+-----------+----------+-------------------+ SFJ  Patent in doppler.                                                        Unable to well                                                            visualized with                                                           compression.        +---------+---------------+---------+-----------+----------+-------------------+ FV Prox                                               Patent in doppler.                                                         Unable to well                                                            visualized with                                                           compression.        +---------+---------------+---------+-----------+----------+-------------------+ FV Mid                                                Patent in doppler.                                                        Unable to well                                                            visualized with  compression.        +---------+---------------+---------+-----------+----------+-------------------+ FV Distal                                             Not visualized      +---------+---------------+---------+-----------+----------+-------------------+ PFV                                                   Not visualized      +---------+---------------+---------+-----------+----------+-------------------+ POP                                                   Patent in doppler.                                                        Unable to well                                                            visualized with                                                           compression.        +---------+---------------+---------+-----------+----------+-------------------+ PTV                                                   Not visualized      +---------+---------------+---------+-----------+----------+-------------------+ PERO                                                  Not visualized      +---------+---------------+---------+-----------+----------+-------------------+ GSV      Full                    Yes                  vessle wall                                                               thickened            +---------+---------------+---------+-----------+----------+-------------------+  Summary: Left: Technically difficult due to body habitus, study cannot make conclusion due to limitation of vessel visibility.  *See table(s) above for measurements and observations.    Preliminary     Time Spent in minutes  35   Malley Hauter M.D on 07/16/2018 at 10:57 AM  Between 7am to 7pm - Pager - [phone]  After 7pm go to [email or web address] - password [facility]  [facility] -  Office  [phone]

## 2018-07-17 DIAGNOSIS — Z6841 Body Mass Index (BMI) 40.0 and over, adult: Secondary | ICD-10-CM

## 2018-07-17 DIAGNOSIS — J9621 Acute and chronic respiratory failure with hypoxia: Secondary | ICD-10-CM

## 2018-07-17 LAB — CBC
HCT: 48.2 % (ref 39.0–52.0)
Hemoglobin: 13.6 g/dL (ref 13.0–17.0)
MCH: 26.1 pg (ref 26.0–34.0)
MCHC: 28.2 g/dL — ABNORMAL LOW (ref 30.0–36.0)
MCV: 92.3 fL (ref 80.0–100.0)
Platelets: 107 10*3/uL — ABNORMAL LOW (ref 150–400)
RBC: 5.22 MIL/uL (ref 4.22–5.81)
RDW: 15.1 % (ref 11.5–15.5)
WBC: 16 10*3/uL — ABNORMAL HIGH (ref 4.0–10.5)
nRBC: 0 % (ref 0.0–0.2)

## 2018-07-17 LAB — BASIC METABOLIC PANEL
Anion gap: 9 (ref 5–15)
BUN: 17 mg/dL (ref 6–20)
CALCIUM: 8.2 mg/dL — AB (ref 8.9–10.3)
CO2: 33 mmol/L — ABNORMAL HIGH (ref 22–32)
CREATININE: 0.7 mg/dL (ref 0.61–1.24)
Chloride: 92 mmol/L — ABNORMAL LOW (ref 98–111)
GFR calc Af Amer: >60 mL/min (ref >60)
GFR calc non Af Amer: >60 mL/min (ref >60)
Glucose, Bld: 121 mg/dL — ABNORMAL HIGH (ref 70–99)
Potassium: 4.3 mmol/L (ref 3.5–5.1)
SODIUM: 134 mmol/L — AB (ref 135–145)

## 2018-07-17 LAB — GLUCOSE, CAPILLARY: Glucose-Capillary: 125 mg/dL — ABNORMAL HIGH (ref 70–99)

## 2018-07-17 LAB — MRSA PCR SCREENING: MRSA BY PCR: NEGATIVE

## 2018-07-17 MED ORDER — SENNOSIDES-DOCUSATE SODIUM 8.6-50 MG PO TABS
1.0000 | ORAL_TABLET | Freq: Two times a day (BID) | ORAL | Status: DC
  Administered 2018-07-17 – 2018-07-20 (×7): 1 via ORAL
  Filled 2018-07-17 (×7): qty 1

## 2018-07-17 NOTE — Progress Notes (Signed)
Pt c/o increased left lower leg pain , moaning with pain 8/10 - given tylenol & toradol. O2 sats room air 78-83% - when he uses CPAP mask O2 sats rise to 90%. Pt is eating a ft long subway sub

## 2018-07-17 NOTE — Progress Notes (Signed)
Pt refused to get OOB to chair during dinner meal.  C/O pain in that left leg.  Family at bedside.  Will try in am per pt.  Left leg redness marked and Inter-dry applied per MD request.

## 2018-07-17 NOTE — Care Management Important Message (Signed)
Important Message  Patient Details  Name: JAYMIEN KILMON MRN: [id number] Date of Birth: [birth year]   Medicare Important Message Given:  Yes    Caren Macadam 07/17/2018, 10:53 AMImportant Message  Patient Details  Name: NEMO SUDBURY MRN: [id number] Date of Birth: 12-01-1980   Medicare Important Message Given:  Yes    Caren Macadam 07/17/2018, 10:53 AM

## 2018-07-17 NOTE — Progress Notes (Signed)
PROGRESS NOTE  Andre [name]:[id number] DOB: [birth year] DOA: 07/15/2018 PCP: Annita Brod, MD  HPI/Recap of past 24 hours:  Fever resolved, last fever on 1/1 1am, less  Tachycardia, leukocytosis is improving  Report less leg pain  Some wheezing and cough, denies chest pain Reports no pcp until 3 weeks ago when he started to be swollen, he is started on lasix  Assessment/Plan: Principal Problem:   Sepsis due to cellulitis [facility]) Active Problems:   Obstructive sleep apnea   Essential hypertension   Acute on chronic diastolic CHF (congestive heart failure) (HCC)   Hypokalemia  Sepsis  With extensive cellulitis, left lower extremity -entire left lower leg, circumferential erythema, erythema extending up to right medial thigh -with fever, leukocytosis, lactic acidosis on presentation -Lower extremity Doppler Limited study without definitive DVT noted. Blood culture no growth -was on IV vancomycin, cefepime and Flagyl, improving, abx changed to ancef on 1/2 -if leg erythema persist, may need to repeat US to rule formin abscess  Thrombocytopenia: Mild, plt 118- 107 monitor  Acute on chronic diastolic chf/acute hypoxic respiratory failure, - o2 83% on room air, denies feeling sob, reports chronic low o2, denies chest pain -+ edema, + wheezing, dry cough, cxr with interstitial edema -continue iv lasix 40mg  bid, monitor bp, cr, continue 02 supplement, wean as tolerated  HTN:  Presents with sepsis, home bp meds held since admission On iv lasix for now  Smoker: smoking cessation education provided, declined nicotine patch  Morbid obesity/OSA on cpap Body mass index is 74.34 kg/m.  Code Status: full  Family Communication: patient   Disposition Plan: home in 1-2 days, need continue diuresis, needs abx, need to wean o2   Consultants:  none  Procedures:  none  Antibiotics:  As above   Objective: BP 112/67 (BP Location: Left Arm)   Pulse 100   Temp  98.1 F (36.7 C) (Oral)   Resp (!) 22   Ht 5\' 10"  (1.778 m)   Wt (!) 235 kg   SpO2 95%   BMI 74.34 kg/m   Intake/Output Summary (Last 24 hours) at 07/17/2018 1457 Last data filed at 07/17/2018 1258 Gross per 24 hour  Intake 240 ml  Output 3375 ml  Net -3135 ml   Filed Weights   07/15/18 0301 07/15/18 0448 07/16/18 0500  Weight: (!) 204.1 kg (!) 235 kg (!) 235 kg    Exam: Patient is examined daily including today on 07/17/2018, exams remain the same as of yesterday except that has changed    General:  NAD, morbid obesity   Cardiovascular: RRR  Respiratory: bilateral mild wheezing  Abdomen: Soft/ND/NT, positive BS  Musculoskeletal: left lower extremity Edema/erythema/tender/warm to touch, no open wound  Neuro: alert, oriented       Data Reviewed: Basic Metabolic Panel: Recent Labs  Lab 07/15/18 0341 07/16/18 0607 07/17/18 0637  NA 135 137 134*  K 3.4* 4.4 4.3  CL 98 99 92*  CO2 27 28 33*  GLUCOSE 119* 94 121*  BUN 13 14 17   CREATININE 0.84 0.79 0.70  CALCIUM 8.3* 8.0* 8.2*   Liver Function Tests: Recent Labs  Lab 07/15/18 0341  AST 23  ALT 19  ALKPHOS 74  BILITOT 1.3*  PROT 6.5  ALBUMIN 3.0*   No results for input(s): LIPASE, AMYLASE in the last 168 hours. No results for input(s): AMMONIA in the last 168 hours. CBC: Recent Labs  Lab 07/15/18 0341 07/16/18 0607 07/17/18 0637  WBC 29.7* 26.7* 16.0*  NEUTROABS 27.4*  23.6*  --   HGB 14.9 13.9 13.6  HCT 49.2 49.2 48.2  MCV 86.9 93.9 92.3  PLT 166 118* 107*   Cardiac Enzymes:   No results for input(s): CKTOTAL, CKMB, CKMBINDEX, TROPONINI in the last 168 hours. BNP (last 3 results) Recent Labs    12/08/17 1647  BNP 19.7    ProBNP (last 3 results) No results for input(s): PROBNP in the last 8760 hours.  CBG: Recent Labs  Lab 07/15/18 0826 07/16/18 0740 07/17/18 0757  GLUCAP 104* 104* 125*    Recent Results (from the past 240 hour(s))  Culture, blood (x 2)     Status: None  (Preliminary result)   Collection Time: 07/15/18  5:26 AM  Result Value Ref Range Status   Specimen Description   Final    BLOOD RIGHT ANTECUBITAL Performed at [facility], [address] [place], [place] [address]    Special Requests   Final    BOTTLES DRAWN AEROBIC AND ANAEROBIC Blood Culture adequate volume Performed at [facility], [address] [place], [place] [address]    Culture   Final    NO GROWTH 2 DAYS Performed at [facility], [address] [place], [place] [address]    Report Status PENDING  Incomplete  Culture, blood (x 2)     Status: None (Preliminary result)   Collection Time: 07/15/18  5:31 AM  Result Value Ref Range Status   Specimen Description   Final    BLOOD RIGHT UPPER ARM Performed at [facility], [address], [place], [place] [address]    Special Requests   Final    BOTTLES DRAWN AEROBIC AND ANAEROBIC Blood Culture results may not be optimal due to an excessive volume of blood received in culture bottles Performed at [facility], [address], [place], [place] [address]    Culture   Final    NO GROWTH 2 DAYS Performed at [facility], [address], [place], [place] [address]    Report Status PENDING  Incomplete     Studies: No results found.  Scheduled Meds: . furosemide  40 mg Intravenous Q12H  . sodium chloride flush  3 mL Intravenous Q12H  . sodium chloride flush  3 mL Intravenous Q12H    Continuous Infusions: . sodium chloride    .  ceFAZolin (ANCEF) IV Stopped (07/17/18 1346)     Time spent: I have personally reviewed and interpreted on  07/17/2018 daily labs, imagings as discussed above under date review session and assessment and plans.  I reviewed all nursing notes, pharmacy notes, vitals, pertinent old records  I have discussed plan of care as described above with RN , patient  on 07/17/2018   Albertine Grates MD, PhD  [facility] Pager [phone]. If 7PM-7AM, please contact night-coverage at [email or web address], password [facility] 07/17/2018, 2:57 PM  LOS: 2 days

## 2018-07-18 LAB — CBC WITH DIFFERENTIAL/PLATELET
Abs Immature Granulocytes: 0.1 10*3/uL — ABNORMAL HIGH (ref 0.00–0.07)
BASOS PCT: 0 %
Basophils Absolute: 0 10*3/uL (ref 0.0–0.1)
Eosinophils Absolute: 0.2 10*3/uL (ref 0.0–0.5)
Eosinophils Relative: 1 %
HEMATOCRIT: 47.8 % (ref 39.0–52.0)
Hemoglobin: 13.4 g/dL (ref 13.0–17.0)
Immature Granulocytes: 1 %
Lymphocytes Relative: 3 %
Lymphs Abs: 0.5 10*3/uL — ABNORMAL LOW (ref 0.7–4.0)
MCH: 25.9 pg — ABNORMAL LOW (ref 26.0–34.0)
MCHC: 28 g/dL — ABNORMAL LOW (ref 30.0–36.0)
MCV: 92.5 fL (ref 80.0–100.0)
Monocytes Absolute: 1.9 10*3/uL — ABNORMAL HIGH (ref 0.1–1.0)
Monocytes Relative: 14 %
Neutro Abs: 10.6 10*3/uL — ABNORMAL HIGH (ref 1.7–7.7)
Neutrophils Relative %: 81 %
Platelets: 118 10*3/uL — ABNORMAL LOW (ref 150–400)
RBC: 5.17 MIL/uL (ref 4.22–5.81)
RDW: 14.7 % (ref 11.5–15.5)
WBC: 13.3 10*3/uL — ABNORMAL HIGH (ref 4.0–10.5)
nRBC: 0 % (ref 0.0–0.2)

## 2018-07-18 LAB — BASIC METABOLIC PANEL
Anion gap: 7 (ref 5–15)
BUN: 19 mg/dL (ref 6–20)
CO2: 38 mmol/L — ABNORMAL HIGH (ref 22–32)
Calcium: 8.5 mg/dL — ABNORMAL LOW (ref 8.9–10.3)
Chloride: 94 mmol/L — ABNORMAL LOW (ref 98–111)
Creatinine, Ser: 0.64 mg/dL (ref 0.61–1.24)
GFR calc non Af Amer: >60 mL/min (ref >60)
Glucose, Bld: 122 mg/dL — ABNORMAL HIGH (ref 70–99)
Potassium: 4.4 mmol/L (ref 3.5–5.1)
Sodium: 139 mmol/L (ref 135–145)

## 2018-07-18 LAB — GLUCOSE, CAPILLARY
Glucose-Capillary: 117 mg/dL — ABNORMAL HIGH (ref 70–99)
Glucose-Capillary: 122 mg/dL — ABNORMAL HIGH (ref 70–99)

## 2018-07-18 LAB — MAGNESIUM: MAGNESIUM: 1.8 mg/dL (ref 1.7–2.4)

## 2018-07-18 NOTE — Progress Notes (Signed)
PROGRESS NOTE  Janae BridgemanDouglas L Robbs YQM:[id number] DOB: 03-20-1981 DOA: 07/15/2018 PCP: Annita BrodAsenso, Philip, MD  HPI/Recap of past 24 hours:  Fever resolved, last fever on 1/1 1am, less  Tachycardia, leukocytosis is improving  Report less leg pain  Some wheezing and cough, denies chest pain Reports no pcp until 3 weeks ago when he started to be swollen, he is started on lasix  Assessment/Plan: Principal Problem:   Sepsis due to cellulitis [facility](HCC) Active Problems:   Morbid obesity with body mass index of 70 and over in adult [facility](HCC)   Obstructive sleep apnea   Essential hypertension   Acute on chronic diastolic CHF (congestive heart failure) (HCC)   Hypokalemia   Acute on chronic respiratory failure with hypoxia (HCC)  Sepsis  With extensive cellulitis, left lower extremity -entire left lower leg, circumferential erythema, erythema extending up to right medial thigh -with fever, leukocytosis, lactic acidosis on presentation -left Lower extremity Doppler Limited study without definitive DVT noted.  Left lower extremity soft tissue ultrasound no abscess. blood culture no growth -was on IV vancomycin, cefepime and Flagyl, improving, abx changed to ancef on 1/2 -Leukocytosis improving, fever resolved .  Blood culture no growth, MRSA screening negative .if leg erythema persists, may need to repeat us to rule out forming abscess  Thrombocytopenia: Mild, plt 118- 107 monitor  Acute on chronic diastolic chf/acute hypoxic respiratory failure, - o2 83% on room air, denies feeling sob, reports chronic low o2, denies chest pain -+ edema, + wheezing, dry cough, cxr with interstitial edema -continue iv lasix 40mg  bid, monitor bp, cr, continue 02 supplement, wean as tolerated (patient reports he was sent home on home O2 a few years ago, although he has not needed it for a long time)  HTN:  Presents with sepsis, home bp meds held since admission On iv lasix for now  Smoker: smoking cessation  education provided, declined nicotine patch  Morbid obesity/OSA on cpap Body mass index is 74.34 kg/m.  Code Status: full  Family Communication: patient and girlfriend at bedside  Disposition Plan: home in 1-2 days, need continue diuresis, needs abx, need to wean o2   Consultants:  none  Procedures:  none  Antibiotics:  As above   Objective: BP (!) 144/77 (BP Location: Left Arm)   Pulse 98   Temp 98 F (36.7 C) (Oral)   Resp 20   Ht 5\' 10"  (1.778 m)   Wt (!) 235 kg   SpO2 93%   BMI 74.34 kg/m   Intake/Output Summary (Last 24 hours) at 07/18/2018 1608 Last data filed at 07/18/2018 1400 Gross per 24 hour  Intake 1240.94 ml  Output 1870 ml  Net -629.06 ml   Filed Weights   07/15/18 0301 07/15/18 0448 07/16/18 0500  Weight: (!) 204.1 kg (!) 235 kg (!) 235 kg    Exam: Patient is examined daily including today on 07/18/2018, exams remain the same as of yesterday except that has changed    General:  NAD, morbid obesity   Cardiovascular: RRR  Respiratory: bilateral mild wheezing  Abdomen: Soft/ND/NT, positive BS  Musculoskeletal: left lower extremity Edema/erythema/tender/warm to touch, no open wound, right lower extremity no edema  Neuro: alert, oriented       Data Reviewed: Basic Metabolic Panel: Recent Labs  Lab 07/15/18 0341 07/16/18 0607 07/17/18 0637 07/18/18 0742  NA 135 137 134* 139  K 3.4* 4.4 4.3 4.4  CL 98 99 92* 94*  CO2 27 28 33* 38*  GLUCOSE 119* 94 121*  122*  BUN 13 14 17 19   CREATININE 0.84 0.79 0.70 0.64  CALCIUM 8.3* 8.0* 8.2* 8.5*  MG  --   --   --  1.8   Liver Function Tests: Recent Labs  Lab 07/15/18 0341  AST 23  ALT 19  ALKPHOS 74  BILITOT 1.3*  PROT 6.5  ALBUMIN 3.0*   No results for input(s): LIPASE, AMYLASE in the last 168 hours. No results for input(s): AMMONIA in the last 168 hours. CBC: Recent Labs  Lab 07/15/18 0341 07/16/18 0607 07/17/18 0637 07/18/18 0742  WBC 29.7* 26.7* 16.0* 13.3*    NEUTROABS 27.4* 23.6*  --  10.6*  HGB 14.9 13.9 13.6 13.4  HCT 49.2 49.2 48.2 47.8  MCV 86.9 93.9 92.3 92.5  PLT 166 118* 107* 118*   Cardiac Enzymes:   No results for input(s): CKTOTAL, CKMB, CKMBINDEX, TROPONINI in the last 168 hours. BNP (last 3 results) Recent Labs    12/08/17 1647  BNP 19.7    ProBNP (last 3 results) No results for input(s): PROBNP in the last 8760 hours.  CBG: Recent Labs  Lab 07/15/18 0826 07/16/18 0740 07/17/18 0757 07/18/18 0804 07/18/18 1206  GLUCAP 104* 104* 125* 117* 122*    Recent Results (from the past 240 hour(s))  Culture, blood (x 2)     Status: None (Preliminary result)   Collection Time: 07/15/18  5:26 AM  Result Value Ref Range Status   Specimen Description BLOOD RIGHT ANTECUBITAL  Final   Special Requests   Final    BOTTLES DRAWN AEROBIC AND ANAEROBIC Blood Culture adequate volume Performed at [facility], [address] [place], [place] [address]    Culture NO GROWTH 3 DAYS  Final   Report Status PENDING  Incomplete  Culture, blood (x 2)     Status: None (Preliminary result)   Collection Time: 07/15/18  5:31 AM  Result Value Ref Range Status   Specimen Description BLOOD RIGHT UPPER ARM  Final   Special Requests   Final    BOTTLES DRAWN AEROBIC AND ANAEROBIC Blood Culture results may not be optimal due to an excessive volume of blood received in culture bottles Performed at [facility], [address], [place], [place] [address]    Culture NO GROWTH 3 DAYS  Final   Report Status PENDING  Incomplete  MRSA PCR Screening     Status: None   Collection Time: 07/17/18  3:51 PM  Result Value Ref Range Status   MRSA by PCR NEGATIVE NEGATIVE Final    Comment:        The GeneXpert MRSA Assay (FDA approved for NASAL specimens only), is one component of a comprehensive MRSA colonization surveillance program. It is not intended to diagnose MRSA infection nor to guide or monitor treatment  for MRSA infections. Performed at [facility], [address] [place], [place] [address]      Studies: No results found.  Scheduled Meds: . furosemide  40 mg Intravenous Q12H  . senna-docusate  1 tablet Oral BID  . sodium chloride flush  3 mL Intravenous Q12H  . sodium chloride flush  3 mL Intravenous Q12H    Continuous Infusions: . sodium chloride    .  ceFAZolin (ANCEF) IV 2 g (07/18/18 1403)     Time spent: I have personally reviewed and interpreted on  07/18/2018 daily labs, imagings as discussed above under date review session and assessment and plans.  I reviewed all nursing  notes, pharmacy notes, vitals, pertinent old records  I have discussed plan of care as described above with RN , patient  And girlfriend on 07/18/2018   Albertine Grates MD, PhD  [facility] Pager [phone]. If 7PM-7AM, please contact night-coverage at [email or web address], password [facility] 07/18/2018, 4:08 PM  LOS: 3 days

## 2018-07-18 NOTE — Evaluation (Signed)
Physical Therapy Evaluation Patient Details Name: Andre [name] MRN: [id number] DOB: Dec 07, 1980 Today's Date: 07/18/2018   History of Present Illness  38 year old morbidly obese male (BMI of 71), chronic diastolic CHF, OSA on CPAP, hypertension and recurrent cellulitis admitted with acute onset of chills with left leg swelling, redness and warmth with tenderness  Clinical Impression  Patient presents with decreased mobility due to pain in the L LE.  He is able to tolerate limited OOB this session and transferred to wide w/c in the room.  Feel may benefit from wrapping the leg to improve tolerance.  PT to follow acutely, and hopefully can improve to go home with HHPT at d/c.     Follow Up Recommendations Supervision/Assistance - 24 hour;Home health PT    Equipment Recommendations  Rolling walker with 5" wheels(bartiatric)    Recommendations for Other Services       Precautions / Restrictions Precautions Precautions: Fall      Mobility  Bed Mobility Overal bed mobility: Needs Assistance Bed Mobility: Supine to Sit;Sit to Supine     Supine to sit: Mod assist;HOB elevated;+2 for safety/equipment Sit to supine: Mod assist;+2 for safety/equipment   General bed mobility comments: mostly helped himself, but +2 present for safety, severe pain lowering the leg to the ground, sat EOB about 10 minutes waiting for pain to get better, to supine assist for repositinoing, but pt lifted legs into bed  Transfers Overall transfer level: Needs assistance Equipment used: Rolling walker (2 wheeled) Transfers: Sit to/from [employer] Transfers Sit to Stand: +2 physical assistance;Min assist;+2 safety/equipment Stand pivot transfers: Min assist;+2 safety/equipment       General transfer comment: able to stand mostly on his own, assist for safety, and cues for walker use to pivot to wide w/c in room propped up L LE on trash can with pain improved in about 5 minute to 2/10, then returned  to bed via pivot again with walker pt eager to get leg elevated due to pain  Ambulation/Gait                Stairs            Wheelchair Mobility    Modified Rankin (Stroke Patients Only)       Balance Overall balance assessment: Needs assistance   Sitting balance-Leahy Scale: Good Sitting balance - Comments: even sitting EOB with air mattress and significant pain on L LE did not slide off EOB    Standing balance support: Bilateral upper extremity supported Standing balance-Leahy Scale: Poor Standing balance comment: due to pain                              Pertinent Vitals/Pain Pain Assessment: Faces Faces Pain Scale: Hurts worst Pain Location: L LE with dependency Pain Descriptors / Indicators: Throbbing;Aching;Tender Pain Intervention(s): Monitored during session;Repositioned;Patient requesting pain meds-RN notified;RN gave pain meds during session;Relaxation    Home Living Family/patient expects to be discharged to:: Private residence Living Arrangements: Spouse/significant other Available Help at Discharge: Family Type of Home: House Home Access: Ramped entrance     Home Layout: One level Home Equipment: None      Prior Function Level of Independence: Independent               Hand Dominance        Extremity/Trunk Assessment   Upper Extremity Assessment Upper Extremity Assessment: Overall WFL for tasks assessed    Lower Extremity Assessment  Lower Extremity Assessment: LLE deficits/detail LLE Deficits / Details: lymphedema present, significant errythema and edema       Communication   Communication: No difficulties  Cognition Arousal/Alertness: Awake/alert Behavior During Therapy: WFL for tasks assessed/performed Overall Cognitive Status: Within Functional Limits for tasks assessed                                        General Comments      Exercises     Assessment/Plan    PT Assessment  Patient needs continued PT services  PT Problem List Decreased mobility;Pain;Decreased activity tolerance;Decreased knowledge of use of DME       PT Treatment Interventions DME instruction;Functional mobility training;Balance training;Patient/family education;Gait training;Therapeutic activities;Therapeutic exercise    PT Goals (Current goals can be found in the Care Plan section)  Acute Rehab PT Goals Patient Stated Goal: to improve pain PT Goal Formulation: With patient Time For Goal Achievement: 08/01/18 Potential to Achieve Goals: Good    Frequency Min 3X/week   Barriers to discharge        Co-evaluation               AM-PAC PT "6 Clicks" Mobility  Outcome Measure Help needed turning from your back to your side while in a flat bed without using bedrails?: A Little Help needed moving from lying on your back to sitting on the side of a flat bed without using bedrails?: A Little Help needed moving to and from a bed to a chair (including a wheelchair)?: A Lot Help needed standing up from a chair using your arms (e.g., wheelchair or bedside chair)?: A Lot Help needed to walk in hospital room?: Total Help needed climbing 3-5 steps with a railing? : Total 6 Click Score: 12    End of Session Equipment Utilized During Treatment: Other (comment)(c-pap) Activity Tolerance: Patient limited by pain Patient left: in bed;with call bell/phone within reach;with family/visitor present;with nursing/sitter in room   PT Visit Diagnosis: Other abnormalities of gait and mobility (R26.89);Pain Pain - Right/Left: Left Pain - part of body: Leg    Time: 1100-1139 PT Time Calculation (min) (ACUTE ONLY): 39 min   Charges:   PT Evaluation $PT Eval Moderate Complexity: 1 Mod PT Treatments $Therapeutic Activity: 23-37 mins        Sheran Lawless, [place] [facility] [phone] 07/18/2018   Elray Mcgregor 07/18/2018, 3:07 PM

## 2018-07-19 DIAGNOSIS — L0390 Cellulitis, unspecified: Secondary | ICD-10-CM

## 2018-07-19 DIAGNOSIS — A419 Sepsis, unspecified organism: Principal | ICD-10-CM

## 2018-07-19 LAB — CBC WITH DIFFERENTIAL/PLATELET
Abs Immature Granulocytes: 0.16 10*3/uL — ABNORMAL HIGH (ref 0.00–0.07)
Basophils Absolute: 0.1 10*3/uL (ref 0.0–0.1)
Basophils Relative: 1 %
Eosinophils Absolute: 0.1 10*3/uL (ref 0.0–0.5)
Eosinophils Relative: 1 %
HCT: 49.7 % (ref 39.0–52.0)
Hemoglobin: 14.1 g/dL (ref 13.0–17.0)
Immature Granulocytes: 1 %
LYMPHS PCT: 5 %
Lymphs Abs: 0.7 10*3/uL (ref 0.7–4.0)
MCH: 25.9 pg — ABNORMAL LOW (ref 26.0–34.0)
MCHC: 28.4 g/dL — ABNORMAL LOW (ref 30.0–36.0)
MCV: 91.4 fL (ref 80.0–100.0)
Monocytes Absolute: 2.3 10*3/uL — ABNORMAL HIGH (ref 0.1–1.0)
Monocytes Relative: 18 %
Neutro Abs: 9.4 10*3/uL — ABNORMAL HIGH (ref 1.7–7.7)
Neutrophils Relative %: 74 %
Platelets: 128 10*3/uL — ABNORMAL LOW (ref 150–400)
RBC: 5.44 MIL/uL (ref 4.22–5.81)
RDW: 14.9 % (ref 11.5–15.5)
WBC: 12.6 10*3/uL — ABNORMAL HIGH (ref 4.0–10.5)
nRBC: 0 % (ref 0.0–0.2)

## 2018-07-19 LAB — BASIC METABOLIC PANEL
Anion gap: 12 (ref 5–15)
BUN: 17 mg/dL (ref 6–20)
CO2: 39 mmol/L — AB (ref 22–32)
Calcium: 8.5 mg/dL — ABNORMAL LOW (ref 8.9–10.3)
Chloride: 90 mmol/L — ABNORMAL LOW (ref 98–111)
Creatinine, Ser: 0.51 mg/dL — ABNORMAL LOW (ref 0.61–1.24)
GFR calc Af Amer: >60 mL/min (ref >60)
GFR calc non Af Amer: >60 mL/min (ref >60)
Glucose, Bld: 90 mg/dL (ref 70–99)
Potassium: 3.7 mmol/L (ref 3.5–5.1)
Sodium: 141 mmol/L (ref 135–145)

## 2018-07-19 LAB — GLUCOSE, CAPILLARY: Glucose-Capillary: 90 mg/dL (ref 70–99)

## 2018-07-19 LAB — MAGNESIUM: Magnesium: 1.6 mg/dL — ABNORMAL LOW (ref 1.7–2.4)

## 2018-07-19 MED ORDER — MAGNESIUM SULFATE 2 GM/50ML IV SOLN
2.0000 g | Freq: Once | INTRAVENOUS | Status: AC
  Administered 2018-07-19: 2 g via INTRAVENOUS
  Filled 2018-07-19: qty 50

## 2018-07-19 MED ORDER — LISINOPRIL 5 MG PO TABS
5.0000 mg | ORAL_TABLET | Freq: Every day | ORAL | Status: DC
  Administered 2018-07-19 – 2018-07-20 (×2): 5 mg via ORAL
  Filled 2018-07-19 (×2): qty 1

## 2018-07-19 NOTE — Progress Notes (Signed)
Left lower leg cellulitis swelling appears to be decreasing in circumference. Blistering of skin ( anterior lower leg) remains very painful & fragile - placed vasoline gauze & kerlix wrap on leg.

## 2018-07-19 NOTE — Progress Notes (Signed)
PROGRESS NOTE    Andre [name]  ZOX:[id number] DOB: 03-21-1981 DOA: 07/15/2018 PCP: Annita Brod, MD   Brief Narrative:  38 year old with past medical history relevant for obesity, chronic diastolic heart failure, hypertension, obstructive sleep apnea, recurrent lower extremity cellulitis presenting with left lower extremity cellulitis.   Assessment & Plan:   Principal Problem:   Sepsis due to cellulitis [facility]) Active Problems:   Morbid obesity with body mass index of 70 and over in adult [facility])   Obstructive sleep apnea   Essential hypertension   Acute on chronic diastolic CHF (congestive heart failure) (HCC)   Hypokalemia   Acute on chronic respiratory failure with hypoxia (HCC)   #) Left lower extremity cellulitis: Currently patient appears to be somewhat improving but still continues to have significant amounts of erythema, tenderness, swelling.  He is improvement at least per review of the x-rays does not appear to be much improved. -Continue IV cefazolin started 07/16/2018 -Blood cultures 07/15/2018 no growth to date - We will order CT right lower extremity as ultrasound was not particularly diagnostic  #) Acute on chronic diastolic heart failure exacerbation: Patient was noted to be wheezing and have intermittent hypoxia.  Unclear how much of this is related to obesity hypoventilation syndrome versus possible diastolic heart failure exacerbation. -Continue IV furosemide twice daily -Strict ins and outs, weigh daily -Sodium restricted diet -Hold home oral furosemide -Continue PRN albuterol  #) Hypertension: - Continue P 10 mg daily -Continue lisinopril 5 mg daily  #) OSA: -Continue CPAP  Fluids: Tolerating p.o. Electrolytes: Monitor and supplement Nutrition: Heart healthy diet  Prophylaxis: Enoxaparin  Disposition: Pending imaging and improved cellulitis  Full code   Consultants:   None  Procedures:   Left lower extremity ultrasound 07/16/2018: Limited  but no apparent DVT  Antimicrobials:   IV cefazolin started 07/16/2018   Subjective: This morning patient is quite sleepy but does not appear to be in distress.  He does not have any complaints at this time.  Objective: Vitals:   07/18/18 0531 07/18/18 1536 07/18/18 2042 07/19/18 0458  BP: (!) 152/97 (!) 144/77 (!) 183/96 (!) 148/79  Pulse: 99 98 96 95  Resp: (!) 24 20 20 20   Temp: (!) 97.3 F (36.3 C) 98 F (36.7 C) 98.7 F (37.1 C) 99 F (37.2 C)  TempSrc:  Oral    SpO2: 95% 93% 99% 97%  Weight:      Height:        Intake/Output Summary (Last 24 hours) at 07/19/2018 1415 Last data filed at 07/19/2018 8119 Gross per 24 hour  Intake 100 ml  Output 2550 ml  Net -2450 ml   Filed Weights   07/15/18 0301 07/15/18 0448 07/16/18 0500  Weight: (!) 204.1 kg (!) 235 kg (!) 235 kg    Examination:  General exam: Appears calm and comfortable  Respiratory system: Anterior lung sounds clear, currently on CPAP. Cardiovascular system: Distant heart sounds, regular rate and rhythm, no murmurs Gastrointestinal system: Abdomen is nondistended, soft and nontender. No organomegaly or masses felt. Normal bowel sounds heard. Central nervous system: Grossly intact, moving all extremities Extremities: Bilateral lower extremity edema, left lower extremity wrapped Skin: Left lower extremity wrapped, circumferential erythema decreasing from marked area Psychiatry: Judgement and insight appear normal. Mood & affect appropriate.     Data Reviewed: I have personally reviewed following labs and imaging studies  CBC: Recent Labs  Lab 07/15/18 0341 07/16/18 0607 07/17/18 0637 07/18/18 0742 07/19/18 0614  WBC 29.7* 26.7* 16.0*  13.3* 12.6*  NEUTROABS 27.4* 23.6*  --  10.6* 9.4*  HGB 14.9 13.9 13.6 13.4 14.1  HCT 49.2 49.2 48.2 47.8 49.7  MCV 86.9 93.9 92.3 92.5 91.4  PLT 166 118* 107* 118* 128*   Basic Metabolic Panel: Recent Labs  Lab 07/15/18 0341 07/16/18 0607 07/17/18 0637  07/18/18 0742 07/19/18 0614  NA 135 137 134* 139 141  K 3.4* 4.4 4.3 4.4 3.7  CL 98 99 92* 94* 90*  CO2 27 28 33* 38* 39*  GLUCOSE 119* 94 121* 122* 90  BUN 13 14 17 19 17   CREATININE 0.84 0.79 0.70 0.64 0.51*  CALCIUM 8.3* 8.0* 8.2* 8.5* 8.5*  MG  --   --   --  1.8 1.6*   GFR: Estimated Creatinine Clearance: 244 mL/min (A) (by C-G formula based on SCr of 0.51 mg/dL (L)). Liver Function Tests: Recent Labs  Lab 07/15/18 0341  AST 23  ALT 19  ALKPHOS 74  BILITOT 1.3*  PROT 6.5  ALBUMIN 3.0*   No results for input(s): LIPASE, AMYLASE in the last 168 hours. No results for input(s): AMMONIA in the last 168 hours. Coagulation Profile: No results for input(s): INR, PROTIME in the last 168 hours. Cardiac Enzymes: No results for input(s): CKTOTAL, CKMB, CKMBINDEX, TROPONINI in the last 168 hours. BNP (last 3 results) No results for input(s): PROBNP in the last 8760 hours. HbA1C: No results for input(s): HGBA1C in the last 72 hours. CBG: Recent Labs  Lab 07/16/18 0740 07/17/18 0757 07/18/18 0804 07/18/18 1206 07/19/18 0754  GLUCAP 104* 125* 117* 122* 90   Lipid Profile: No results for input(s): CHOL, HDL, LDLCALC, TRIG, CHOLHDL, LDLDIRECT in the last 72 hours. Thyroid Function Tests: No results for input(s): TSH, T4TOTAL, FREET4, T3FREE, THYROIDAB in the last 72 hours. Anemia Panel: No results for input(s): VITAMINB12, FOLATE, FERRITIN, TIBC, IRON, RETICCTPCT in the last 72 hours. Sepsis Labs: Recent Labs  Lab 07/15/18 0351 07/15/18 0828 07/15/18 1104  LATICACIDVEN 2.53* 1.3 1.3    Recent Results (from the past 240 hour(s))  Culture, blood (x 2)     Status: None (Preliminary result)   Collection Time: 07/15/18  5:26 AM  Result Value Ref Range Status   Specimen Description BLOOD RIGHT ANTECUBITAL  Final   Special Requests   Final    BOTTLES DRAWN AEROBIC AND ANAEROBIC Blood Culture adequate volume Performed at [facility], [address] [place], [place] [address]    Culture NO GROWTH 4 DAYS  Final   Report Status PENDING  Incomplete  Culture, blood (x 2)     Status: None (Preliminary result)   Collection Time: 07/15/18  5:31 AM  Result Value Ref Range Status   Specimen Description BLOOD RIGHT UPPER ARM  Final   Special Requests   Final    BOTTLES DRAWN AEROBIC AND ANAEROBIC Blood Culture results may not be optimal due to an excessive volume of blood received in culture bottles Performed at [facility], [address] [place], [place] [address]    Culture NO GROWTH 4 DAYS  Final   Report Status PENDING  Incomplete  MRSA PCR Screening     Status: None   Collection Time: 07/17/18  3:51 PM  Result Value Ref Range Status   MRSA by PCR NEGATIVE NEGATIVE Final    Comment:        The GeneXpert MRSA Assay (FDA approved for NASAL specimens only), is one component of a comprehensive MRSA colonization surveillance  program. It is not intended to diagnose MRSA infection nor to guide or monitor treatment for MRSA infections. Performed at [facility], [address], [place], [place] [address]          Radiology Studies: No results found.      Scheduled Meds: . furosemide  40 mg Intravenous Q12H  . senna-docusate  1 tablet Oral BID  . sodium chloride flush  3 mL Intravenous Q12H  . sodium chloride flush  3 mL Intravenous Q12H   Continuous Infusions: . sodium chloride    .  ceFAZolin (ANCEF) IV 2 g (07/19/18 0616)     LOS: 4 days    Time spent: 35    Delaine Lame, MD [facility]   If 7PM-7AM, please contact night-coverage [email or web address] Password [email or web address] 07/19/2018, 2:15 PM

## 2018-07-20 LAB — BASIC METABOLIC PANEL
Anion gap: 9 (ref 5–15)
BUN: 18 mg/dL (ref 6–20)
CO2: 44 mmol/L — ABNORMAL HIGH (ref 22–32)
Calcium: 8.3 mg/dL — ABNORMAL LOW (ref 8.9–10.3)
Chloride: 88 mmol/L — ABNORMAL LOW (ref 98–111)
Creatinine, Ser: 0.66 mg/dL (ref 0.61–1.24)
GFR calc Af Amer: >60 mL/min (ref >60)
GFR calc non Af Amer: >60 mL/min (ref >60)
Glucose, Bld: 110 mg/dL — ABNORMAL HIGH (ref 70–99)
Potassium: 3.7 mmol/L (ref 3.5–5.1)
Sodium: 141 mmol/L (ref 135–145)

## 2018-07-20 LAB — CULTURE, BLOOD (ROUTINE X 2)
Culture: NO GROWTH
Culture: NO GROWTH
Special Requests: ADEQUATE

## 2018-07-20 LAB — CBC
HCT: 48.4 % (ref 39.0–52.0)
Hemoglobin: 14 g/dL (ref 13.0–17.0)
MCH: 26.1 pg (ref 26.0–34.0)
MCHC: 28.9 g/dL — ABNORMAL LOW (ref 30.0–36.0)
MCV: 90.1 fL (ref 80.0–100.0)
Platelets: 172 K/uL (ref 150–400)
RBC: 5.37 MIL/uL (ref 4.22–5.81)
RDW: 14.8 % (ref 11.5–15.5)
WBC: 10.4 10*3/uL (ref 4.0–10.5)
nRBC: 0 % (ref 0.0–0.2)

## 2018-07-20 LAB — GLUCOSE, CAPILLARY
Glucose-Capillary: 93 mg/dL (ref 70–99)
Glucose-Capillary: 144 mg/dL — ABNORMAL HIGH (ref 70–99)

## 2018-07-20 LAB — MAGNESIUM: Magnesium: 1.9 mg/dL (ref 1.7–2.4)

## 2018-07-20 MED ORDER — AMOXICILLIN 875 MG PO TABS: 875.0000 mg | ORAL_TABLET | Freq: Two times a day (BID) | ORAL | 0 refills | Status: AC

## 2018-07-20 MED ORDER — MAGNESIUM CITRATE PO SOLN: 1.0000 | Freq: Every day | ORAL | Status: DC | PRN

## 2018-07-20 NOTE — Care Management Note (Signed)
Case Management Note  Patient Details  Name: Andre [name] MRN: [id number] Date of Birth: 1981-04-18  Subjective/Objective:                  dme wheelchair with seat cushion   Action/Plan: Request sent to Alfonse Flavors with [facility]  Expected Discharge Date:  07/20/18               Expected Discharge Plan:  Home/Self Care  In-House Referral:     Discharge planning Services  CM Consult  Post Acute Care Choice:  Durable Medical Equipment Choice offered to:     DME Arranged:  Wheelchair manual DME Agency:  [facility]  HH Arranged:    [facility] Agency:     Status of Service:  Completed, signed off  If discussed at Long Length of Stay Meetings, dates discussed:    Additional Comments:  Golda Acre, RN 07/20/2018, 11:39 AM

## 2018-07-20 NOTE — Progress Notes (Signed)
Patient d/ced home with his mother. 

## 2018-07-20 NOTE — Discharge Instructions (Signed)
Antibiotic Medicine, Adult ° °Antibiotic medicines are used to treat infections caused by bacteria, such as strep throat and urinary tract infection (UTI). Antibiotic medicines will not work for viral illnesses, such as colds or the flu (influenza). They work by killing the bacteria that is making you sick. Antibiotics can also have serious side effects. It is important that you take antibiotic medicines safely and only when needed. °When do I need to take antibiotics? °Antibiotics are medicines that treat bacterial infections. You may need antibiotics for: °· UTI. °· Strep throat. °· Meningitis. This infection affects the spinal cord and brain. °· Bacterial sinusitis. °· Serious lung infection. °You may start antibiotics while your health care provider waits for test results to come back. Common tests may include throat, urine, blood, or mucus culture. Your health care provider may change or stop the antibiotic depending on your test results. °When are antibiotics not needed? °You do not need antibiotics for most common illnesses. These illnesses may be caused by a virus, not a bacteria. You do not need antibiotics for: °· The common cold. °· Influenza. °· Sore throat. °· Discolored mucus. °· Bronchitis. °Antibiotics are not always needed for all bacterial infections. Many of these infections clear up without antibiotic treatment. Do not ask for or take antibiotics when they are not necessary. °How long should I take the antibiotic? °You must take the entire prescription. Continue to take your antibiotic for as long as told by your health care provider. Do not stop taking it even if you start to feel better. If you stop taking it too soon: °· You may start to feel sick again. °· Your infection may become harder to treat. °· Complications may develop. °Each course of antibiotics needs a different amount of time to work. Some antibiotic courses last only a few days. Some last about a week to 10 days. In some cases,  you may need to take antibiotics for a few weeks to completely treat the infection. °What if I miss a dose? °Try not to miss any doses of medicine. If you miss a dose, call your health care provider or pharmacist for advice. Sometimes it is okay to take the missed dose as soon as possible. °What are the risks of taking antibiotics? °Most antibiotics can cause an infection called Clostridioides difficile (C. difficile or C. diff), which causes severe diarrhea. This infection happens when the antibiotics kill the healthy bacteria in your intestines. This allows C. diff to grow. The infection needs to be treated right away. Let your health care provider know if: °· You have diarrhea while taking an antibiotic. °· You have diarrhea after you stop taking an antibiotic. C. diff infection can start weeks after stopping the antibiotic. °Taking an antibiotic also puts you at risk for getting a bacteria that does not respond to medicine (antibiotic-resistant infection) in the future. Antibiotics can cause bacteria to change so that if the antibiotic is taken again, the medicine is not able to kill the bacteria. These infections can be more serious and, in some cases, life-threatening. °Do antibiotics affect birth control? °Birth control pills may not work while you are on antibiotics. If you are taking birth control pills, continue taking them as usual and use a second form of birth control, such as a condom, to avoid unwanted pregnancy. Continue using the second form of birth control until your health care provider says you can stop. °What else should I know about taking antibiotics? °It is important for you to   take antibiotics exactly as told. Make sure that you: °· Take the entire course of antibiotic that was prescribed. Do not stop taking your antibiotics even if your symptoms improve. °· Take the correct amount of medicine each day. °· Ask your health care provider: °? How long to wait in between doses. °? If the  antibiotic should be taken with food. °? If there are any foods, drinks, or medicines that you should avoid while taking the antibiotics. °? If there are any side effects you should be aware of. °· Only use the antibiotics prescribed for you by your health care provider. Do not use antibiotics prescribed for someone else. °· Drink a large glass of water along with the antibiotics. °· Ask the pharmacist for a syringe, cup, or spoon that properly measures the antibiotics. °· Throw away any leftover medicine. °Contact a health care provider if: °· Your symptoms get worse. °· You have new joint pain or muscle aches that begin after starting the antibiotic. °When should I seek immediate medical care? °· You have signs of a serious allergic reaction to antibiotics. If you have signs of a severe allergic reaction, stop taking the antibiotic right away. Signs may include: °? Hives, which are raised, itchy, red bumps on the skin. °? Skin rash. °? Trouble breathing. °? A wheezing sound when you breathe. °? Swelling anywhere on your body. °? Feeling dizzy. °? Vomiting. °· Your urine turns dark or becomes blood-colored. °· Your skin turns yellow. °· You bruise or bleed easily. °· You have severe diarrhea and abdominal cramps. °· You have a severe headache. °Summary °· Antibiotic medicines are used to treat infections caused by bacteria, such as strep throat and UTIs. It is important that you take antibiotic medicines only when needed. °· Your health care provider may change or stop the antibiotic depending on your test results. °· Most antibiotics can cause an infection called Clostridioides difficile (C. difficile or C. diff), which causes severe diarrhea. Let your health care provider know if you develop diarrhea while taking an antibiotic. °· Take the entire course of antibiotic that was prescribed. °This information is not intended to replace advice given to you by your health care provider. Make sure you discuss any  questions you have with your health care provider. °Document Released: 03/13/2004 Document Revised: 12/30/2017 Document Reviewed: 07/02/2016 °[employer] Interactive Patient Education © 2019 [employer] ° °

## 2018-07-20 NOTE — Discharge Summary (Signed)
Physician Discharge Summary  Andre [name] ZOX:[id number] DOB: 1981-05-25 DOA: 07/15/2018  PCP: Annita Brod, MD  Admit date: 07/15/2018 Discharge date: 07/20/2018  Admitted From: Home Disposition:  Home  Recommendations for Outpatient Follow-up:  1. Follow up with PCP in 1-2 weeks 2. Please obtain BMP/CBC in one week   Home Health: Yes, HHPT Equipment/Devices:wheelchair  Discharge Condition: stable CODE STATUS: FULL Diet recommendation: Heart Healthy / Carb Modified  Brief/Interim Summary:  #) Left lower extremity cellulitis: Patient was admitted with left lower extremity cellulitis.  Ultrasound was limited but did not show any evidence of DVT.  Patient was given IV antibiotics and discharged home on oral amoxicillin for 2 weeks.  Blood cultures are negative.  #) Acute on chronic diastolic heart failure exacerbation: Patient was noted to have some wheezing intermittent hypoxia.  Patient was given IV furosemide.  His hypoxia and wheezing resolved.  #) Hypertension: Patient was continued on home amlodipine and lisinopril.  #) OSA: Patient was continued on home CPAP.  Discharge Diagnoses:  Principal Problem:   Sepsis due to cellulitis [facility]) Active Problems:   Morbid obesity with body mass index of 70 and over in adult [facility])   Obstructive sleep apnea   Essential hypertension   Acute on chronic diastolic CHF (congestive heart failure) (HCC)   Hypokalemia   Acute on chronic respiratory failure with hypoxia [facility])    Discharge Instructions  Discharge Instructions    Call MD for:  difficulty breathing, headache or visual disturbances   Complete by:  As directed    Call MD for:  hives   Complete by:  As directed    Call MD for:  persistant nausea and vomiting   Complete by:  As directed    Call MD for:  redness, tenderness, or signs of infection (pain, swelling, redness, odor or green/yellow discharge around incision site)   Complete by:  As directed    Call MD for:   severe uncontrolled pain   Complete by:  As directed    Call MD for:  temperature >100.4   Complete by:  As directed    Diet - low sodium heart healthy   Complete by:  As directed    Discharge instructions   Complete by:  As directed    Please take your antibiotics as prescribed.  Please follow-up with your primary care doctor in 1 to 2 weeks.   For home use only DME standard manual wheelchair with seat cushion   Complete by:  As directed    Patient suffers from lower extremity cellulitis and edema which impairs their ability to perform daily activities like bathing, dressing and grooming in the home.  A walker will not resolve  issue with performing activities of daily living. A wheelchair will allow patient to safely perform daily activities. Patient can safely propel the wheelchair in the home or has a caregiver who can provide assistance.  Accessories: elevating leg rests (ELRs), wheel locks, extensions and anti-tippers.   Increase activity slowly   Complete by:  As directed      Allergies as of 07/20/2018      Reactions   Other Hives   Steroid, but cannot remember name.      Medication List    TAKE these medications   albuterol 108 (90 Base) MCG/ACT inhaler Commonly known as:  PROVENTIL HFA;VENTOLIN HFA Inhale 2 puffs into the lungs every 6 (six) hours as needed for wheezing or shortness of breath.   amLODipine 10 MG tablet Commonly  known as:  NORVASC Take 1 tablet (10 mg total) by mouth daily.   amoxicillin 875 MG tablet Commonly known as:  AMOXIL Take 1 tablet (875 mg total) by mouth 2 (two) times daily for 14 days.   furosemide 40 MG tablet Commonly known as:  LASIX Take 1 tablet (40 mg total) by mouth daily.   ibuprofen 200 MG tablet Commonly known as:  ADVIL,MOTRIN Take 600 mg by mouth every 6 (six) hours as needed for moderate pain.   lisinopril 5 MG tablet Commonly known as:  PRINIVIL,ZESTRIL Take 1 tablet (5 mg total) by mouth daily.             Durable Medical Equipment  (From admission, onward)         Start     Ordered   07/20/18 0000  For home use only DME standard manual wheelchair with seat cushion    Comments:  Patient suffers from lower extremity cellulitis and edema which impairs their ability to perform daily activities like bathing, dressing and grooming in the home.  A walker will not resolve  issue with performing activities of daily living. A wheelchair will allow patient to safely perform daily activities. Patient can safely propel the wheelchair in the home or has a caregiver who can provide assistance.  Accessories: elevating leg rests (ELRs), wheel locks, extensions and anti-tippers.   07/20/18 1132          Allergies  Allergen Reactions  . Other Hives    Steroid, but cannot remember name.    Consultations:  None   Procedures/Studies: Dg Chest 2 View  Result Date: 07/15/2018 CLINICAL DATA:  Acute onset of left lower leg pain and erythema. EXAM: CHEST - 2 VIEW COMPARISON:  Chest radiograph performed 01/23/2018 FINDINGS: The lungs are well-aerated. Vascular congestion is noted. Increased interstitial markings may reflect mild interstitial edema. There is no evidence of pleural effusion or pneumothorax. The heart is borderline normal in size. No acute osseous abnormalities are seen. IMPRESSION: Vascular congestion. Increased interstitial markings may reflect mild interstitial edema. Electronically Signed   By: Roanna Raider M.D.   On: 07/15/2018 03:34   [place] Lt Lower Extrem Ltd Soft Tissue Non Vascular  Result Date: 07/15/2018 CLINICAL DATA:  Acute onset of left lower leg swelling and erythema. Sepsis due to cellulitis. EXAM: ULTRASOUND LEFT LOWER EXTREMITY LIMITED TECHNIQUE: Ultrasound examination of the lower extremity soft tissues was performed in the area of clinical concern. COMPARISON:  None. FINDINGS: Joint Space: N/A Muscles: The musculature is not well characterized on this study. Tendons: N/A Other  Soft Tissue Structures: There is mild diffuse soft tissue edema about the left lower leg, with overlying erythema. No fluid collection is seen to suggest abscess. No mass is identified. IMPRESSION: Mild diffuse soft tissue edema about the left lower leg, with overlying erythema. No evidence of abscess. Electronically Signed   By: Roanna Raider M.D.   On: 07/15/2018 07:17   Vas [place] Lower Extremity Venous (dvt)  Result Date: 07/16/2018  Lower Venous Study Indications: Pain, Swelling, Edema, and Erythema.  Risk Factors: Obesity. Limitations: Body habitus and severe edema and pain. Comparison Study: Left lower extremity venous duplex exam on 12/09/2017 Performing Technologist: Melodie Bouillon  Examination Guidelines: A complete evaluation includes B-mode imaging, spectral Doppler, color Doppler, and power Doppler as needed of all accessible portions of each vessel. Bilateral testing is considered an integral part of a complete examination. Limited examinations for reoccurring indications may be performed as noted.  Right  Venous Findings: +---+---------------+---------+-----------+----------+-------------------------+    CompressibilityPhasicitySpontaneityPropertiesSummary                   +---+---------------+---------+-----------+----------+-------------------------+ CFV                                             Patent in doppler. Unable                                                 to well visualized with                                                   compression.              +---+---------------+---------+-----------+----------+-------------------------+  Left Venous Findings: +---------+---------------+---------+-----------+----------+-------------------+          CompressibilityPhasicitySpontaneityPropertiesSummary             +---------+---------------+---------+-----------+----------+-------------------+ CFV                                                   Patent  in doppler.                                                        Unable to well                                                            visualized with                                                           compression.        +---------+---------------+---------+-----------+----------+-------------------+ SFJ                                                   Patent in doppler.                                                        Unable to well  visualized with                                                           compression.        +---------+---------------+---------+-----------+----------+-------------------+ FV Prox                                               Patent in doppler.                                                        Unable to well                                                            visualized with                                                           compression.        +---------+---------------+---------+-----------+----------+-------------------+ FV Mid                                                Patent in doppler.                                                        Unable to well                                                            visualized with                                                           compression.        +---------+---------------+---------+-----------+----------+-------------------+ FV Distal                                             Not visualized      +---------+---------------+---------+-----------+----------+-------------------+ PFV  Not visualized      +---------+---------------+---------+-----------+----------+-------------------+ POP                                                   Patent in  doppler.                                                        Unable to well                                                            visualized with                                                           compression.        +---------+---------------+---------+-----------+----------+-------------------+ PTV                                                   Not visualized      +---------+---------------+---------+-----------+----------+-------------------+ PERO                                                  Not visualized      +---------+---------------+---------+-----------+----------+-------------------+ GSV      Full                    Yes                  vessle wall                                                               thickened           +---------+---------------+---------+-----------+----------+-------------------+    Summary: Left: Technically difficult due to body habitus, study cannot make conclusion due to limitation of vessel visibility.  *See table(s) above for measurements and observations. Electronically signed by Gretta Began MD on 07/16/2018 at 5:50:37 PM.    Final        Subjective:   Discharge Exam: Vitals:   07/19/18 2142 07/20/18 0505  BP:  (!) 156/86  Pulse: 85 79  Resp: 20 20  Temp:  98 F (36.7 C)  SpO2: 95% 96%   Vitals:   07/19/18 1425 07/19/18 2141 07/19/18 2142 07/20/18 0505  BP: (!) 157/77 (!) 162/83  (!) 156/86  Pulse: 92 82 85  79  Resp: 20 (!) 22 20 20   Temp: (!) 97.5 F (36.4 C) 98.9 F (37.2 C)  98 F (36.7 C)  TempSrc: Oral     SpO2: 100% 97% 95% 96%  Weight:      Height:       General exam: Appears calm and comfortable  Respiratory system: Anterior lung sounds clear, currently on CPAP. Cardiovascular system: Distant heart sounds, regular rate and rhythm, no murmurs Gastrointestinal system: Abdomen is nondistended, soft and nontender. No organomegaly or masses felt. Normal bowel  sounds heard. Central nervous system: Grossly intact, moving all extremities Extremities: Bilateral lower extremity edema, left lower extremity wrapped Skin: Left lower extremity wrapped, circumferential erythema decreasing from marked area Psychiatry: Judgement and insight appear normal. Mood & affect appropriate  The results of significant diagnostics from this hospitalization (including imaging, microbiology, ancillary and laboratory) are listed below for reference.     Microbiology: Recent Results (from the past 240 hour(s))  Culture, blood (x 2)     Status: None (Preliminary result)   Collection Time: 07/15/18  5:26 AM  Result Value Ref Range Status   Specimen Description BLOOD RIGHT ANTECUBITAL  Final   Special Requests   Final    BOTTLES DRAWN AEROBIC AND ANAEROBIC Blood Culture adequate volume Performed at [facility], [address] [place], [place] [address]    Culture NO GROWTH 4 DAYS  Final   Report Status PENDING  Incomplete  Culture, blood (x 2)     Status: None (Preliminary result)   Collection Time: 07/15/18  5:31 AM  Result Value Ref Range Status   Specimen Description BLOOD RIGHT UPPER ARM  Final   Special Requests   Final    BOTTLES DRAWN AEROBIC AND ANAEROBIC Blood Culture results may not be optimal due to an excessive volume of blood received in culture bottles Performed at [facility], [address] [place], [place] [address]    Culture NO GROWTH 4 DAYS  Final   Report Status PENDING  Incomplete  MRSA PCR Screening     Status: None   Collection Time: 07/17/18  3:51 PM  Result Value Ref Range Status   MRSA by PCR NEGATIVE NEGATIVE Final    Comment:        The GeneXpert MRSA Assay (FDA approved for NASAL specimens only), is one component of a comprehensive MRSA colonization surveillance program. It is not intended to diagnose MRSA infection nor to guide or monitor treatment for MRSA infections. Performed at  [facility], [address] [place], [place] [address]      Labs: BNP (last 3 results) Recent Labs    12/08/17 1647  BNP 19.7   Basic Metabolic Panel: Recent Labs  Lab 07/16/18 0607 07/17/18 0637 07/18/18 0742 07/19/18 0614 07/20/18 0546  NA 137 134* 139 141 141  K 4.4 4.3 4.4 3.7 3.7  CL 99 92* 94* 90* 88*  CO2 28 33* 38* 39* 44*  GLUCOSE 94 121* 122* 90 110*  BUN 14 17 19 17 18   CREATININE 0.79 0.70 0.64 0.51* 0.66  CALCIUM 8.0* 8.2* 8.5* 8.5* 8.3*  MG  --   --  1.8 1.6* 1.9   Liver Function Tests: Recent Labs  Lab 07/15/18 0341  AST 23  ALT 19  ALKPHOS 74  BILITOT 1.3*  PROT 6.5  ALBUMIN 3.0*   No results for input(s): LIPASE, AMYLASE in the last 168 hours. No results for input(s): AMMONIA in the last  168 hours. CBC: Recent Labs  Lab 07/15/18 0341 07/16/18 0607 07/17/18 0637 07/18/18 0742 07/19/18 0614 07/20/18 0546  WBC 29.7* 26.7* 16.0* 13.3* 12.6* 10.4  NEUTROABS 27.4* 23.6*  --  10.6* 9.4*  --   HGB 14.9 13.9 13.6 13.4 14.1 14.0  HCT 49.2 49.2 48.2 47.8 49.7 48.4  MCV 86.9 93.9 92.3 92.5 91.4 90.1  PLT 166 118* 107* 118* 128* 172   Cardiac Enzymes: No results for input(s): CKTOTAL, CKMB, CKMBINDEX, TROPONINI in the last 168 hours. BNP: Invalid input(s): POCBNP CBG: Recent Labs  Lab 07/18/18 0804 07/18/18 1206 07/19/18 0754 07/20/18 0516 07/20/18 0728  GLUCAP 117* 122* 90 93 144*   D-Dimer No results for input(s): DDIMER in the last 72 hours. Hgb A1c No results for input(s): HGBA1C in the last 72 hours. Lipid Profile No results for input(s): CHOL, HDL, LDLCALC, TRIG, CHOLHDL, LDLDIRECT in the last 72 hours. Thyroid function studies No results for input(s): TSH, T4TOTAL, T3FREE, THYROIDAB in the last 72 hours.  Invalid input(s): FREET3 Anemia work up No results for input(s): VITAMINB12, FOLATE, FERRITIN, TIBC, IRON, RETICCTPCT in the last 72 hours. Urinalysis    Component Value Date/Time   COLORURINE  YELLOW 07/15/2018 0532   APPEARANCEUR CLEAR 07/15/2018 0532   LABSPEC 1.008 07/15/2018 0532   PHURINE 5.0 07/15/2018 0532   GLUCOSEU NEGATIVE 07/15/2018 0532   HGBUR MODERATE (A) 07/15/2018 0532   BILIRUBINUR NEGATIVE 07/15/2018 0532   KETONESUR NEGATIVE 07/15/2018 0532   PROTEINUR 30 (A) 07/15/2018 0532   UROBILINOGEN 2.0 (H) 06/18/2011 1055   NITRITE NEGATIVE 07/15/2018 0532   LEUKOCYTESUR NEGATIVE 07/15/2018 0532   Sepsis Labs Invalid input(s): PROCALCITONIN,  WBC,  LACTICIDVEN Microbiology Recent Results (from the past 240 hour(s))  Culture, blood (x 2)     Status: None (Preliminary result)   Collection Time: 07/15/18  5:26 AM  Result Value Ref Range Status   Specimen Description BLOOD RIGHT ANTECUBITAL  Final   Special Requests   Final    BOTTLES DRAWN AEROBIC AND ANAEROBIC Blood Culture adequate volume Performed at [facility], [address] [place], [place] [address]    Culture NO GROWTH 4 DAYS  Final   Report Status PENDING  Incomplete  Culture, blood (x 2)     Status: None (Preliminary result)   Collection Time: 07/15/18  5:31 AM  Result Value Ref Range Status   Specimen Description BLOOD RIGHT UPPER ARM  Final   Special Requests   Final    BOTTLES DRAWN AEROBIC AND ANAEROBIC Blood Culture results may not be optimal due to an excessive volume of blood received in culture bottles Performed at [facility], [address] [place], [place] [address]    Culture NO GROWTH 4 DAYS  Final   Report Status PENDING  Incomplete  MRSA PCR Screening     Status: None   Collection Time: 07/17/18  3:51 PM  Result Value Ref Range Status   MRSA by PCR NEGATIVE NEGATIVE Final    Comment:        The GeneXpert MRSA Assay (FDA approved for NASAL specimens only), is one component of a comprehensive MRSA colonization surveillance program. It is not intended to diagnose MRSA infection nor to guide or monitor treatment for MRSA  infections. Performed at [facility], [address] [place], [place] [address]      Time coordinating discharge: 1835  SIGNED:   Delaine LameShrey C [place], MD  [facility] 07/20/2018, 11:32 AM  If  7PM-7AM, please contact night-coverage [email or web address] Password [email or web address]

## 2018-07-20 NOTE — Progress Notes (Signed)
PT Cancellation Note  Patient Details Name: MARQUETT FERRANDO MRN: [id number] DOB: [birth year]   Cancelled Treatment:    Reason Eval/Treat Not Completed: Other (comment)patient  Reports that he is to Dc and requesting a WC. Mobility has been kimited due to LE edema and pain.    Sharen Heck PT Acute Rehabilitation Services Pager [phone] Office [phone]  07/20/2018, 12:16 PM

## 2018-07-20 NOTE — Progress Notes (Signed)
    Durable Medical Equipment  (From admission, onward)         Start     Ordered   07/20/18 0000  For home use only DME standard manual wheelchair with seat cushion    Comments:  Patient suffers from lower extremity cellulitis and edema which impairs their ability to perform daily activities like bathing, dressing and grooming in the home.  A walker will not resolve  issue with performing activities of daily living. A wheelchair will allow patient to safely perform daily activities. Patient can safely propel the wheelchair in the home or has a caregiver who can provide assistance.  Accessories: elevating leg rests (ELRs), wheel locks, extensions and anti-tippers.   07/20/18 1132

## 2018-08-27 ENCOUNTER — Emergency Department (HOSPITAL_COMMUNITY): Payer: Medicare Other

## 2018-08-27 ENCOUNTER — Inpatient Hospital Stay (HOSPITAL_COMMUNITY)
Admission: EM | Admit: 2018-08-27 | Discharge: 2018-09-13 | DRG: 207 | Disposition: E | Payer: Medicare Other | Attending: Pulmonary Disease | Admitting: Pulmonary Disease

## 2018-08-27 ENCOUNTER — Encounter (HOSPITAL_COMMUNITY): Payer: Self-pay

## 2018-08-27 ENCOUNTER — Other Ambulatory Visit: Payer: Self-pay

## 2018-08-27 DIAGNOSIS — R69 Illness, unspecified: Secondary | ICD-10-CM

## 2018-08-27 DIAGNOSIS — R0902 Hypoxemia: Secondary | ICD-10-CM

## 2018-08-27 DIAGNOSIS — F17210 Nicotine dependence, cigarettes, uncomplicated: Secondary | ICD-10-CM | POA: Diagnosis present

## 2018-08-27 DIAGNOSIS — Z978 Presence of other specified devices: Secondary | ICD-10-CM

## 2018-08-27 DIAGNOSIS — Z4659 Encounter for fitting and adjustment of other gastrointestinal appliance and device: Secondary | ICD-10-CM

## 2018-08-27 DIAGNOSIS — Z452 Encounter for adjustment and management of vascular access device: Secondary | ICD-10-CM

## 2018-08-27 DIAGNOSIS — G9340 Encephalopathy, unspecified: Secondary | ICD-10-CM | POA: Diagnosis not present

## 2018-08-27 DIAGNOSIS — J9601 Acute respiratory failure with hypoxia: Secondary | ICD-10-CM

## 2018-08-27 DIAGNOSIS — I890 Lymphedema, not elsewhere classified: Secondary | ICD-10-CM

## 2018-08-27 DIAGNOSIS — R32 Unspecified urinary incontinence: Secondary | ICD-10-CM | POA: Diagnosis not present

## 2018-08-27 DIAGNOSIS — E871 Hypo-osmolality and hyponatremia: Secondary | ICD-10-CM | POA: Diagnosis not present

## 2018-08-27 DIAGNOSIS — I10 Essential (primary) hypertension: Secondary | ICD-10-CM | POA: Diagnosis present

## 2018-08-27 DIAGNOSIS — Z6841 Body Mass Index (BMI) 40.0 and over, adult: Secondary | ICD-10-CM

## 2018-08-27 DIAGNOSIS — I2699 Other pulmonary embolism without acute cor pulmonale: Secondary | ICD-10-CM | POA: Diagnosis not present

## 2018-08-27 DIAGNOSIS — J9621 Acute and chronic respiratory failure with hypoxia: Secondary | ICD-10-CM | POA: Diagnosis present

## 2018-08-27 DIAGNOSIS — E875 Hyperkalemia: Secondary | ICD-10-CM | POA: Diagnosis not present

## 2018-08-27 DIAGNOSIS — I878 Other specified disorders of veins: Secondary | ICD-10-CM | POA: Diagnosis present

## 2018-08-27 DIAGNOSIS — I4901 Ventricular fibrillation: Secondary | ICD-10-CM | POA: Diagnosis not present

## 2018-08-27 DIAGNOSIS — Z9911 Dependence on respirator [ventilator] status: Secondary | ICD-10-CM

## 2018-08-27 DIAGNOSIS — I468 Cardiac arrest due to other underlying condition: Secondary | ICD-10-CM | POA: Diagnosis not present

## 2018-08-27 DIAGNOSIS — Z9289 Personal history of other medical treatment: Secondary | ICD-10-CM

## 2018-08-27 DIAGNOSIS — E662 Morbid (severe) obesity with alveolar hypoventilation: Secondary | ICD-10-CM | POA: Diagnosis present

## 2018-08-27 DIAGNOSIS — E876 Hypokalemia: Secondary | ICD-10-CM | POA: Diagnosis not present

## 2018-08-27 DIAGNOSIS — A419 Sepsis, unspecified organism: Secondary | ICD-10-CM | POA: Diagnosis not present

## 2018-08-27 DIAGNOSIS — Z515 Encounter for palliative care: Secondary | ICD-10-CM | POA: Diagnosis not present

## 2018-08-27 DIAGNOSIS — I5033 Acute on chronic diastolic (congestive) heart failure: Secondary | ICD-10-CM | POA: Diagnosis present

## 2018-08-27 DIAGNOSIS — B369 Superficial mycosis, unspecified: Secondary | ICD-10-CM | POA: Diagnosis not present

## 2018-08-27 DIAGNOSIS — Z66 Do not resuscitate: Secondary | ICD-10-CM | POA: Diagnosis not present

## 2018-08-27 DIAGNOSIS — I110 Hypertensive heart disease with heart failure: Secondary | ICD-10-CM | POA: Diagnosis present

## 2018-08-27 DIAGNOSIS — L03311 Cellulitis of abdominal wall: Secondary | ICD-10-CM

## 2018-08-27 DIAGNOSIS — R6521 Severe sepsis with septic shock: Secondary | ICD-10-CM | POA: Diagnosis not present

## 2018-08-27 DIAGNOSIS — Z8249 Family history of ischemic heart disease and other diseases of the circulatory system: Secondary | ICD-10-CM

## 2018-08-27 DIAGNOSIS — M793 Panniculitis, unspecified: Secondary | ICD-10-CM | POA: Diagnosis present

## 2018-08-27 DIAGNOSIS — Z888 Allergy status to other drugs, medicaments and biological substances status: Secondary | ICD-10-CM

## 2018-08-27 DIAGNOSIS — N5089 Other specified disorders of the male genital organs: Secondary | ICD-10-CM | POA: Diagnosis present

## 2018-08-27 DIAGNOSIS — E874 Mixed disorder of acid-base balance: Secondary | ICD-10-CM | POA: Diagnosis not present

## 2018-08-27 DIAGNOSIS — T884XXA Failed or difficult intubation, initial encounter: Secondary | ICD-10-CM

## 2018-08-27 DIAGNOSIS — Q5564 Hidden penis: Secondary | ICD-10-CM

## 2018-08-27 DIAGNOSIS — N170 Acute kidney failure with tubular necrosis: Secondary | ICD-10-CM | POA: Diagnosis not present

## 2018-08-27 DIAGNOSIS — Z79899 Other long term (current) drug therapy: Secondary | ICD-10-CM

## 2018-08-27 DIAGNOSIS — J9622 Acute and chronic respiratory failure with hypercapnia: Secondary | ICD-10-CM | POA: Diagnosis present

## 2018-08-27 LAB — CBC WITH DIFFERENTIAL/PLATELET
Abs Immature Granulocytes: 0.09 10*3/uL — ABNORMAL HIGH (ref 0.00–0.07)
Basophils Absolute: 0.1 10*3/uL (ref 0.0–0.1)
Basophils Relative: 1 %
Eosinophils Absolute: 0.1 10*3/uL (ref 0.0–0.5)
Eosinophils Relative: 1 %
HCT: 46 % (ref 39.0–52.0)
Hemoglobin: 13.1 g/dL (ref 13.0–17.0)
Immature Granulocytes: 1 %
Lymphocytes Relative: 13 %
Lymphs Abs: 1.3 10*3/uL (ref 0.7–4.0)
MCH: 26.9 pg (ref 26.0–34.0)
MCHC: 28.5 g/dL — ABNORMAL LOW (ref 30.0–36.0)
MCV: 94.5 fL (ref 80.0–100.0)
Monocytes Absolute: 1.7 10*3/uL — ABNORMAL HIGH (ref 0.1–1.0)
Monocytes Relative: 16 %
Neutro Abs: 7.3 10*3/uL (ref 1.7–7.7)
Neutrophils Relative %: 68 %
Platelets: 219 10*3/uL (ref 150–400)
RBC: 4.87 MIL/uL (ref 4.22–5.81)
RDW: 21.4 % — ABNORMAL HIGH (ref 11.5–15.5)
WBC: 10.6 10*3/uL — ABNORMAL HIGH (ref 4.0–10.5)
nRBC: 0.2 % (ref 0.0–0.2)

## 2018-08-27 LAB — COMPREHENSIVE METABOLIC PANEL
ALT: 17 U/L (ref 0–44)
AST: 17 U/L (ref 15–41)
Albumin: 2.7 g/dL — ABNORMAL LOW (ref 3.5–5.0)
Alkaline Phosphatase: 97 U/L (ref 38–126)
Anion gap: 8 (ref 5–15)
BUN: 14 mg/dL (ref 6–20)
CO2: 36 mmol/L — ABNORMAL HIGH (ref 22–32)
Calcium: 8.2 mg/dL — ABNORMAL LOW (ref 8.9–10.3)
Chloride: 94 mmol/L — ABNORMAL LOW (ref 98–111)
Creatinine, Ser: 0.76 mg/dL (ref 0.61–1.24)
GFR calc Af Amer: >60 mL/min (ref >60)
GFR calc non Af Amer: >60 mL/min (ref >60)
Glucose, Bld: 110 mg/dL — ABNORMAL HIGH (ref 70–99)
Potassium: 3.5 mmol/L (ref 3.5–5.1)
Sodium: 138 mmol/L (ref 135–145)
Total Bilirubin: 1.2 mg/dL (ref 0.3–1.2)
Total Protein: 7.6 g/dL (ref 6.5–8.1)

## 2018-08-27 LAB — BRAIN NATRIURETIC PEPTIDE: B Natriuretic Peptide: 235.6 pg/mL — ABNORMAL HIGH (ref 0.0–100.0)

## 2018-08-27 LAB — LACTIC ACID, PLASMA: Lactic Acid, Venous: 1.3 mmol/L (ref 0.5–1.9)

## 2018-08-27 MED ORDER — FUROSEMIDE 10 MG/ML IJ SOLN
60.0000 mg | Freq: Once | INTRAMUSCULAR | Status: AC
  Administered 2018-08-28: 60 mg via INTRAVENOUS
  Filled 2018-08-27: qty 8

## 2018-08-27 MED ORDER — IPRATROPIUM BROMIDE 0.02 % IN SOLN
0.5000 mg | Freq: Once | RESPIRATORY_TRACT | Status: AC
  Administered 2018-08-27: 0.5 mg via RESPIRATORY_TRACT
  Filled 2018-08-27: qty 2.5

## 2018-08-27 MED ORDER — SODIUM CHLORIDE 0.9 % IV SOLN
1.0000 g | Freq: Once | INTRAVENOUS | Status: AC
  Administered 2018-08-27: 1 g via INTRAVENOUS
  Filled 2018-08-27: qty 10

## 2018-08-27 MED ORDER — ALBUTEROL SULFATE (2.5 MG/3ML) 0.083% IN NEBU
5.0000 mg | INHALATION_SOLUTION | Freq: Once | RESPIRATORY_TRACT | Status: AC
  Administered 2018-08-27: 5 mg via RESPIRATORY_TRACT

## 2018-08-27 MED ORDER — VANCOMYCIN HCL IN DEXTROSE 1-5 GM/200ML-% IV SOLN
1000.0000 mg | Freq: Once | INTRAVENOUS | Status: AC
  Administered 2018-08-27: 1000 mg via INTRAVENOUS
  Filled 2018-08-27: qty 200

## 2018-08-27 MED ORDER — ALBUTEROL (5 MG/ML) CONTINUOUS INHALATION SOLN
INHALATION_SOLUTION | RESPIRATORY_TRACT | Status: AC
  Filled 2018-08-27: qty 20

## 2018-08-27 MED ORDER — ALBUTEROL SULFATE (2.5 MG/3ML) 0.083% IN NEBU
5.0000 mg | INHALATION_SOLUTION | Freq: Once | RESPIRATORY_TRACT | Status: AC
  Administered 2018-08-27: 5 mg via RESPIRATORY_TRACT
  Filled 2018-08-27: qty 6

## 2018-08-27 NOTE — ED Notes (Signed)
BLOOD CULTURES X2 COMPLETED

## 2018-08-27 NOTE — ED Notes (Signed)
Bed: JZ79 Expected date:  Expected time:  Means of arrival:  Comments: 5 M SOB on cpap

## 2018-08-27 NOTE — ED Triage Notes (Addendum)
Per EMS, patient coming from home with complaints of shortness of breath that started on Monday and has gotten progressively worse. Patient denies any history of respiratory illnesses, but does wear a biPAP at night for sleep apnea. Patient was also started on lasix for fluid but denies any history of CHF. He currently takes 50 mg of lasix.   Patient history includes CHF, but patient states "if I do then I didn't know it"   Patient SpO2 was 79% on RA when EMS arrived on scene. They placed patient on CPAP and patient SpO2 is 99%. Patient given 10 mg of albuterol and 0.5 of Atrovent in a nebulizer treatment. Does not wear any oxygen at home.   Patient is currently taking antibiotics for cellulitis and does have MRSA on right left leg.

## 2018-08-27 NOTE — ED Notes (Signed)
Vancomycin stopped due to reaction. Patient itching and skin noted to be red.

## 2018-08-27 NOTE — ED Notes (Signed)
Blood draw attempt x2 unsuccessful.  RN notified 

## 2018-08-27 NOTE — ED Provider Notes (Addendum)
[facility]-EMERGENCY DEPT Provider Note   CSN: [id number] Arrival date & time: 08/16/2018  1923     History   Chief Complaint Chief Complaint  Patient presents with  . Shortness of Breath    HPI Andre BridgemanDouglas L [name] is a 38 y.o. male.  HPI Patient has history of recurrent cellulitis and panniculitis with morbid obesity.  He reports that he now has swelling and redness working its way up the right side of his body.  [facility] has [facility] and was started on clindamycin.  Patient reports he has been taking clindamycin for about 7 days but it continues to be red and advancing up his side.  Patient also short of breath.  This is been a recurrent issue with history of congestive heart failure and obesity hypoventilation syndrome.  He reports that seem to have gotten somewhat worse over the past few days.  He has been taking Lasix 50mg  daily with no relief.  No documented fever.  Patient has history of obesity hypoventilation.  He was found to have oxygen saturation of 79% on room air for EMS.  He was placed on CPAP with oxygen saturation 99%.  He was given albuterol 10 mg and Atrovent 0.5 mg on route.  On arrival, patient is alert and appropriate.  He is having a nebulizer treatment but only mild increased work of breathing at arrival time. Past Medical History:  Diagnosis Date  . Anemia    iron deficient  . Cellulitis    lower extremities  . Cellulitis of scrotum    History of  . CHF (congestive heart failure) (HCC)   . History of acute bronchitis with bronchospasm   . Hypertension   . Morbid obesity (HCC)   . Obesity hypoventilation syndrome (HCC)   . Obstructive sleep apnea   . Panniculitis    History of  . Sleep apnea     Patient Active Problem List   Diagnosis Date Noted  . Acute on chronic respiratory failure with hypoxia (HCC)   . Sepsis due to cellulitis (HCC) 07/15/2018  . Acute on chronic diastolic CHF (congestive heart failure) (HCC)  07/15/2018  . Hypokalemia 07/15/2018  . Cellulitis of scrotum 01/23/2018  . Cellulitis of abdominal wall 01/23/2018  . Leukocytosis   . Cellulitis of left leg 01/13/2015  . Cellulitis 01/13/2015  . TOBACCO ABUSE 03/23/2009  . EDEMA 03/23/2009  . Morbid obesity with body mass index of 70 and over in adult [facility](HCC) 11/12/2008  . OBESITY HYPOVENTILATION SYNDROME 11/12/2008  . Obstructive sleep apnea 11/12/2008  . Essential hypertension 11/12/2008  . CELLULITIS, LEG, LEFT 11/12/2008    History reviewed. No pertinent surgical history.      Home Medications    Prior to Admission medications   Medication Sig Start Date End Date Taking? Authorizing Provider  clindamycin (CLEOCIN) 150 MG capsule Take 300 mg by mouth 4 (four) times daily. Take 300mg  by mouth four times daily 08/19/18  Yes [provider]  furosemide (LASIX) 40 MG tablet Take 1 tablet (40 mg total) by mouth daily. 06/23/18  Yes Antony MaduraHumes, Kelly, PA-C  ibuprofen (ADVIL,MOTRIN) 200 MG tablet Take 600 mg by mouth every 6 (six) hours as needed for moderate pain.   Yes [provider]  lisinopril (PRINIVIL,ZESTRIL) 5 MG tablet Take 1 tablet (5 mg total) by mouth daily. 06/23/18  Yes Antony MaduraHumes, Kelly, PA-C  albuterol (PROVENTIL HFA;VENTOLIN HFA) 108 (90 Base) MCG/ACT inhaler Inhale 2 puffs into the lungs every 6 (six)  hours as needed for wheezing or shortness of breath. Patient not taking: Reported on 06/13/2018 01/25/18   Briant Cedar, MD  amLODipine (NORVASC) 10 MG tablet Take 1 tablet (10 mg total) by mouth daily. Patient not taking: Reported on 06/22/2018 01/25/18 02/24/18  Briant Cedar, MD  pregabalin (LYRICA) 25 MG capsule Take 25 mg by mouth 3 (three) times daily. 08/05/18   [provider]    Family History Family History  Problem Relation Age of Onset  . Asthma Mother   . Cancer Father   . Hypertension Other     Social History Social History   Tobacco Use  . Smoking status: Current  Every Day Smoker    Packs/day: 1.00    Years: 9.00    Pack years: 9.00  . Smokeless tobacco: Never Used  Substance Use Topics  . Alcohol use: Yes  . Drug use: No     Allergies   Other   Review of Systems Review of Systems 10 Systems reviewed and are negative for acute change except as noted in the HPI.   Physical Exam Updated Vital Signs BP (!) 145/90   Pulse 98   Resp 14   Ht 5\' 10"  (1.778 m)   Wt (!) 226.8 kg   SpO2 (!) 87%   BMI 71.74 kg/m   Physical Exam Constitutional:      Comments: Patient is alert and nontoxic.  Mild increased work of breathing.  Patient is currently getting a nebulizer treatment.obesity  HENT:     Head: Normocephalic and atraumatic.     Mouth/Throat:     Mouth: Mucous membranes are moist.  Eyes:     Extraocular Movements: Extraocular movements intact.  Cardiovascular:     Comments: Borderline tachycardia.  No gross rub murmur gallop. Pulmonary:     Comments: Patient has expiratory wheeze scattered bilaterally.  Slight crackle at bases. Abdominal:     Comments: Abdomen is morbidly obese.  Diffuse blanching erythema over the lateral abdomen to the level of the nipple line.  This has a well circumscribed edge.  Patient is lower pannus is more indurated and appears chronically hyperpigmented.  Genitourinary:    Comments: Large scrotal edema with very chronic brawny indurated appearance consistent with that of the lower abdomen. Musculoskeletal:     Comments: Dorsalis pedis pulses 2+ and symmetric.  Patient has many chronic varicosities and diffuse erythema.  Appears more chronic.  Patient has a plaque of brawny induration in the left lower leg with some drying eschar.  Skin:    General: Skin is warm and dry.  Neurological:     General: No focal deficit present.     Mental Status: He is oriented to person, place, and time.     Comments: Patient's movements essentially limited by body habitus but no focal motor deficits.  Normal upper  extremity use.  Psychiatric:        Mood and Affect: Mood normal.      ED Treatments / Results  Labs (all labs ordered are listed, but only abnormal results are displayed) Labs Reviewed  CULTURE, BLOOD (ROUTINE X 2)  CULTURE, BLOOD (ROUTINE X 2)  COMPREHENSIVE METABOLIC PANEL  BRAIN NATRIURETIC PEPTIDE  CBC WITH DIFFERENTIAL/PLATELET  LACTIC ACID, PLASMA  LACTIC ACID, PLASMA  URINALYSIS, ROUTINE W REFLEX MICROSCOPIC    EKG None  Radiology Dg Chest Port 1 View  Result Date: 09/11/2018 CLINICAL DATA:  Dyspnea starting on Monday and progressively worsening. EXAM: PORTABLE CHEST 1 VIEW COMPARISON:  07/15/2018 FINDINGS: Stable cardiomegaly with mild diffuse interstitial edema. Nonaneurysmal appearing thoracic aorta. No alveolar consolidation to suggest pneumonia. The left costophrenic angle is excluded on this study. Definite effusion or pneumothorax. No acute osseous abnormality. IMPRESSION: Cardiomegaly with mild interstitial edema. Electronically Signed   By: Tollie Eth M.D.   On: 09/12/2018 20:33    Procedures Procedures (including critical care time)  Medications Ordered in ED Medications  albuterol (PROVENTIL, VENTOLIN) (5 MG/ML) 0.5% continuous inhalation solution (  Not Given Sep 12, 2018 1947)  albuterol (PROVENTIL) (2.5 MG/3ML) 0.083% nebulizer solution 5 mg (5 mg Nebulization Given September 12, 2018 1946)     Initial Impression / Assessment and Plan / ED Course  I have reviewed the triage vital signs and the nursing notes.  Pertinent labs & imaging results that were available during my care of the patient were reviewed by me and considered in my medical decision making (see chart for details).     Consult:Dr Onalee Hua for admission.  Patient has expanding erythema of the side of the body wall.  This appears to be a cellulitis stemming from chronic area of induration and panniculitis of the lower aspect of the abdomen.  Patient has severe lymphedema of his lower abdomen in the  legs.  This has been a chronic and recurrent problem.  He however is been getting now rebounding episodes of cellulitis and had MRSA of the left lower extremity.  At this time now IV has been established by nursing staff and blood cultures obtained.  Patient has improved in terms of respiratory status with nebulizer treatment.  He has not required further respiratory support.  We will start antibiotics for cellulitis of the body wall.  Patient has been on clindamycin on outpatient basis.  Will start vancomycin and Rocephin.  Plan for admission.  Final Clinical Impressions(s) / ED Diagnoses   Final diagnoses:  Cellulitis of abdominal wall  Severe comorbid illness    ED Discharge Orders    None       Arby Barrette, MD Sep 12, 2018 2229    Arby Barrette, MD 08/28/18 0022

## 2018-08-27 NOTE — Progress Notes (Signed)
RT placed patient on CPAP on home setting of auto 10-20 cmH2O. Patients sats were 82% with 6 liter bleed in. Patients girlfriend stated that regardless of how much oxygen he is own his oxygen level is in the low 80's. When he is laying down his sats are in the 70's. RT placed patient on Bipap via V60 to keep oxygen in the 90's. Bipap settings are 16/6, O2 is 35% and a rate of 12, His oxygen is now 98%. RT will continue to monitor.

## 2018-08-28 ENCOUNTER — Encounter (HOSPITAL_COMMUNITY): Payer: Self-pay | Admitting: Family Medicine

## 2018-08-28 DIAGNOSIS — J80 Acute respiratory distress syndrome: Secondary | ICD-10-CM | POA: Diagnosis not present

## 2018-08-28 DIAGNOSIS — E875 Hyperkalemia: Secondary | ICD-10-CM | POA: Diagnosis not present

## 2018-08-28 DIAGNOSIS — N5089 Other specified disorders of the male genital organs: Secondary | ICD-10-CM | POA: Diagnosis present

## 2018-08-28 DIAGNOSIS — Z6841 Body Mass Index (BMI) 40.0 and over, adult: Secondary | ICD-10-CM | POA: Diagnosis not present

## 2018-08-28 DIAGNOSIS — J9601 Acute respiratory failure with hypoxia: Secondary | ICD-10-CM | POA: Insufficient documentation

## 2018-08-28 DIAGNOSIS — I468 Cardiac arrest due to other underlying condition: Secondary | ICD-10-CM | POA: Diagnosis not present

## 2018-08-28 DIAGNOSIS — N179 Acute kidney failure, unspecified: Secondary | ICD-10-CM | POA: Diagnosis not present

## 2018-08-28 DIAGNOSIS — I890 Lymphedema, not elsewhere classified: Secondary | ICD-10-CM

## 2018-08-28 DIAGNOSIS — E871 Hypo-osmolality and hyponatremia: Secondary | ICD-10-CM | POA: Diagnosis not present

## 2018-08-28 DIAGNOSIS — I469 Cardiac arrest, cause unspecified: Secondary | ICD-10-CM | POA: Diagnosis not present

## 2018-08-28 DIAGNOSIS — R0989 Other specified symptoms and signs involving the circulatory and respiratory systems: Secondary | ICD-10-CM | POA: Diagnosis not present

## 2018-08-28 DIAGNOSIS — R570 Cardiogenic shock: Secondary | ICD-10-CM | POA: Diagnosis not present

## 2018-08-28 DIAGNOSIS — Z7189 Other specified counseling: Secondary | ICD-10-CM | POA: Diagnosis not present

## 2018-08-28 DIAGNOSIS — Z9911 Dependence on respirator [ventilator] status: Secondary | ICD-10-CM | POA: Diagnosis not present

## 2018-08-28 DIAGNOSIS — R69 Illness, unspecified: Secondary | ICD-10-CM | POA: Diagnosis not present

## 2018-08-28 DIAGNOSIS — G9340 Encephalopathy, unspecified: Secondary | ICD-10-CM | POA: Diagnosis not present

## 2018-08-28 DIAGNOSIS — L03311 Cellulitis of abdominal wall: Secondary | ICD-10-CM | POA: Diagnosis present

## 2018-08-28 DIAGNOSIS — E874 Mixed disorder of acid-base balance: Secondary | ICD-10-CM | POA: Diagnosis not present

## 2018-08-28 DIAGNOSIS — J9621 Acute and chronic respiratory failure with hypoxia: Secondary | ICD-10-CM | POA: Diagnosis present

## 2018-08-28 DIAGNOSIS — I110 Hypertensive heart disease with heart failure: Secondary | ICD-10-CM | POA: Diagnosis present

## 2018-08-28 DIAGNOSIS — I5033 Acute on chronic diastolic (congestive) heart failure: Secondary | ICD-10-CM | POA: Diagnosis present

## 2018-08-28 DIAGNOSIS — I10 Essential (primary) hypertension: Secondary | ICD-10-CM | POA: Diagnosis not present

## 2018-08-28 DIAGNOSIS — R6521 Severe sepsis with septic shock: Secondary | ICD-10-CM | POA: Diagnosis not present

## 2018-08-28 DIAGNOSIS — E662 Morbid (severe) obesity with alveolar hypoventilation: Secondary | ICD-10-CM | POA: Diagnosis present

## 2018-08-28 DIAGNOSIS — R0602 Shortness of breath: Secondary | ICD-10-CM | POA: Diagnosis not present

## 2018-08-28 DIAGNOSIS — M793 Panniculitis, unspecified: Secondary | ICD-10-CM | POA: Diagnosis present

## 2018-08-28 DIAGNOSIS — Z66 Do not resuscitate: Secondary | ICD-10-CM | POA: Diagnosis not present

## 2018-08-28 DIAGNOSIS — N170 Acute kidney failure with tubular necrosis: Secondary | ICD-10-CM | POA: Diagnosis not present

## 2018-08-28 DIAGNOSIS — Z515 Encounter for palliative care: Secondary | ICD-10-CM | POA: Diagnosis not present

## 2018-08-28 DIAGNOSIS — B369 Superficial mycosis, unspecified: Secondary | ICD-10-CM | POA: Diagnosis not present

## 2018-08-28 DIAGNOSIS — A419 Sepsis, unspecified organism: Secondary | ICD-10-CM | POA: Diagnosis not present

## 2018-08-28 DIAGNOSIS — M7989 Other specified soft tissue disorders: Secondary | ICD-10-CM | POA: Diagnosis not present

## 2018-08-28 DIAGNOSIS — J9622 Acute and chronic respiratory failure with hypercapnia: Secondary | ICD-10-CM | POA: Diagnosis present

## 2018-08-28 DIAGNOSIS — E876 Hypokalemia: Secondary | ICD-10-CM | POA: Diagnosis not present

## 2018-08-28 DIAGNOSIS — I4901 Ventricular fibrillation: Secondary | ICD-10-CM | POA: Diagnosis not present

## 2018-08-28 DIAGNOSIS — I2699 Other pulmonary embolism without acute cor pulmonale: Secondary | ICD-10-CM | POA: Diagnosis not present

## 2018-08-28 LAB — BASIC METABOLIC PANEL
Anion gap: 8 (ref 5–15)
BUN: 13 mg/dL (ref 6–20)
CO2: 36 mmol/L — AB (ref 22–32)
Calcium: 8.1 mg/dL — ABNORMAL LOW (ref 8.9–10.3)
Chloride: 94 mmol/L — ABNORMAL LOW (ref 98–111)
Creatinine, Ser: 0.73 mg/dL (ref 0.61–1.24)
GFR calc Af Amer: >60 mL/min (ref >60)
GFR calc non Af Amer: >60 mL/min (ref >60)
Glucose, Bld: 112 mg/dL — ABNORMAL HIGH (ref 70–99)
Potassium: 3.6 mmol/L (ref 3.5–5.1)
Sodium: 138 mmol/L (ref 135–145)

## 2018-08-28 LAB — CBC
HCT: 48.2 % (ref 39.0–52.0)
Hemoglobin: 13.3 g/dL (ref 13.0–17.0)
MCH: 25.8 pg — ABNORMAL LOW (ref 26.0–34.0)
MCHC: 27.6 g/dL — AB (ref 30.0–36.0)
MCV: 93.4 fL (ref 80.0–100.0)
Platelets: 217 10*3/uL (ref 150–400)
RBC: 5.16 MIL/uL (ref 4.22–5.81)
RDW: 21.3 % — ABNORMAL HIGH (ref 11.5–15.5)
WBC: 9.8 10*3/uL (ref 4.0–10.5)
nRBC: 0.5 % — ABNORMAL HIGH (ref 0.0–0.2)

## 2018-08-28 LAB — URINALYSIS, ROUTINE W REFLEX MICROSCOPIC
Bilirubin Urine: NEGATIVE
Glucose, UA: NEGATIVE mg/dL
Ketones, ur: NEGATIVE mg/dL
Leukocytes,Ua: NEGATIVE
Nitrite: POSITIVE — AB
Protein, ur: 100 mg/dL — AB
Specific Gravity, Urine: 1.017 (ref 1.005–1.030)
pH: 5 (ref 5.0–8.0)

## 2018-08-28 LAB — MRSA PCR SCREENING: MRSA by PCR: NEGATIVE

## 2018-08-28 MED ORDER — LISINOPRIL 5 MG PO TABS
5.0000 mg | ORAL_TABLET | Freq: Every day | ORAL | Status: DC
  Administered 2018-08-28 – 2018-08-30 (×3): 5 mg via ORAL
  Filled 2018-08-28 (×3): qty 2

## 2018-08-28 MED ORDER — PREGABALIN 25 MG PO CAPS
25.0000 mg | ORAL_CAPSULE | Freq: Three times a day (TID) | ORAL | Status: DC
  Administered 2018-08-28 – 2018-08-30 (×8): 25 mg via ORAL
  Filled 2018-08-28 (×8): qty 1

## 2018-08-28 MED ORDER — IBUPROFEN 200 MG PO TABS: 600.0000 mg | ORAL_TABLET | Freq: Four times a day (QID) | ORAL | Status: DC | PRN

## 2018-08-28 MED ORDER — ALBUTEROL SULFATE HFA 108 (90 BASE) MCG/ACT IN AERS: 2.0000 | INHALATION_SPRAY | Freq: Four times a day (QID) | RESPIRATORY_TRACT | Status: DC | PRN

## 2018-08-28 MED ORDER — SODIUM CHLORIDE 0.9% FLUSH
3.0000 mL | Freq: Two times a day (BID) | INTRAVENOUS | Status: DC
  Administered 2018-08-28 (×3): 3 mL via INTRAVENOUS
  Administered 2018-08-29: 10 mL via INTRAVENOUS
  Administered 2018-08-29 – 2018-09-07 (×15): 3 mL via INTRAVENOUS

## 2018-08-28 MED ORDER — ENOXAPARIN SODIUM 100 MG/ML ~~LOC~~ SOLN
100.0000 mg | SUBCUTANEOUS | Status: DC
  Administered 2018-08-28 – 2018-09-01 (×5): 100 mg via SUBCUTANEOUS
  Filled 2018-08-28 (×5): qty 1

## 2018-08-28 MED ORDER — ORAL CARE MOUTH RINSE
15.0000 mL | Freq: Two times a day (BID) | OROMUCOSAL | Status: DC
  Administered 2018-08-28 – 2018-09-01 (×4): 15 mL via OROMUCOSAL

## 2018-08-28 MED ORDER — AMLODIPINE BESYLATE 10 MG PO TABS
10.0000 mg | ORAL_TABLET | Freq: Every day | ORAL | Status: DC
  Administered 2018-08-28 – 2018-08-30 (×3): 10 mg via ORAL
  Filled 2018-08-28 (×3): qty 1

## 2018-08-28 MED ORDER — SODIUM CHLORIDE 0.9 % IV SOLN
250.0000 mL | INTRAVENOUS | Status: DC | PRN
  Administered 2018-08-29 – 2018-09-04 (×3): 250 mL via INTRAVENOUS

## 2018-08-28 MED ORDER — ALBUTEROL SULFATE (2.5 MG/3ML) 0.083% IN NEBU
2.5000 mg | INHALATION_SOLUTION | Freq: Four times a day (QID) | RESPIRATORY_TRACT | Status: DC | PRN
  Administered 2018-08-29 – 2018-09-04 (×2): 2.5 mg via RESPIRATORY_TRACT
  Filled 2018-08-28 (×2): qty 3

## 2018-08-28 MED ORDER — FUROSEMIDE 10 MG/ML IJ SOLN
40.0000 mg | Freq: Two times a day (BID) | INTRAMUSCULAR | Status: DC
  Administered 2018-08-28 – 2018-08-29 (×3): 40 mg via INTRAVENOUS
  Filled 2018-08-28 (×3): qty 4

## 2018-08-28 MED ORDER — SODIUM CHLORIDE 0.9% FLUSH: 3.0000 mL | INTRAVENOUS | Status: DC | PRN

## 2018-08-28 MED ORDER — CHLORHEXIDINE GLUCONATE 0.12 % MT SOLN
15.0000 mL | Freq: Two times a day (BID) | OROMUCOSAL | Status: DC
  Administered 2018-08-28 – 2018-09-08 (×20): 15 mL via OROMUCOSAL
  Filled 2018-08-28 (×9): qty 15

## 2018-08-28 MED ORDER — CLINDAMYCIN HCL 300 MG PO CAPS
300.0000 mg | ORAL_CAPSULE | Freq: Four times a day (QID) | ORAL | Status: DC
  Administered 2018-08-28 – 2018-08-30 (×10): 300 mg via ORAL
  Filled 2018-08-28 (×11): qty 1

## 2018-08-28 NOTE — ED Notes (Signed)
ED TO INPATIENT HANDOFF REPORT  Name/Age/Gender Andre [name] 38 y.o. male  Code Status Code Status History    Date Active Date Inactive Code Status Order ID Comments User Context   07/15/2018 0532 07/20/2018 1604 Full Code [id number]  Briscoe Deutscher, MD ED   01/23/2018 0454 01/25/2018 2012 Full Code [id number]  Eduard Clos, MD Inpatient   01/13/2015 2322 01/16/2015 1627 Full Code [id number]  Ron Parker, MD Inpatient      Home/SNF/Other Home  Chief Complaint shortness of breath  Level of Care/Admitting Diagnosis ED Disposition    ED Disposition Condition Comment   Admit  Hospital Area: [facility] [[id number]]  Level of Care: Stepdown [14]  Admit to SDU based on following criteria: Respiratory Distress:  Frequent assessment and/or intervention to maintain adequate ventilation/respiration, pulmonary toilet, and respiratory treatment.  Diagnosis: Acute respiratory failure with hypoxia [facility]) [[id number]]  Admitting Physician: Haydee Monica [[id number]]  Attending Physician: Tarry Kos A [[id number]]  Estimated length of stay: past midnight tomorrow  Certification:: I certify this patient will need inpatient services for at least 2 midnights  PT Class (Do Not Modify): Inpatient [101]  PT Acc Code (Do Not Modify): Private [1]       Medical History Past Medical History:  Diagnosis Date  . Anemia    iron deficient  . Cellulitis    lower extremities  . Cellulitis of scrotum    History of  . CHF (congestive heart failure) (HCC)   . History of acute bronchitis with bronchospasm   . Hypertension   . Morbid obesity (HCC)   . Obesity hypoventilation syndrome (HCC)   . Obstructive sleep apnea   . Panniculitis    History of  . Sleep apnea     Allergies Allergies  Allergen Reactions  . Other Hives    Steroid, but cannot remember name.    IV Location/Drains/Wounds Patient Lines/Drains/Airways Status   Active Line/Drains/Airways    Name:   Placement  date:   Placement time:   Site:   Days:   Peripheral IV Sep 03, 2018 Left Hand   2018-09-03    2225    Hand   1   Peripheral IV 2018/09/03 Right Hand   2018-09-03    2225    Hand   1          Labs/Imaging Results for orders placed or performed during the hospital encounter of 2018/09/03 (from the past 48 hour(s))  Comprehensive metabolic panel     Status: Abnormal   Collection Time: 03-Sep-2018 10:29 PM  Result Value Ref Range   Sodium 138 135 - 145 mmol/L   Potassium 3.5 3.5 - 5.1 mmol/L   Chloride 94 (L) 98 - 111 mmol/L   CO2 36 (H) 22 - 32 mmol/L   Glucose, Bld 110 (H) 70 - 99 mg/dL   BUN 14 6 - 20 mg/dL   Creatinine, Ser 3.20 0.61 - 1.24 mg/dL   Calcium 8.2 (L) 8.9 - 10.3 mg/dL   Total Protein 7.6 6.5 - 8.1 g/dL   Albumin 2.7 (L) 3.5 - 5.0 g/dL   AST 17 15 - 41 U/L   ALT 17 0 - 44 U/L   Alkaline Phosphatase 97 38 - 126 U/L   Total Bilirubin 1.2 0.3 - 1.2 mg/dL   GFR calc non Af Amer >60 >60 mL/min   GFR calc Af Amer >60 >60 mL/min   Anion gap 8 5 - 15    Comment: Performed at [facility], [address] [place], [place] [address]  Brain natriuretic peptide     Status: Abnormal   Collection Time: 08/28/2018 10:29 PM  Result Value Ref Range   B Natriuretic Peptide 235.6 (H) 0.0 - 100.0 pg/mL    Comment: Performed at [facility], [address] [place], [place] [address]  CBC with Differential     Status: Abnormal   Collection Time: 09/06/2018 10:29 PM  Result Value Ref Range   WBC 10.6 (H) 4.0 - 10.5 K/uL   RBC 4.87 4.22 - 5.81 MIL/uL   Hemoglobin 13.1 13.0 - 17.0 g/dL   HCT 86.546.0 78.439.0 - 69.652.0 %   MCV 94.5 80.0 - 100.0 fL   MCH 26.9 26.0 - 34.0 pg   MCHC 28.5 (L) 30.0 - 36.0 g/dL   RDW 29.521.4 (H) 28.411.5 - 13.215.5 %   Platelets 219 150 - 400 K/uL   nRBC 0.2 0.0 - 0.2 %   Neutrophils Relative % 68 %    Comment: VACUOLATED NEUTROPHILS   Neutro Abs 7.3 1.7 - 7.7 K/uL   Lymphocytes Relative 13 %   Lymphs Abs 1.3 0.7 - 4.0 K/uL   Monocytes Relative 16 %    Monocytes Absolute 1.7 (H) 0.1 - 1.0 K/uL   Eosinophils Relative 1 %   Eosinophils Absolute 0.1 0.0 - 0.5 K/uL   Basophils Relative 1 %   Basophils Absolute 0.1 0.0 - 0.1 K/uL   Immature Granulocytes 1 %   Abs Immature Granulocytes 0.09 (H) 0.00 - 0.07 K/uL   Polychromasia PRESENT     Comment: Performed at [facility], [address] [place], [place] [address]  Lactic acid, plasma     Status: None   Collection Time: 08/26/2018 10:29 PM  Result Value Ref Range   Lactic Acid, Venous 1.3 0.5 - 1.9 mmol/L    Comment: Performed at [facility], [address] [place], [place] [address]   Dg Chest Port 1 View  Result Date: 08/16/2018 CLINICAL DATA:  Dyspnea starting on Monday and progressively worsening. EXAM: PORTABLE CHEST 1 VIEW COMPARISON:  07/15/2018 FINDINGS: Stable cardiomegaly with mild diffuse interstitial edema. Nonaneurysmal appearing thoracic aorta. No alveolar consolidation to suggest pneumonia. The left costophrenic angle is excluded on this study. Definite effusion or pneumothorax. No acute osseous abnormality. IMPRESSION: Cardiomegaly with mild interstitial edema. Electronically Signed   By: Tollie Ethavid  Kwon M.D.   On: 08/18/2018 20:33    Pending Labs Unresulted Labs (From admission, onward)    Start     Ordered   09/05/2018 2007  Urinalysis, Routine w reflex microscopic  ONCE - STAT,   STAT     08/25/2018 2006   08/30/2018 2007  Culture, blood (routine x 2)  BLOOD CULTURE X 2,   STAT     09/07/2018 2006          Vitals/Pain Today's Vitals   08/20/2018 2329 08/16/2018 2330 08/28/18 0000 08/28/18 0030  BP:  132/77 (!) 143/75 (!) 165/92  Pulse: 95 95 92 98  Resp: 17 (!) 22 20 19   SpO2: 97% 96% 97% 95%  Weight:      Height:      PainSc:  0-No pain      Isolation Precautions No active isolations  Medications Medications  albuterol (PROVENTIL, VENTOLIN) (5 MG/ML) 0.5% continuous inhalation solution (  Not Given 09/01/2018 1947)  albuterol  (PROVENTIL) (2.5 MG/3ML) 0.083% nebulizer solution 5 mg (5 mg Nebulization Given 09/10/2018 1946)  albuterol (PROVENTIL) (2.5 MG/3ML) 0.083% nebulizer solution 5 mg (5 mg Nebulization Given 09/04/2018 2234)  ipratropium (ATROVENT) nebulizer solution 0.5 mg (0.5 mg Nebulization Given 08/18/2018 2234)  vancomycin (VANCOCIN) IVPB 1000 mg/200 mL premix ( Intravenous Stopped 08/17/2018 2350)  cefTRIAXone (ROCEPHIN) 1 g in sodium chloride 0.9 % 100 mL IVPB (0 g Intravenous Stopped 08/28/2018 2319)  furosemide (LASIX) injection 60 mg (60 mg Intravenous Given 08/28/18 0002)    Mobility walks with person assist

## 2018-08-28 NOTE — H&P (Signed)
History and Physical    [name]:[id number] DOB: Jul 17, 1980 DOA: 09/07/2018  PCP: Annita Brod, MD  Patient coming from: Home  Chief Complaint: Shortness of breath  HPI: Andre [name] is a 38 y.o. male with medical history significant of morbid obesity, chronic lymphedema and panniculitis, diastolic congestive heart failure, obstructive sleep apnea and hypo-ventilation syndrome requiring CPAP nightly, hypertension comes in because of shortness of breath.  Patient is compliant with his medications.  He was started several days ago on clindamycin for presumptive cellulitis.  His girlfriend states that his swelling is no worse than normal and she reports no change in the chronic changes and redness to his legs.  His scrotum is always swollen.  He does not weigh himself at home.  He has not had any fevers.  Comes in with shortness of breath but they do feel he is more swollen than normal.  He has not had any URI symptoms or any respiratory symptoms except shortness of breath.  He has chronic lymphedema particularly in his left leg and in his panniculus and scrotum.  Patient is being referred for admission for acute on proximal respiratory failure he was initially oxygen sats of 80% on arrival.  Review of Systems: As per HPI otherwise 10 point review of systems negative.   Past Medical History:  Diagnosis Date  . Anemia    iron deficient  . Cellulitis    lower extremities  . Cellulitis of scrotum    History of  . CHF (congestive heart failure) (HCC)   . History of acute bronchitis with bronchospasm   . Hypertension   . Morbid obesity (HCC)   . Obesity hypoventilation syndrome (HCC)   . Obstructive sleep apnea   . Panniculitis    History of  . Sleep apnea     History reviewed. No pertinent surgical history.   reports that he has been smoking. He has a 9.00 pack-year smoking history. He has never used smokeless tobacco. He reports current alcohol use. He reports that  he does not use drugs.  Allergies  Allergen Reactions  . Other Hives    Steroid, but cannot remember name.    Family History  Problem Relation Age of Onset  . Asthma Mother   . Cancer Father   . Hypertension Other     Prior to Admission medications   Medication Sig Start Date End Date Taking? Authorizing Provider  clindamycin (CLEOCIN) 150 MG capsule Take 300 mg by mouth 4 (four) times daily. Take 300mg  by mouth four times daily 08/19/18  Yes [provider]  furosemide (LASIX) 40 MG tablet Take 1 tablet (40 mg total) by mouth daily. 06/23/18  Yes Antony Madura, PA-C  ibuprofen (ADVIL,MOTRIN) 200 MG tablet Take 600 mg by mouth every 6 (six) hours as needed for moderate pain.   Yes [provider]  lisinopril (PRINIVIL,ZESTRIL) 5 MG tablet Take 1 tablet (5 mg total) by mouth daily. 06/23/18  Yes Antony Madura, PA-C  albuterol (PROVENTIL HFA;VENTOLIN HFA) 108 (90 Base) MCG/ACT inhaler Inhale 2 puffs into the lungs every 6 (six) hours as needed for wheezing or shortness of breath. Patient not taking: Reported on 06/13/2018 01/25/18   Briant Cedar, MD  amLODipine (NORVASC) 10 MG tablet Take 1 tablet (10 mg total) by mouth daily. Patient not taking: Reported on 06/22/2018 01/25/18 02/24/18  Briant Cedar, MD  pregabalin (LYRICA) 25 MG capsule Take 25 mg by mouth 3 (three) times daily. 08/05/18   [provider]    Physical Exam: Vitals:   Jun 01, 2019 2300 Jun 01, 2019 2329 Jun 01, 2019 2330 08/28/18 0000  BP: (!) 141/87  132/77 (!) 143/75  Pulse: 78 95 95 92  Resp: (!) 21 17 (!) 22 20  SpO2: 93% 97% 96% 97%  Weight:      Height:          Constitutional: NAD, calm, comfortable on CPAP morbidly obese Vitals:   Jun 01, 2019 2300 Jun 01, 2019 2329 Jun 01, 2019 2330 08/28/18 0000  BP: (!) 141/87  132/77 (!) 143/75  Pulse: 78 95 95 92  Resp: (!) 21 17 (!) 22 20  SpO2: 93% 97% 96% 97%  Weight:      Height:       Eyes: PERRL, lids and conjunctivae normal ENMT: Mucous  membranes are moist. Posterior pharynx clear of any exudate or lesions.Normal dentition.  Neck: normal, supple, no masses, no thyromegaly Respiratory: clear to auscultation bilaterally, expiratory wheezing, no crackles. Normal respiratory effort. No accessory muscle use.  Cardiovascular: Regular rate and rhythm, no murmurs / rubs / gallops. No extremity edema. 2+ pedal pulses. No carotid bruits.  Abdomen: no tenderness, no masses palpated. No hepatosplenomegaly. Bowel sounds positive.  Musculoskeletal: no clubbing / cyanosis. No joint deformity upper and lower extremities. Good ROM, no contractures. Normal muscle tone.  Skin: Diffuse swelling in the pannus and in the lower extremities worse on the left and in the scrotal area all dependent edema is got chronic changes skin is normal to touch not hot neurologic: CN 2-12 grossly intact. Sensation intact, DTR normal. Strength 5/5 in all 4.  Psychiatric: Normal judgment and insight. Alert and oriented x 3. Normal mood.    Labs on Admission: I have personally reviewed following labs and imaging studies  CBC: Recent Labs  Lab Jun 01, 2019 2229  WBC 10.6*  NEUTROABS 7.3  HGB 13.1  HCT 46.0  MCV 94.5  PLT 219   Basic Metabolic Panel: Recent Labs  Lab Jun 01, 2019 2229  NA 138  K 3.5  CL 94*  CO2 36*  GLUCOSE 110*  BUN 14  CREATININE 0.76  CALCIUM 8.2*   GFR: Estimated Creatinine Clearance: 238.2 mL/min (by C-G formula based on SCr of 0.76 mg/dL). Liver Function Tests: Recent Labs  Lab Jun 01, 2019 2229  AST 17  ALT 17  ALKPHOS 97  BILITOT 1.2  PROT 7.6  ALBUMIN 2.7*   No results for input(s): LIPASE, AMYLASE in the last 168 hours. No results for input(s): AMMONIA in the last 168 hours. Coagulation Profile: No results for input(s): INR, PROTIME in the last 168 hours. Cardiac Enzymes: No results for input(s): CKTOTAL, CKMB, CKMBINDEX, TROPONINI in the last 168 hours. BNP (last 3 results) No results for input(s): PROBNP in the last  8760 hours. HbA1C: No results for input(s): HGBA1C in the last 72 hours. CBG: No results for input(s): GLUCAP in the last 168 hours. Lipid Profile: No results for input(s): CHOL, HDL, LDLCALC, TRIG, CHOLHDL, LDLDIRECT in the last 72 hours. Thyroid Function Tests: No results for input(s): TSH, T4TOTAL, FREET4, T3FREE, THYROIDAB in the last 72 hours. Anemia Panel: No results for input(s): VITAMINB12, FOLATE, FERRITIN, TIBC, IRON, RETICCTPCT in the last 72 hours. Urine analysis:    Component Value Date/Time   COLORURINE YELLOW 07/15/2018 0532   APPEARANCEUR CLEAR 07/15/2018 0532   LABSPEC 1.008 07/15/2018 0532   PHURINE 5.0 07/15/2018 0532   GLUCOSEU NEGATIVE 07/15/2018 0532   HGBUR MODERATE (A) 07/15/2018 0532   BILIRUBINUR NEGATIVE 07/15/2018 0532   KETONESUR NEGATIVE 07/15/2018 0532  PROTEINUR 30 (A) 07/15/2018 0532   UROBILINOGEN 2.0 (H) 06/18/2011 1055   NITRITE NEGATIVE 07/15/2018 0532   LEUKOCYTESUR NEGATIVE 07/15/2018 0532   Sepsis Labs: !!!!!!!!!!!!!!!!!!!!!!!!!!!!!!!!!!!!!!!!!!!! @LABRCNTIP (procalcitonin:4,lacticidven:4) )No results found for this or any previous visit (from the past 240 hour(s)).   Radiological Exams on Admission: Dg Chest Port 1 View  Result Date: 08/25/2018 CLINICAL DATA:  Dyspnea starting on Monday and progressively worsening. EXAM: PORTABLE CHEST 1 VIEW COMPARISON:  07/15/2018 FINDINGS: Stable cardiomegaly with mild diffuse interstitial edema. Nonaneurysmal appearing thoracic aorta. No alveolar consolidation to suggest pneumonia. The left costophrenic angle is excluded on this study. Definite effusion or pneumothorax. No acute osseous abnormality. IMPRESSION: Cardiomegaly with mild interstitial edema. Electronically Signed   By: Tollie Eth M.D.   On: 09/02/2018 20:33   Old chart reviewed Case discussed with EDP Dr. Clarice Pole Chest x-ray reviewed mild edema no infiltrate  Assessment/Plan 38 year old male with morbid obesity comes in with acute  respiratory failure with acute on chronic diastolic congestive heart failure exacerbation Principal Problem:   Acute on chronic respiratory failure with hypoxia (HCC)-continue CPAP mentating normally on CPAP diuresis.  Active Problems:   Acute on chronic diastolic CHF (congestive heart failure) (HCC)-on Lasix 40 mg orally daily at home will increase this to Lasix 4 mg IV every 12 hours.  Would not repeat echo at this time.  Monitor renal function while diuresing.    Essential hypertension-continue home meds    Cellulitis unclear-patient was attempted to give a dose of vancomycin and started to get red.  Per family and patient he has no redness anywhere that is unusual we will continue clindamycin at this time and monitor.  Mostly looks like chronic changes from chronic edema.    Obesity hypoventilation syndrome (HCC)-CPAP nightly    DVT prophylaxis: SCDs Code Status: Full Family Communication: Girlfriend Disposition Plan: Days Consults called: None Admission status: Admission   DAVID,RACHAL A MD [facility]  If 7PM-7AM, please contact night-coverage [email or web address] Password [facility]  08/28/2018, 12:26 AM

## 2018-08-28 NOTE — Evaluation (Signed)
Physical Therapy Evaluation Patient Details Name: Andre [name] MRN: [id number] DOB: 1981/07/03 Today's Date: 08/28/2018   History of Present Illness  38 year old morbidly obese male (BMI of 54), chronic diastolic CHF, OSA on CPAP, hypertension and recurrent cellulitis admitted with SOB - acute on chronic respiratory failure.  Clinical Impression  Pt admitted as above and presenting with functional mobility limitations 2* morbid obesity, testicular pain/edema and SOB with exertion.  This date, pt able to ambulate to/from bathroom with increased time and assist for equipment only.  Pt states he ambulates similarly at home but increased distance and with less SOB.    Follow Up Recommendations No PT follow up    Equipment Recommendations  None recommended by PT    Recommendations for Other Services       Precautions / Restrictions Precautions Precautions: Fall Precaution Comments: Pt with pain and difficulty mobilizing 2* testicular swelling Restrictions Weight Bearing Restrictions: No      Mobility  Bed Mobility Overal bed mobility: Modified Independent             General bed mobility comments: Pt to EOB with use of bed rail but no physical assist  Transfers Overall transfer level: Needs assistance   Transfers: Sit to/from Stand Sit to Stand: Min guard;Supervision         General transfer comment: Pt unassisted but awkward technique as pt manouvered around swollen testicles  Ambulation/Gait Ambulation/Gait assistance: Min guard;Supervision Gait Distance (Feet): 30 Feet(15' twice - to/from bathroom) Assistive device: None Gait Pattern/deviations: Step-through pattern;Decreased step length - right;Decreased step length - left;Shuffle;Wide base of support Gait velocity: (decr)   General Gait Details: Pt unassisted but ambulating with large BOS 2* swollen testicles  Stairs            Wheelchair Mobility    Modified Rankin (Stroke Patients Only)        Balance Overall balance assessment: Independent                                           Pertinent Vitals/Pain Pain Assessment: Faces Faces Pain Scale: Hurts even more Pain Location: groin pain with mobilization 2* testiular swelling Pain Descriptors / Indicators: Grimacing;Guarding;Sore Pain Intervention(s): Limited activity within patient's tolerance;Monitored during session    Home Living Family/patient expects to be discharged to:: Private residence Living Arrangements: Spouse/significant other Available Help at Discharge: Family Type of Home: House Home Access: Ramped entrance     Home Layout: One level Home Equipment: Environmental consultant - 2 wheels      Prior Function Level of Independence: Independent         Comments: Pt states he ambulates as needed at home and unassisted     Hand Dominance        Extremity/Trunk Assessment   Upper Extremity Assessment Upper Extremity Assessment: Overall WFL for tasks assessed    Lower Extremity Assessment Lower Extremity Assessment: Overall WFL for tasks assessed    Cervical / Trunk Assessment Cervical / Trunk Assessment: Other exceptions Cervical / Trunk Exceptions: Pt is morbidly obese with significant panniculus  Communication   Communication: No difficulties  Cognition Arousal/Alertness: Awake/alert Behavior During Therapy: WFL for tasks assessed/performed Overall Cognitive Status: Within Functional Limits for tasks assessed  General Comments      Exercises     Assessment/Plan    PT Assessment Patient needs continued PT services  PT Problem List Decreased activity tolerance;Decreased balance;Decreased mobility;Obesity;Pain       PT Treatment Interventions DME instruction;Gait training;Functional mobility training;Therapeutic activities    PT Goals (Current goals can be found in the Care Plan section)  Acute Rehab PT  Goals Patient Stated Goal: Breathe better and get home PT Goal Formulation: With patient Time For Goal Achievement: 09/11/18 Potential to Achieve Goals: Good    Frequency Min 3X/week   Barriers to discharge        Co-evaluation               AM-PAC PT "6 Clicks" Mobility  Outcome Measure Help needed turning from your back to your side while in a flat bed without using bedrails?: None Help needed moving from lying on your back to sitting on the side of a flat bed without using bedrails?: None Help needed moving to and from a bed to a chair (including a wheelchair)?: A Little Help needed standing up from a chair using your arms (e.g., wheelchair or bedside chair)?: A Little Help needed to walk in hospital room?: A Little Help needed climbing 3-5 steps with a railing? : A Little 6 Click Score: 20    End of Session Equipment Utilized During Treatment: Oxygen Activity Tolerance: Patient tolerated treatment well;Patient limited by fatigue;Patient limited by pain Patient left: in chair;with call bell/phone within reach Nurse Communication: Mobility status PT Visit Diagnosis: Difficulty in walking, not elsewhere classified (R26.2)    Time: 6283-1517 PT Time Calculation (min) (ACUTE ONLY): 25 min   Charges:   PT Evaluation $PT Eval Low Complexity: 1 Low          Mauro Kaufmann PT Acute Rehabilitation Services Pager [phone] Office [phone]   Marjon Doxtater 08/28/2018, 12:09 PM

## 2018-08-28 NOTE — Plan of Care (Signed)
38 year old male admitted this morning with history of diastolic heart failure obstructive sleep apnea hypoventilation syndrome morbid obesity weighs over 500 pounds with chronic lymphedema and panniculitis admitted with shortness of breath.  His fiance report he has been compliant with his medications.  When I saw him he was on BiPAP.  His fiance reports that he weighs 490 pounds and today he weighs over 500 pounds.  His last echo was in 7 of 2019 with normal ejection fraction with diastolic dysfunction.  Lasix dose has been increased to 40 mg twice a day as he takes 40 mg once a day at home.  Continue current management.

## 2018-08-28 NOTE — Progress Notes (Signed)
Lovenox per Pharmacy for DVT Prophylaxis    Pharmacy has been consulted from dosing enoxaparin (lovenox) in this patient for DVT prophylaxis.  The pharmacist has reviewed pertinent labs (Hgb 13.1___; PLT_219__), patient weight (_226__kg) and renal function (CrCl__>90_mL/min) and decided that enoxaparin _100_mg SQ Q24Hrs is appropriate for this patient.  The pharmacy department will sign off at this time.  Please reconsult pharmacy if status changes or for further issues.  Thank you  Luetta Nutting PharmD, BCPS  08/28/2018, 12:56 AM

## 2018-08-29 ENCOUNTER — Inpatient Hospital Stay (HOSPITAL_COMMUNITY): Payer: Medicare Other

## 2018-08-29 DIAGNOSIS — R0602 Shortness of breath: Secondary | ICD-10-CM

## 2018-08-29 LAB — ECHOCARDIOGRAM COMPLETE
Height: 66 in
Weight: 8000 oz

## 2018-08-29 LAB — BASIC METABOLIC PANEL
Anion gap: 9 (ref 5–15)
BUN: 14 mg/dL (ref 6–20)
CO2: 38 mmol/L — ABNORMAL HIGH (ref 22–32)
CREATININE: 0.68 mg/dL (ref 0.61–1.24)
Calcium: 8.1 mg/dL — ABNORMAL LOW (ref 8.9–10.3)
Chloride: 91 mmol/L — ABNORMAL LOW (ref 98–111)
GFR calc non Af Amer: >60 mL/min (ref >60)
Glucose, Bld: 111 mg/dL — ABNORMAL HIGH (ref 70–99)
Potassium: 4.7 mmol/L (ref 3.5–5.1)
Sodium: 138 mmol/L (ref 135–145)

## 2018-08-29 MED ORDER — POTASSIUM CHLORIDE CRYS ER 20 MEQ PO TBCR
20.0000 meq | EXTENDED_RELEASE_TABLET | Freq: Two times a day (BID) | ORAL | Status: DC
  Administered 2018-08-29 – 2018-09-02 (×5): 20 meq via ORAL
  Filled 2018-08-29 (×6): qty 1

## 2018-08-29 MED ORDER — FLUCONAZOLE IN SODIUM CHLORIDE 200-0.9 MG/100ML-% IV SOLN
200.0000 mg | INTRAVENOUS | Status: DC
  Administered 2018-08-29 – 2018-08-30 (×2): 200 mg via INTRAVENOUS
  Filled 2018-08-29 (×2): qty 100

## 2018-08-29 MED ORDER — FUROSEMIDE 10 MG/ML IJ SOLN
80.0000 mg | Freq: Two times a day (BID) | INTRAMUSCULAR | Status: DC
  Administered 2018-08-29 – 2018-08-30 (×2): 80 mg via INTRAVENOUS
  Filled 2018-08-29 (×2): qty 8

## 2018-08-29 MED ORDER — DIPHENHYDRAMINE-ZINC ACETATE 2-0.1 % EX CREA
TOPICAL_CREAM | Freq: Three times a day (TID) | CUTANEOUS | Status: DC | PRN
  Administered 2018-08-29: 22:00:00 via TOPICAL
  Filled 2018-08-29: qty 28

## 2018-08-29 MED ORDER — NYSTATIN 100000 UNIT/GM EX CREA
TOPICAL_CREAM | Freq: Two times a day (BID) | CUTANEOUS | Status: DC
  Administered 2018-08-29 – 2018-09-05 (×13): via TOPICAL
  Administered 2018-09-05: 1 via TOPICAL
  Administered 2018-09-05 – 2018-09-06 (×2): via TOPICAL
  Administered 2018-09-06: 1 via TOPICAL
  Administered 2018-09-07: 22:00:00 via TOPICAL
  Administered 2018-09-07 – 2018-09-08 (×2): 1 via TOPICAL
  Filled 2018-08-29 (×3): qty 15

## 2018-08-29 MED ORDER — PERFLUTREN LIPID MICROSPHERE
1.0000 mL | INTRAVENOUS | Status: AC | PRN
  Administered 2018-08-29: 2 mL via INTRAVENOUS
  Filled 2018-08-29: qty 10

## 2018-08-29 NOTE — Progress Notes (Signed)
  Echocardiogram 2D Echocardiogram w/ definity has been performed.  Andre [name] L Androw 08/29/2018, 2:31 PM

## 2018-08-29 NOTE — Progress Notes (Signed)
PROGRESS NOTE    Andre [name]  NGE:[id number] DOB: Dec 01, 1980 DOA: 09/10/2018 PCP: Annita BrodAsenso, Philip, MD    Brief Narrative: 38 y.o. male with medical history significant of morbid obesity, chronic lymphedema and panniculitis, diastolic congestive heart failure, obstructive sleep apnea and hypo-ventilation syndrome requiring CPAP nightly, hypertension comes in because of shortness of breath.  Patient is compliant with his medications.  He was started several days ago on clindamycin for presumptive cellulitis.  His girlfriend states that his swelling is no worse than normal and she reports no change in the chronic changes and redness to his legs.  His scrotum is always swollen.  He does not weigh himself at home.  He has not had any fevers.  Comes in with shortness of breath but they do feel he is more swollen than normal.  He has not had any URI symptoms or any respiratory symptoms except shortness of breath.  He has chronic lymphedema particularly in his left leg and in his panniculus and scrotum.  Patient is being referred for admission for acute on proximal respiratory failure he was initially oxygen sats of 80% on arrival.  Assessment & Plan:   #1 acute on chronic hypoxic respiratory failure-secondary to morbid obesity hypoventilation syndrome acute on chronic diastolic heart failure exacerbation.  Patient was started on Lasix 40 mg twice a day with negative over 2 L at this time.  However he still remains dyspneic using BiPAP last night.  Will increase Lasix to 80 mg twice a day.  Recheck renal functions today.  Obtain echocardiogram.  His last echo from a year ago ejection fraction was normal with diastolic dysfunction.  Patient needs to be monitored in the hospital still with oxygen on 6 L he reports that he is on 5 L of oxygen at home at all the time however he reports he does not have oxygen at home but he supposed to be on oxygen.  Will consult case management to arrange for home O2 upon  discharge.  IV diuresis and monitor renal functions.  #2 hypertension she remains elevated on medications increase Lasix  #3 scrotal edema and swelling with itching probably secondary to Candida we will start Diflucan nystatin and Benadryl cream  #4 obesity hypoventilation syndrome continue CPAP at night.      Estimated body mass index is 80.7 kg/m as calculated from the following:   Height as of this encounter: 5\' 6"  (1.676 m).   Weight as of this encounter: 226.8 kg.  DVT prophylaxis: Lovenox  code Status: Full code Family Communication: No family at present Disposition Plan Home when stable   Consultants:   None  Procedures: None Antimicrobials: Diflucan  Subjective: Complains of scrotal itching and shortness of breath  Objective: Vitals:   08/29/18 0500 08/29/18 0800 08/29/18 0947 08/29/18 0950  BP: (!) 181/66  (!) 171/74 (!) 171/74  Pulse: 88     Resp: (!) 21     Temp:  (!) 97.5 F (36.4 C)    TempSrc:  Axillary    SpO2: 95%     Weight:      Height:        Intake/Output Summary (Last 24 hours) at 08/29/2018 1005 Last data filed at 08/29/2018 0951 Gross per 24 hour  Intake 3 ml  Output 2276 ml  Net -2273 ml   Filed Weights   08/23/2018 1939  Weight: (!) 226.8 kg    Examination:  General exam: Appears calm and comfortable  Respiratory system: Diminished breath sounds to  auscultation. Respiratory effort normal. Cardiovascular system: S1 & S2 heard, RRR. No JVD, murmurs, rubs, gallops or clicks. Gastrointestinal system: Abdomen is distended, soft and nontender. No organomegaly or masses felt. Normal bowel sounds heard. Central nervous system: Alert and oriented. No focal neurological deficits. Extremities: 2+ pitting edema. Skin: No rashes, lesions or ulcers Psychiatry: Judgement and insight appear normal. Mood & affect appropriate.     Data Reviewed: I have personally reviewed following labs and imaging studies  CBC: Recent Labs  Lab  09/07/2018 2229 08/28/18 0247  WBC 10.6* 9.8  NEUTROABS 7.3  --   HGB 13.1 13.3  HCT 46.0 48.2  MCV 94.5 93.4  PLT 219 217   Basic Metabolic Panel: Recent Labs  Lab 08/29/2018 2229 08/28/18 0247  NA 138 138  K 3.5 3.6  CL 94* 94*  CO2 36* 36*  GLUCOSE 110* 112*  BUN 14 13  CREATININE 0.76 0.73  CALCIUM 8.2* 8.1*   GFR: Estimated Creatinine Clearance: 228.4 mL/min (by C-G formula based on SCr of 0.73 mg/dL). Liver Function Tests: Recent Labs  Lab 09/09/2018 2229  AST 17  ALT 17  ALKPHOS 97  BILITOT 1.2  PROT 7.6  ALBUMIN 2.7*   No results for input(s): LIPASE, AMYLASE in the last 168 hours. No results for input(s): AMMONIA in the last 168 hours. Coagulation Profile: No results for input(s): INR, PROTIME in the last 168 hours. Cardiac Enzymes: No results for input(s): CKTOTAL, CKMB, CKMBINDEX, TROPONINI in the last 168 hours. BNP (last 3 results) No results for input(s): PROBNP in the last 8760 hours. HbA1C: No results for input(s): HGBA1C in the last 72 hours. CBG: No results for input(s): GLUCAP in the last 168 hours. Lipid Profile: No results for input(s): CHOL, HDL, LDLCALC, TRIG, CHOLHDL, LDLDIRECT in the last 72 hours. Thyroid Function Tests: No results for input(s): TSH, T4TOTAL, FREET4, T3FREE, THYROIDAB in the last 72 hours. Anemia Panel: No results for input(s): VITAMINB12, FOLATE, FERRITIN, TIBC, IRON, RETICCTPCT in the last 72 hours. Sepsis Labs: Recent Labs  Lab 08/19/2018 2229  LATICACIDVEN 1.3    Recent Results (from the past 240 hour(s))  Culture, blood (routine x 2)     Status: None (Preliminary result)   Collection Time: 09/07/2018 10:30 PM  Result Value Ref Range Status   Specimen Description   Final    BLOOD RIGHT HAND Performed at [facility], [address] [place], [place] [address]    Special Requests   Final    BOTTLES DRAWN AEROBIC ONLY Blood Culture results may not be optimal due to an inadequate volume of  blood received in culture bottles Performed at [facility], [address] [place], [place] [address]    Culture   Final    NO GROWTH 2 DAYS Performed at [facility], [address], [place], [place] [address]    Report Status PENDING  Incomplete  Culture, blood (routine x 2)     Status: None (Preliminary result)   Collection Time: 09/09/2018 10:30 PM  Result Value Ref Range Status   Specimen Description   Final    BLOOD LEFT HAND Performed at [facility], [address], [place], [place] [address]    Special Requests   Final    BOTTLES DRAWN AEROBIC AND ANAEROBIC Blood Culture adequate volume Performed at [facility], [address], [place], [place] [address]    Culture   Final    NO GROWTH 2 DAYS  Performed at [facility], [address] [place], [place] [address]    Report Status PENDING  Incomplete  MRSA PCR Screening     Status: None   Collection Time: 08/28/18  2:10 AM  Result Value Ref Range Status   MRSA by PCR NEGATIVE NEGATIVE Final    Comment:        The GeneXpert MRSA Assay (FDA approved for NASAL specimens only), is one component of a comprehensive MRSA colonization surveillance program. It is not intended to diagnose MRSA infection nor to guide or monitor treatment for MRSA infections. Performed at [facility], [address], [place], [place] [address]          Radiology Studies: Dg Chest Port 1 View  Result Date: 2018-08-31 CLINICAL DATA:  Dyspnea starting on Monday and progressively worsening. EXAM: PORTABLE CHEST 1 VIEW COMPARISON:  07/15/2018 FINDINGS: Stable cardiomegaly with mild diffuse interstitial edema. Nonaneurysmal appearing thoracic aorta. No alveolar consolidation to suggest pneumonia. The left costophrenic angle is excluded on this study. Definite effusion or pneumothorax. No acute osseous abnormality. IMPRESSION: Cardiomegaly with mild  interstitial edema. Electronically Signed   By: Tollie Eth M.D.   On: August 31, 2018 20:33        Scheduled Meds: . amLODipine  10 mg Oral Daily  . chlorhexidine  15 mL Mouth Rinse BID  . clindamycin  300 mg Oral QID  . enoxaparin (LOVENOX) injection  100 mg Subcutaneous Q24H  . furosemide  80 mg Intravenous BID  . lisinopril  5 mg Oral Daily  . mouth rinse  15 mL Mouth Rinse q12n4p  . nystatin cream   Topical BID  . potassium chloride  20 mEq Oral BID  . pregabalin  25 mg Oral TID  . sodium chloride flush  3 mL Intravenous Q12H   Continuous Infusions: . sodium chloride    . fluconazole (DIFLUCAN) IV       LOS: 1 day      Alwyn Ren, MD [facility]  If 7PM-7AM, please contact night-coverage [email or web address] Password [facility] 08/29/2018, 10:05 AM

## 2018-08-29 NOTE — Discharge Summary (Signed)
Physician Discharge Summary  Andre BridgemanDouglas L Dolores ZOX:[id number] DOB: 07-Apr-1981 DOA: 22-Jun-2019  PCP: Annita BrodAsenso, Philip, MD  Admit date: 22-Jun-2019 Discharge date: 08/30/2018  Admitted From: Home Disposition: Home  Recommendations for Outpatient Follow-up:  1. Follow up with PCP in 1-2 weeks 2. Please obtain BMP/CBC in one week  Home Health none Equipment/Devices oxygen Discharge Condition: Stable and improved CODE STATUS: Full code Diet recommendation: Cardiac Brief/Interim Summary:38 y.o.malewith medical history significant ofmorbid obesity, chronic lymphedema and panniculitis, diastolic congestive heart failure, obstructive sleep apnea and hypo-ventilation syndrome requiring CPAP nightly, hypertension comes in because of shortness of breath. Patient is compliant with his medications. He was started several days ago on clindamycin for presumptive cellulitis. His girlfriend states that his swelling is no worse than normal and she reports no change in the chronic changes and redness to his legs. His scrotum is always swollen. He does not weigh himself at home. He has not had any fevers. Comes in with shortness of breath but they do feel he is more swollen than normal. He has not had any URI symptoms or any respiratory symptoms except shortness of breath. He has chronic lymphedema particularly in his left leg and in his panniculus and scrotum. Patient is being referred for admission for acute on proximal respiratory failure he was initially oxygen sats of 80% on arrival.   Discharge Diagnoses:  Principal Problem:   Acute on chronic respiratory failure with hypoxia (HCC) Active Problems:   Essential hypertension   Acute on chronic diastolic CHF (congestive heart failure) (HCC)   Obesity hypoventilation syndrome (HCC)   Lymphedema   #1 acute on chronic hypoxic respiratory failure-secondary to morbid obesity hypoventilation syndrome acute on chronic diastolic heart failure  exacerbation.  Patient was started on Lasix 40 mg twice daily.  He continued to have significant edema so the Lasix dose was increased to 80 mg twice a day.  He will be discharged on that same dose.  Echocardiogram again shows normal ejection fraction during this admission.  Patient will follow-up with his PCP in 1 week to monitor his renal functions on Lasix.  He is supposed to have oxygen at home but he has not been using or having it at home.  Will consult case management to arrange for home oxygen.  #2 hypertension restart ACE inhibitor continue Lasix 80 mg twice a day.   #3 scrotal edema and swelling with itching  secondary to Candida we will start Diflucan nystatin and Benadryl cream  #4 obesity hypoventilation syndrome continue CPAP at night.   Estimated body mass index is 97.97 kg/m as calculated from the following:   Height as of this encounter: 5\' 6"  (1.676 m).   Weight as of this encounter: 275.3 kg.  Discharge Instructions   Allergies as of 08/30/2018      Reactions   Other Hives   Steroid, but cannot remember name.      Medication List    STOP taking these medications   amLODipine 10 MG tablet Commonly known as:  NORVASC   clindamycin 150 MG capsule Commonly known as:  CLEOCIN   ibuprofen 200 MG tablet Commonly known as:  ADVIL,MOTRIN     TAKE these medications   albuterol 108 (90 Base) MCG/ACT inhaler Commonly known as:  PROVENTIL HFA;VENTOLIN HFA Inhale 2 puffs into the lungs every 6 (six) hours as needed for wheezing or shortness of breath.   diphenhydrAMINE-zinc acetate cream Commonly known as:  BENADRYL Apply topically 3 (three) times daily as needed for itching.  fluconazole 100 MG tablet Commonly known as:  DIFLUCAN Take 1 tablet (100 mg total) by mouth daily for 5 days.   furosemide 40 MG tablet Commonly known as:  LASIX Take 2 tablets (80 mg total) by mouth 2 (two) times daily for 30 days. What changed:    how much to take  when to  take this   lisinopril 5 MG tablet Commonly known as:  PRINIVIL,ZESTRIL Take 1 tablet (5 mg total) by mouth daily.   nystatin cream Commonly known as:  MYCOSTATIN Apply topically 2 (two) times daily.   potassium chloride SA 20 MEQ tablet Commonly known as:  K-DUR,KLOR-CON Take 1 tablet (20 mEq total) by mouth 2 (two) times daily.   pregabalin 25 MG capsule Commonly known as:  LYRICA Take 25 mg by mouth 3 (three) times daily.       Allergies  Allergen Reactions  . Other Hives    Steroid, but cannot remember name.    Consultations: None  Procedures/Studies: Dg Chest Port 1 View  Result Date: 09/20/2018 CLINICAL DATA:  Dyspnea starting on Monday and progressively worsening. EXAM: PORTABLE CHEST 1 VIEW COMPARISON:  07/15/2018 FINDINGS: Stable cardiomegaly with mild diffuse interstitial edema. Nonaneurysmal appearing thoracic aorta. No alveolar consolidation to suggest pneumonia. The left costophrenic angle is excluded on this study. Definite effusion or pneumothorax. No acute osseous abnormality. IMPRESSION: Cardiomegaly with mild interstitial edema. Electronically Signed   By: Tollie Eth M.D.   On: 09/20/2018 20:33   (Echo, Carotid, EGD, Colonoscopy, ERCP)    Subjective: Patient resting in bed his main complaint is scrotal itching breathing is back to baseline   Discharge Exam: Vitals:   08/30/18 0600 08/30/18 0724  BP: (!) 159/82   Pulse: 87 86  Resp: 18 (!) 22  Temp:    SpO2: 97% 99%   Vitals:   08/30/18 0325 08/30/18 0500 08/30/18 0600 08/30/18 0724  BP:  (!) 157/78 (!) 159/82   Pulse:  84 87 86  Resp:  17 18 (!) 22  Temp: (!) 97.2 F (36.2 C)     TempSrc: Axillary     SpO2:  98% 97% 99%  Weight:      Height:        General: Pt is alert, awake, not in acute distress Cardiovascular: RRR, S1/S2 +, no rubs, no gallops Respiratory: Scattered rhonchi and decreased breath sounds at the bases bilaterally, no wheezing, no rhonchi Abdominal: Soft, NT, ND,  bowel sounds + Extremities: 1+ pitting edema in both lower extremities   The results of significant diagnostics from this hospitalization (including imaging, microbiology, ancillary and laboratory) are listed below for reference.     Microbiology: Recent Results (from the past 240 hour(s))  Culture, blood (routine x 2)     Status: None (Preliminary result)   Collection Time: 09-20-2018 10:30 PM  Result Value Ref Range Status   Specimen Description   Final    BLOOD RIGHT HAND Performed at [facility], [address] [place], [place] [address]    Special Requests   Final    BOTTLES DRAWN AEROBIC ONLY Blood Culture results may not be optimal due to an inadequate volume of blood received in culture bottles Performed at [facility], [address], [place], [place] [address]    Culture   Final    NO GROWTH 3 DAYS Performed at [facility], [address], [place], [place] [address]    Report Status PENDING  Incomplete  Culture,  blood (routine x 2)     Status: None (Preliminary result)   Collection Time: 09-07-2018 10:30 PM  Result Value Ref Range Status   Specimen Description   Final    BLOOD LEFT HAND Performed at [facility], [address], [place], [place] [address]    Special Requests   Final    BOTTLES DRAWN AEROBIC AND ANAEROBIC Blood Culture adequate volume Performed at [facility], [address] [place], [place] [address]    Culture   Final    NO GROWTH 3 DAYS Performed at [facility], [address], [place], [place] [address]    Report Status PENDING  Incomplete  MRSA PCR Screening     Status: None   Collection Time: 08/28/18  2:10 AM  Result Value Ref Range Status   MRSA by PCR NEGATIVE NEGATIVE Final    Comment:        The GeneXpert MRSA Assay (FDA approved for NASAL specimens only), is one component of a comprehensive MRSA colonization surveillance program. It is  not intended to diagnose MRSA infection nor to guide or monitor treatment for MRSA infections. Performed at [facility], [address], [place], [place] [address]      Labs: BNP (last 3 results) Recent Labs    12/08/17 1647 Sep 07, 2018 2229  BNP 19.7 235.6*   Basic Metabolic Panel: Recent Labs  Lab 07-Sep-2018 2229 08/28/18 0247 08/29/18 1107  NA 138 138 138  K 3.5 3.6 4.7  CL 94* 94* 91*  CO2 36* 36* 38*  GLUCOSE 110* 112* 111*  BUN 14 13 14   CREATININE 0.76 0.73 0.68  CALCIUM 8.2* 8.1* 8.1*   Liver Function Tests: Recent Labs  Lab 09/07/18 2229  AST 17  ALT 17  ALKPHOS 97  BILITOT 1.2  PROT 7.6  ALBUMIN 2.7*   No results for input(s): LIPASE, AMYLASE in the last 168 hours. No results for input(s): AMMONIA in the last 168 hours. CBC: Recent Labs  Lab 09-07-2018 2229 08/28/18 0247  WBC 10.6* 9.8  NEUTROABS 7.3  --   HGB 13.1 13.3  HCT 46.0 48.2  MCV 94.5 93.4  PLT 219 217   Cardiac Enzymes: No results for input(s): CKTOTAL, CKMB, CKMBINDEX, TROPONINI in the last 168 hours. BNP: Invalid input(s): POCBNP CBG: No results for input(s): GLUCAP in the last 168 hours. D-Dimer No results for input(s): DDIMER in the last 72 hours. Hgb A1c No results for input(s): HGBA1C in the last 72 hours. Lipid Profile No results for input(s): CHOL, HDL, LDLCALC, TRIG, CHOLHDL, LDLDIRECT in the last 72 hours. Thyroid function studies No results for input(s): TSH, T4TOTAL, T3FREE, THYROIDAB in the last 72 hours.  Invalid input(s): FREET3 Anemia work up No results for input(s): VITAMINB12, FOLATE, FERRITIN, TIBC, IRON, RETICCTPCT in the last 72 hours. Urinalysis    Component Value Date/Time   COLORURINE AMBER (A) 08/28/2018 0043   APPEARANCEUR CLEAR 08/28/2018 0043   LABSPEC 1.017 08/28/2018 0043   PHURINE 5.0 08/28/2018 0043   GLUCOSEU NEGATIVE 08/28/2018 0043   HGBUR MODERATE (A) 08/28/2018 0043   BILIRUBINUR NEGATIVE 08/28/2018 0043    KETONESUR NEGATIVE 08/28/2018 0043   PROTEINUR 100 (A) 08/28/2018 0043   UROBILINOGEN 2.0 (H) 06/18/2011 1055   NITRITE POSITIVE (A) 08/28/2018 0043   LEUKOCYTESUR NEGATIVE 08/28/2018 0043   Sepsis Labs Invalid input(s): PROCALCITONIN,  WBC,  LACTICIDVEN Microbiology Recent Results (from the past 240 hour(s))  Culture, blood (routine x 2)  Status: None (Preliminary result)   Collection Time: 08/20/2018 10:30 PM  Result Value Ref Range Status   Specimen Description   Final    BLOOD RIGHT HAND Performed at [facility], [address] [place], [place] [address]    Special Requests   Final    BOTTLES DRAWN AEROBIC ONLY Blood Culture results may not be optimal due to an inadequate volume of blood received in culture bottles Performed at [facility], [address] [place], [place] [address]    Culture   Final    NO GROWTH 3 DAYS Performed at [facility], [address] [place], [place] [address]    Report Status PENDING  Incomplete  Culture, blood (routine x 2)     Status: None (Preliminary result)   Collection Time: 08/23/2018 10:30 PM  Result Value Ref Range Status   Specimen Description   Final    BLOOD LEFT HAND Performed at [facility], [address] [place], [place] [address]    Special Requests   Final    BOTTLES DRAWN AEROBIC AND ANAEROBIC Blood Culture adequate volume Performed at [facility], [address] [place], [place] [address]    Culture   Final    NO GROWTH 3 DAYS Performed at [facility], [address] [place], [place] [address]    Report Status PENDING  Incomplete  MRSA PCR Screening     Status: None   Collection Time: 08/28/18  2:10 AM  Result Value Ref Range Status   MRSA by PCR NEGATIVE NEGATIVE Final    Comment:        The GeneXpert MRSA Assay (FDA approved for NASAL specimens only), is one component of a comprehensive MRSA  colonization surveillance program. It is not intended to diagnose MRSA infection nor to guide or monitor treatment for MRSA infections. Performed at [facility], [address] [place], [place] [address]      Time coordinating discharge: 3 4 minutes  SIGNED:   Alwyn RenElizabeth G Jennamarie Goings, MD  [facility] 08/30/2018, 7:58 AM Pager   If 7PM-7AM, please contact night-coverage [email or web address] Password [email or web address]

## 2018-08-30 ENCOUNTER — Inpatient Hospital Stay (HOSPITAL_COMMUNITY): Payer: Medicare Other

## 2018-08-30 LAB — COMPREHENSIVE METABOLIC PANEL
ALT: 13 U/L (ref 0–44)
AST: 14 U/L — ABNORMAL LOW (ref 15–41)
Albumin: 2.9 g/dL — ABNORMAL LOW (ref 3.5–5.0)
Alkaline Phosphatase: 111 U/L (ref 38–126)
Anion gap: 4 — ABNORMAL LOW (ref 5–15)
BUN: 13 mg/dL (ref 6–20)
CHLORIDE: 88 mmol/L — AB (ref 98–111)
CO2: 47 mmol/L — ABNORMAL HIGH (ref 22–32)
Calcium: 8.4 mg/dL — ABNORMAL LOW (ref 8.9–10.3)
Creatinine, Ser: 0.49 mg/dL — ABNORMAL LOW (ref 0.61–1.24)
GFR calc Af Amer: >60 mL/min (ref >60)
GFR calc non Af Amer: >60 mL/min (ref >60)
Glucose, Bld: 97 mg/dL (ref 70–99)
Potassium: 4.4 mmol/L (ref 3.5–5.1)
Sodium: 139 mmol/L (ref 135–145)
Total Bilirubin: 1 mg/dL (ref 0.3–1.2)
Total Protein: 7.7 g/dL (ref 6.5–8.1)

## 2018-08-30 LAB — CBC
HCT: 46.5 % (ref 39.0–52.0)
Hemoglobin: 12.5 g/dL — ABNORMAL LOW (ref 13.0–17.0)
MCH: 26.7 pg (ref 26.0–34.0)
MCHC: 26.9 g/dL — ABNORMAL LOW (ref 30.0–36.0)
MCV: 99.1 fL (ref 80.0–100.0)
PLATELETS: 159 10*3/uL (ref 150–400)
RBC: 4.69 MIL/uL (ref 4.22–5.81)
RDW: 20.8 % — ABNORMAL HIGH (ref 11.5–15.5)
WBC: 6.9 10*3/uL (ref 4.0–10.5)
nRBC: 0.4 % — ABNORMAL HIGH (ref 0.0–0.2)

## 2018-08-30 LAB — BLOOD GAS, ARTERIAL
Acid-Base Excess: 17 mmol/L — ABNORMAL HIGH (ref 0.0–2.0)
Bicarbonate: 48.4 mmol/L — ABNORMAL HIGH (ref 20.0–28.0)
Drawn by: 441261
O2 Content: 6 L/min
O2 SAT: 93.9 %
Patient temperature: 98.6
pCO2 arterial: 106 mmHg (ref 32.0–48.0)
pH, Arterial: 7.282 — ABNORMAL LOW (ref 7.350–7.450)
pO2, Arterial: 72.1 mmHg — ABNORMAL LOW (ref 83.0–108.0)

## 2018-08-30 MED ORDER — FLUCONAZOLE 100 MG PO TABS: 100.0000 mg | ORAL_TABLET | Freq: Every day | ORAL | 0 refills | Status: AC

## 2018-08-30 MED ORDER — FUROSEMIDE 10 MG/ML IJ SOLN
100.0000 mg | Freq: Two times a day (BID) | INTRAVENOUS | Status: DC
  Administered 2018-08-30 – 2018-08-31 (×2): 100 mg via INTRAVENOUS
  Filled 2018-08-30 (×3): qty 10

## 2018-08-30 MED ORDER — FUROSEMIDE 40 MG PO TABS: 80.0000 mg | ORAL_TABLET | Freq: Two times a day (BID) | ORAL | 1 refills | Status: AC

## 2018-08-30 MED ORDER — POTASSIUM CHLORIDE CRYS ER 20 MEQ PO TBCR: 20.0000 meq | EXTENDED_RELEASE_TABLET | Freq: Two times a day (BID) | ORAL | 0 refills | Status: AC

## 2018-08-30 MED ORDER — NYSTATIN 100000 UNIT/GM EX CREA: TOPICAL_CREAM | Freq: Two times a day (BID) | CUTANEOUS | 0 refills | Status: AC

## 2018-08-30 MED ORDER — DIPHENHYDRAMINE-ZINC ACETATE 2-0.1 % EX CREA: TOPICAL_CREAM | Freq: Three times a day (TID) | CUTANEOUS | 0 refills | Status: AC | PRN

## 2018-08-30 MED ORDER — FLUCONAZOLE 200 MG PO TABS
200.0000 mg | ORAL_TABLET | Freq: Every day | ORAL | Status: DC
  Administered 2018-09-02: 200 mg via ORAL
  Filled 2018-08-30: qty 1

## 2018-08-30 NOTE — Progress Notes (Signed)
PROGRESS NOTE    Andre [name]  ZOX:[id number] DOB: 14-Oct-1980 DOA: 08/19/2018 PCP: Annita BrodAsenso, Philip, MD   Brief Narrative:38 y.o.malewith medical history significant ofmorbid obesity, chronic lymphedema and panniculitis, diastolic congestive heart failure, obstructive sleep apnea and hypo-ventilation syndrome requiring CPAP nightly, hypertension comes in because of shortness of breath. Patient is compliant with his medications. He was started several days ago on clindamycin for presumptive cellulitis. His girlfriend states that his swelling is no worse than normal and she reports no change in the chronic changes and redness to his legs. His scrotum is always swollen. He does not weigh himself at home. He has not had any fevers. Comes in with shortness of breath but they do feel he is more swollen than normal. He has not had any URI symptoms or any respiratory symptoms except shortness of breath. He has chronic lymphedema particularly in his left leg and in his panniculus and scrotum. Patient is being referred for admission for acute on proximal respiratory failure he was initially oxygen sats of 80% on arrival.   Assessment & Plan:   Principal Problem:   Acute on chronic respiratory failure with hypoxia (HCC) Active Problems:   Essential hypertension   Acute on chronic diastolic CHF (congestive heart failure) (HCC)   Obesity hypoventilation syndrome (HCC)   Lymphedema   #1 acute on chronic hypoxic respiratory failure-secondary to morbid obesity hypoventilation syndrome acute on chronic diastolic heart failure exacerbation.  Patient was started on Lasix 40 mg twice a day with negative over 2 L at this time.  However he still remains dyspneic using BiPAP last night.  Will increase Lasix to 80 mg twice a day.  Recheck renal functions tomorrow.  Echo with normal ejection fraction but diastolic dysfunction.  Patient continues to need 6 L of oxygen in hospital.  Patient needs a sleep  study in order to obtain a new BiPAP per his insurance. Continue IV diuresis and monitor renal functions.  I do not see an ABG during this admission will obtain a gas.  #2 hypertension she remains elevated on medications increase Lasix  #3 scrotal edema and swelling with itching probably secondary to Candida we will start Diflucan nystatin and Benadryl cream  #4 obesity hypoventilation syndrome continue CPAP at night.  DVT prophylaxis: Lovenox  code Status: Full code Family Communication: No family at present Disposition Plan Home when stable   Consultants:   None  Procedures: None Antimicrobials: Diflucan     Estimated body mass index is 97.97 kg/m as calculated from the following:   Height as of this encounter: 5\' 6"  (1.676 m).   Weight as of this encounter: 275.3 kg.    Subjective: When I saw him early this morning patient was awake and alert and had taken the BiPAP off and was anxious to go home.  Later part of the day patient has become sleepier and with decreased mentation.  Objective: Vitals:   08/30/18 0500 08/30/18 0600 08/30/18 0724 08/30/18 0800  BP: (!) 157/78 (!) 159/82  139/64  Pulse: 84 87 86 85  Resp: 17 18 (!) 22 (!) 21  Temp:    (!) 96.8 F (36 C)  TempSrc:    Axillary  SpO2: 98% 97% 99% 95%  Weight:      Height:        Intake/Output Summary (Last 24 hours) at 08/30/2018 1141 Last data filed at 08/29/2018 2300 Gross per 24 hour  Intake 410 ml  Output 1301 ml  Net -891 ml  Filed Weights   2018/09/12 1939 08/29/18 2300  Weight: (!) 226.8 kg (!) 275.3 kg    Examination:  General exam: Appears calm and comfortable  Respiratory system: Coarse to auscultation. Respiratory effort normal. Cardiovascular system: S1 & S2 heard, RRR. No JVD, murmurs, rubs, gallops or clicks. No pedal edema. Gastrointestinal system: Abdomen is nondistended, soft and nontender. No organomegaly or masses felt. Normal bowel sounds heard. Central nervous  system: Alert and oriented. No focal neurological deficits. Extremities 2+ pitting edema Skin: No rashes, lesions or ulcers Psychiatry: Judgement and insight appear normal. Mood & affect appropriate.     Data Reviewed: I have personally reviewed following labs and imaging studies  CBC: Recent Labs  Lab 2018-09-12 2229 08/28/18 0247 08/30/18 0857  WBC 10.6* 9.8 6.9  NEUTROABS 7.3  --   --   HGB 13.1 13.3 12.5*  HCT 46.0 48.2 46.5  MCV 94.5 93.4 99.1  PLT 219 217 159   Basic Metabolic Panel: Recent Labs  Lab 09/12/2018 2229 08/28/18 0247 08/29/18 1107 08/30/18 0857  NA 138 138 138 139  K 3.5 3.6 4.7 4.4  CL 94* 94* 91* 88*  CO2 36* 36* 38* 47*  GLUCOSE 110* 112* 111* 97  BUN 14 13 14 13   CREATININE 0.76 0.73 0.68 0.49*  CALCIUM 8.2* 8.1* 8.1* 8.4*   GFR: Estimated Creatinine Clearance: 262.8 mL/min (A) (by C-G formula based on SCr of 0.49 mg/dL (L)). Liver Function Tests: Recent Labs  Lab September 12, 2018 2229 08/30/18 0857  AST 17 14*  ALT 17 13  ALKPHOS 97 111  BILITOT 1.2 1.0  PROT 7.6 7.7  ALBUMIN 2.7* 2.9*   No results for input(s): LIPASE, AMYLASE in the last 168 hours. No results for input(s): AMMONIA in the last 168 hours. Coagulation Profile: No results for input(s): INR, PROTIME in the last 168 hours. Cardiac Enzymes: No results for input(s): CKTOTAL, CKMB, CKMBINDEX, TROPONINI in the last 168 hours. BNP (last 3 results) No results for input(s): PROBNP in the last 8760 hours. HbA1C: No results for input(s): HGBA1C in the last 72 hours. CBG: No results for input(s): GLUCAP in the last 168 hours. Lipid Profile: No results for input(s): CHOL, HDL, LDLCALC, TRIG, CHOLHDL, LDLDIRECT in the last 72 hours. Thyroid Function Tests: No results for input(s): TSH, T4TOTAL, FREET4, T3FREE, THYROIDAB in the last 72 hours. Anemia Panel: No results for input(s): VITAMINB12, FOLATE, FERRITIN, TIBC, IRON, RETICCTPCT in the last 72 hours. Sepsis Labs: Recent Labs  Lab  2018-09-12 2229  LATICACIDVEN 1.3    Recent Results (from the past 240 hour(s))  Culture, blood (routine x 2)     Status: None (Preliminary result)   Collection Time: 09-12-2018 10:30 PM  Result Value Ref Range Status   Specimen Description   Final    BLOOD RIGHT HAND Performed at [facility], [address], [place], [place] [address]    Special Requests   Final    BOTTLES DRAWN AEROBIC ONLY Blood Culture results may not be optimal due to an inadequate volume of blood received in culture bottles Performed at [facility], [address], [place], [place] [address]    Culture   Final    NO GROWTH 3 DAYS Performed at [facility], [address], [place], [place] [address]    Report Status PENDING  Incomplete  Culture, blood (routine x 2)     Status: None (Preliminary result)   Collection Time: Sep 12, 2018 10:30 PM  Result Value Ref  Range Status   Specimen Description   Final    BLOOD LEFT HAND Performed at [facility] [address], [place], [place] [address]    Special Requests   Final    BOTTLES DRAWN AEROBIC AND ANAEROBIC Blood Culture adequate volume Performed at [facility], [address] [place], [place] [address]    Culture   Final    NO GROWTH 3 DAYS Performed at [facility], [address] [place], [place] [address]    Report Status PENDING  Incomplete  MRSA PCR Screening     Status: None   Collection Time: 08/28/18  2:10 AM  Result Value Ref Range Status   MRSA by PCR NEGATIVE NEGATIVE Final    Comment:        The GeneXpert MRSA Assay (FDA approved for NASAL specimens only), is one component of a comprehensive MRSA colonization surveillance program. It is not intended to diagnose MRSA infection nor to guide or monitor treatment for MRSA infections. Performed at [facility], [address], [place], [place] [address]          Radiology  Studies: No results found.      Scheduled Meds: . amLODipine  10 mg Oral Daily  . chlorhexidine  15 mL Mouth Rinse BID  . clindamycin  300 mg Oral QID  . enoxaparin (LOVENOX) injection  100 mg Subcutaneous Q24H  . furosemide  80 mg Intravenous BID  . lisinopril  5 mg Oral Daily  . mouth rinse  15 mL Mouth Rinse q12n4p  . nystatin cream   Topical BID  . potassium chloride  20 mEq Oral BID  . pregabalin  25 mg Oral TID  . sodium chloride flush  3 mL Intravenous Q12H   Continuous Infusions: . sodium chloride 250 mL (08/29/18 1114)  . fluconazole (DIFLUCAN) IV 200 mg (08/30/18 1015)     LOS: 2 days      Alwyn Ren, MD [facility]  If 7PM-7AM, please contact night-coverage [email or web address] Password [facility] 08/30/2018, 11:41 AM

## 2018-08-30 NOTE — Progress Notes (Signed)
Patient placed back on BiPAP due to lethargy; ABG drawn and attending hospitalist aware of results.Patient resting comfortably on BiPAP. RT will continue to monitor patient.

## 2018-08-30 NOTE — Progress Notes (Signed)
SATURATION QUALIFICATIONS: (This note is used to comply with regulatory documentation for home oxygen)  Patient Saturations on Room Air at Rest =47%  Patient Saturations on Room Air while Ambulating =   Patient Saturations on  6 Liters of oxygen while Ambulating = Unable to ambulate pt.  Please briefly explain why patient needs home oxygen: Pt did not tolerate laying in bed with oxygen removed. O2 saturations dropped to 47% on room air laying in bed. Pt placed on 6L [place] and saturations improved. No further testing completed because pt not able to tolerate.

## 2018-08-30 NOTE — Care Management Note (Addendum)
Case Management Note  Patient Details  Name: Andre [name] MRN: [id number] Date of Birth: [birth year]  Subjective/Objective:    Admitted with acute on chronic hypoxic resp failure s/t morbid obesity, HTN, obesity hypoventilation syndrome, has CPAP and oxygen at home                Action/Plan: Spoke to pt at bedside. States his oxygen has not been working for [employer]. He is unsure when it stopped working but has not reached out to [facility]. Contacted [facility] and pt has high flow concentrator at home. Will work out with [facility] and mother, having [facility] check oxygen concentrator prior to his dc home. Pt gave permission to contact mother, Andre [name] [phone]. Left vm on her phone for return call.   08/30/2018 116 pm Received call from [facility] and they put in order for switch out of oxygen concentrator with mother as contact. Waiting for call back from Mother.    Expected Discharge Date:  08/30/18               Expected Discharge Plan:  Home w Home Health Services  In-House Referral:  NA  Discharge planning Services  CM Consult  Post Acute Care Choice:    Choice offered to:     DME Arranged:  Other see comment DME Agency:  [facility]  HH Arranged:    HH Agency:     Status of Service:  In process, will continue to follow  If discussed at Long Length of Stay Meetings, dates discussed:    Additional Comments:  Elliot Cousin, RN 08/30/2018, 12:08 PM

## 2018-08-31 ENCOUNTER — Inpatient Hospital Stay (HOSPITAL_COMMUNITY): Payer: Medicare Other

## 2018-08-31 ENCOUNTER — Encounter (HOSPITAL_COMMUNITY): Payer: Self-pay | Admitting: Pulmonary Disease

## 2018-08-31 DIAGNOSIS — J9621 Acute and chronic respiratory failure with hypoxia: Principal | ICD-10-CM

## 2018-08-31 LAB — BLOOD GAS, ARTERIAL
Acid-Base Excess: 23.7 mmol/L — ABNORMAL HIGH (ref 0.0–2.0)
Acid-Base Excess: 22 mmol/L — ABNORMAL HIGH (ref 0.0–2.0)
Bicarbonate: 55.1 mmol/L — ABNORMAL HIGH (ref 20.0–28.0)
Bicarbonate: 52.4 mmol/L — ABNORMAL HIGH (ref 20.0–28.0)
Delivery systems: POSITIVE
Drawn by: 441261
Drawn by: 441261
Expiratory PAP: 8
FIO2: 30
Inspiratory PAP: 20
O2 SAT: 88.2 %
O2 Saturation: 93.4 %
PATIENT TEMPERATURE: 98.6
PO2 ART: 56.3 mmHg — AB (ref 83.0–108.0)
Patient temperature: 98.6
pCO2 arterial: 104 mmHg (ref 32.0–48.0)
pCO2 arterial: 91.8 mmHg (ref 32.0–48.0)
pH, Arterial: 7.342 — ABNORMAL LOW (ref 7.350–7.450)
pH, Arterial: 7.375 (ref 7.350–7.450)
pO2, Arterial: 70.9 mmHg — ABNORMAL LOW (ref 83.0–108.0)

## 2018-08-31 LAB — BASIC METABOLIC PANEL
BUN: 13 mg/dL (ref 6–20)
CO2: >50 mmol/L — ABNORMAL HIGH (ref 22–32)
Calcium: 8.7 mg/dL — ABNORMAL LOW (ref 8.9–10.3)
Chloride: 82 mmol/L — ABNORMAL LOW (ref 98–111)
Creatinine, Ser: 0.44 mg/dL — ABNORMAL LOW (ref 0.61–1.24)
GFR calc Af Amer: >60 mL/min (ref >60)
GFR calc non Af Amer: >60 mL/min (ref >60)
Glucose, Bld: 81 mg/dL (ref 70–99)
Potassium: 4.1 mmol/L (ref 3.5–5.1)
Sodium: 140 mmol/L (ref 135–145)

## 2018-08-31 MED ORDER — FUROSEMIDE 10 MG/ML IJ SOLN
120.0000 mg | Freq: Two times a day (BID) | INTRAVENOUS | Status: DC
  Administered 2018-08-31 – 2018-09-01 (×2): 120 mg via INTRAVENOUS
  Filled 2018-08-31: qty 12
  Filled 2018-08-31 (×2): qty 2

## 2018-08-31 NOTE — Progress Notes (Signed)
PROGRESS NOTE    Andre BridgemanDouglas L [name]  UXL:[id number] DOB: 12-Nov-1980 DOA: 09/23/18 PCP: Andre [name], Philip, MD   Brief Narrative:38 y.o.malewith medical history significant ofmorbid obesity, chronic lymphedema and panniculitis, diastolic congestive heart failure, obstructive sleep apnea and hypo-ventilation syndrome requiring CPAP nightly, hypertension comes in because of shortness of breath. Patient is compliant with his medications. He was started several days ago on clindamycin for presumptive cellulitis. His girlfriend states that his swelling is no worse than normal and she reports no change in the chronic changes and redness to his legs. His scrotum is always swollen. He does not weigh himself at home. He has not had any fevers. Comes in with shortness of breath but they do feel he is more swollen than normal. He has not had any URI symptoms or any respiratory symptoms except shortness of breath. He has chronic lymphedema particularly in his left leg and in his panniculus and scrotum. Patient is being referred for admission for acute on proximal respiratory failure he was initially oxygen sats of 80% on arrival.  Assessment & Plan:   Principal Problem:   Acute on chronic respiratory failure with hypoxia (HCC) Active Problems:   Essential hypertension   Acute on chronic diastolic CHF (congestive heart failure) (HCC)   Obesity hypoventilation syndrome (HCC)   Lymphedema  #1 acute on chronic hypoxic respiratory failure-secondary to morbid obesity hypoventilation syndrome acute on chronic diastolic heart failure exacerbation. increase Lasix to 120 mg,patient still with volume overload renal function stable.  Echo with normal ejection fraction but diastolic dysfunction.  ABG with severe respiratory acidosis though slightly better than last night.pco2 104. PCCM Consulted and appreciated.dw pccm BIPAP settings being adjusted.  #2 hypertension she remains elevated on medications  increase Lasix  #3 scrotal edema and swelling with itching  secondary to Candida we will start Diflucan nystatin and Benadryl cream.consulted urology for foley.unsure if hes emptying the bladder completely.   DVT prophylaxis:Lovenox  code Status:Full code Family Communication:No family at present Disposition PlanHome when stable     Estimated body mass index is 97.97 kg/m as calculated from the following:   Height as of this encounter: 5\' 6"  (1.676 m).   Weight as of this encounter: 275.3 kg.    Subjective: Resting with bipap on   Objective: Vitals:   08/31/18 0600 08/31/18 0758 08/31/18 0815 08/31/18 0820  BP: (!) 148/80  (!) 167/93   Pulse: 78 79 78 (!) 176  Resp: 17 (!) 22 19 20   Temp:   (!) 96.5 F (35.8 C)   TempSrc:   Axillary   SpO2: 97% 91% (!) 87% 95%  Weight:      Height:        Intake/Output Summary (Last 24 hours) at 08/31/2018 0955 Last data filed at 08/31/2018 0810 Gross per 24 hour  Intake 415.95 ml  Output 401 ml  Net 14.95 ml   Filed Weights   04/15/19 1939 08/29/18 2300  Weight: (!) 226.8 kg (!) 275.3 kg    Examination:  General exam: Appears calm and comfortable  Respiratory system: decrease breath sounds to auscultation. Respiratory effort normal. Cardiovascular system: S1 & S2 heard, RRR. No JVD, murmurs, rubs, gallops or clicks. Gastrointestinal system: Abdomen is nondistended, soft and nontender. No organomegaly or masses felt. Normal bowel sounds heard.pannus . Central nervous system: Alert and oriented. No focal neurological deficits. Extremities: 2 plus edema. Skin: No rashes, lesions or ulcers   Data Reviewed: I have personally reviewed following labs and imaging studies  CBC: Recent  Labs  Lab 09-15-2018 2229 08/28/18 0247 08/30/18 0857  WBC 10.6* 9.8 6.9  NEUTROABS 7.3  --   --   HGB 13.1 13.3 12.5*  HCT 46.0 48.2 46.5  MCV 94.5 93.4 99.1  PLT 219 217 159   Basic Metabolic Panel: Recent Labs  Lab  2018-09-15 2229 08/28/18 0247 08/29/18 1107 08/30/18 0857  NA 138 138 138 139  K 3.5 3.6 4.7 4.4  CL 94* 94* 91* 88*  CO2 36* 36* 38* 47*  GLUCOSE 110* 112* 111* 97  BUN 14 13 14 13   CREATININE 0.76 0.73 0.68 0.49*  CALCIUM 8.2* 8.1* 8.1* 8.4*   GFR: Estimated Creatinine Clearance: 262.8 mL/min (A) (by C-G formula based on SCr of 0.49 mg/dL (L)). Liver Function Tests: Recent Labs  Lab 09/15/18 2229 08/30/18 0857  AST 17 14*  ALT 17 13  ALKPHOS 97 111  BILITOT 1.2 1.0  PROT 7.6 7.7  ALBUMIN 2.7* 2.9*   No results for input(s): LIPASE, AMYLASE in the last 168 hours. No results for input(s): AMMONIA in the last 168 hours. Coagulation Profile: No results for input(s): INR, PROTIME in the last 168 hours. Cardiac Enzymes: No results for input(s): CKTOTAL, CKMB, CKMBINDEX, TROPONINI in the last 168 hours. BNP (last 3 results) No results for input(s): PROBNP in the last 8760 hours. HbA1C: No results for input(s): HGBA1C in the last 72 hours. CBG: No results for input(s): GLUCAP in the last 168 hours. Lipid Profile: No results for input(s): CHOL, HDL, LDLCALC, TRIG, CHOLHDL, LDLDIRECT in the last 72 hours. Thyroid Function Tests: No results for input(s): TSH, T4TOTAL, FREET4, T3FREE, THYROIDAB in the last 72 hours. Anemia Panel: No results for input(s): VITAMINB12, FOLATE, FERRITIN, TIBC, IRON, RETICCTPCT in the last 72 hours. Sepsis Labs: Recent Labs  Lab 09/15/18 2229  LATICACIDVEN 1.3    Recent Results (from the past 240 hour(s))  Culture, blood (routine x 2)     Status: None (Preliminary result)   Collection Time: 15-Sep-2018 10:30 PM  Result Value Ref Range Status   Specimen Description   Final    BLOOD RIGHT HAND Performed at [facility], [address] [place], [place] [address]    Special Requests   Final    BOTTLES DRAWN AEROBIC ONLY Blood Culture results may not be optimal due to an inadequate volume of blood received in culture  bottles Performed at [facility], [address] [place], [place] [address]    Culture   Final    NO GROWTH 3 DAYS Performed at [facility], [address] [place], [place] [address]    Report Status PENDING  Incomplete  Culture, blood (routine x 2)     Status: None (Preliminary result)   Collection Time: 2018/09/15 10:30 PM  Result Value Ref Range Status   Specimen Description   Final    BLOOD LEFT HAND Performed at [facility], [address], [place], [place] [address]    Special Requests   Final    BOTTLES DRAWN AEROBIC AND ANAEROBIC Blood Culture adequate volume Performed at [facility], [address], [place], [place] [address]    Culture   Final    NO GROWTH 3 DAYS Performed at [facility], [address] [place], [place] [address]    Report Status PENDING  Incomplete  MRSA PCR Screening     Status: None   Collection Time: 08/28/18  2:10 AM  Result Value Ref  Range Status   MRSA by PCR NEGATIVE NEGATIVE Final    Comment:        The GeneXpert MRSA Assay (FDA approved for NASAL specimens only), is one component of a comprehensive MRSA colonization surveillance program. It is not intended to diagnose MRSA infection nor to guide or monitor treatment for MRSA infections. Performed at [facility], [address] [place], [place] [address]          Radiology Studies: Dg Chest 1 View  Result Date: 08/30/2018 CLINICAL DATA:  Hypoxia. Chronic respiratory failure, H/o CHF, HTN. Smoker. EXAM: CHEST  1 VIEW COMPARISON:  Chest x-rays dated 08/25/2018 and 01/23/2018 FINDINGS: Stable cardiomegaly. Overall cardiomediastinal silhouette appears stable in size and configuration. There is increased central pulmonary vascular congestion and bilateral interstitial edema. Probable small RIGHT pleural effusion. No pneumothorax appreciated. Osseous structures about the chest are unremarkable.  IMPRESSION: 1. Cardiomegaly with central pulmonary vascular congestion and bilateral interstitial edema, consistent with CHF/volume overload and indicative of worsening fluid status. 2. Probable small RIGHT pleural effusion. Electronically Signed   By: Bary Richard M.D.   On: 08/30/2018 15:08        Scheduled Meds: . amLODipine  10 mg Oral Daily  . chlorhexidine  15 mL Mouth Rinse BID  . enoxaparin (LOVENOX) injection  100 mg Subcutaneous Q24H  . fluconazole  200 mg Oral Daily  . lisinopril  5 mg Oral Daily  . mouth rinse  15 mL Mouth Rinse q12n4p  . nystatin cream   Topical BID  . potassium chloride  20 mEq Oral BID  . pregabalin  25 mg Oral TID  . sodium chloride flush  3 mL Intravenous Q12H   Continuous Infusions: . sodium chloride Stopped (08/29/18 1238)  . furosemide Stopped (08/30/18 1808)     LOS: 3 days       Alwyn Ren, MD [facility]  If 7PM-7AM, please contact night-coverage [email or web address] Password [facility] 08/31/2018, 9:55 AM

## 2018-08-31 NOTE — Progress Notes (Signed)
Due to mask leak > 100%- RT switched PT back to FFM XL (current leak 7).

## 2018-08-31 NOTE — Consult Note (Signed)
NAME:  Andre [name], MRN:  [id number], DOB:  11-30-1980, LOS: 3 ADMISSION DATE:  09/14/18, CONSULTATION DATE: 08/31/2018 REFERRING MD: Ashley Royalty, CHIEF COMPLAINT: Hypoxemic respiratory failure  Brief History   Patient was admitted with shortness of breath Currently being treated for hypercapnic respiratory failure, obesity hypoventilation History of respiratory failure, obstructive sleep apnea for which he is on CPAP at home Recent treatment for cellulitis  History of present illness   Recent treatment for cellulitis Progression of symptoms with progressive shortness of breath led to presenting to the hospital for further evaluation, hypoxemic on presentation  His history is significant for Chronic history of obesity hypoventilation Obstructive sleep apnea-on CPAP therapy Panniculitis Recent cellulitis for which he was started on clindamycin History of congestive heart failure Super obese  Past Medical History   Past Medical History:  Diagnosis Date  . Anemia    iron deficient  . Cellulitis    lower extremities  . Cellulitis of scrotum    History of  . CHF (congestive heart failure) (HCC)   . History of acute bronchitis with bronchospasm   . Hypertension   . Morbid obesity (HCC)   . Obesity hypoventilation syndrome (HCC)   . Obstructive sleep apnea   . Panniculitis    History of  . Sleep apnea    Significant Hospital Events   Currently on BiPAP ABG with hypercapnic respiratory failure  Consults:  PCCM 08/31/2018  Procedures:    Significant Diagnostic Tests:  ABG-PCO2 in the 100s  Micro Data:  Blood cultures on 09-14-2018-negative to date MRSA by PCR negative  Antimicrobials:  Diflucan p.o.  Interim history/subjective:  Lethargy Desaturates without supplementation  Objective   Blood pressure (!) 167/93, pulse (!) 176, temperature (!) 96.5 F (35.8 C), temperature source Axillary, resp. rate 20, height 5\' 6"  (1.676 m), weight (!) 275.3 kg, SpO2  95 %.    FiO2 (%):  [40 %-50 %] 50 %   Intake/Output Summary (Last 24 hours) at 08/31/2018 0944 Last data filed at 08/31/2018 0810 Gross per 24 hour  Intake 415.95 ml  Output 401 ml  Net 14.95 ml   Filed Weights   2018/09/14 1939 08/29/18 2300  Weight: (!) 226.8 kg (!) 275.3 kg   Examination: General: Morbidly obese, follows commands, HENT: Has mask on at present Lungs: Poor air entry bilaterally Cardiovascular: S1-S2 appreciated, distant Abdomen: Obese Extremities: Skin changes of chronic venous stasis Neuro: Arousable, lethargy  Resolved Hospital Problem list     Assessment & Plan:  Acute on chronic diastolic heart failure -Echo did reveal normal systolic function, no dilatation of the right side of the heart -Could not assess right pulmonary pressures  Acute on chronic respiratory failure -Significant elevation of his CO2 -Likely related to his obesity hypoventilation -Does have a prior history of sleep apnea for which he is on CPAP at home -Query compliance  Obesity hypoventilation -Based on his ABG, body habitus  Cellulitis -Was on clindamycin at home  Lymphedema  Plan: We will continue BiPAP-change settings to 20/8, titrate FiO2 to only keep saturations just above 90  Continue diuresis with Lasix  Periodically repeat blood gases -Keeping SPO2 in the low normal range may help with flushing out his CO2 -Seems to be pulling adequate volumes at present -We will continue to monitor closely   Patient will benefit from being transitioned to trilogy ventilator to assure adequate volumes nocturnally Although CPAP will treat is obstructive sleep apnea, his morbid obesity and nocturnal hypoventilation will continue to contribute to  increased morbidity  Best practice:  Diet: N.p.o. at present Pain/Anxiety/Delirium protocol (if indicated): Motrin as needed VAP protocol (if indicated): Not indicated DVT prophylaxis: LOVENOX  GI prophylaxis: Not indicated Glucose  control:  Mobility: Bedrest Code Status: Full code Family Communication: No family at bedside Disposition: Will remain in ICU at present, risk of decompensation is high  Labs   CBC: Recent Labs  Lab 08/28/2018 2229 08/28/18 0247 08/30/18 0857  WBC 10.6* 9.8 6.9  NEUTROABS 7.3  --   --   HGB 13.1 13.3 12.5*  HCT 46.0 48.2 46.5  MCV 94.5 93.4 99.1  PLT 219 217 159    Basic Metabolic Panel: Recent Labs  Lab 08/31/2018 2229 08/28/18 0247 08/29/18 1107 08/30/18 0857  NA 138 138 138 139  K 3.5 3.6 4.7 4.4  CL 94* 94* 91* 88*  CO2 36* 36* 38* 47*  GLUCOSE 110* 112* 111* 97  BUN 14 13 14 13   CREATININE 0.76 0.73 0.68 0.49*  CALCIUM 8.2* 8.1* 8.1* 8.4*   GFR: Estimated Creatinine Clearance: 262.8 mL/min (A) (by C-G formula based on SCr of 0.49 mg/dL (L)). Recent Labs  Lab 08/26/2018 2229 08/28/18 0247 08/30/18 0857  WBC 10.6* 9.8 6.9  LATICACIDVEN 1.3  --   --    Liver Function Tests: Recent Labs  Lab 09/09/2018 2229 08/30/18 0857  AST 17 14*  ALT 17 13  ALKPHOS 97 111  BILITOT 1.2 1.0  PROT 7.6 7.7  ALBUMIN 2.7* 2.9*   No results for input(s): LIPASE, AMYLASE in the last 168 hours. No results for input(s): AMMONIA in the last 168 hours.  ABG    Component Value Date/Time   PHART 7.342 (L) 08/31/2018 0730   PCO2ART 104 (HH) 08/31/2018 0730   PO2ART 70.9 (L) 08/31/2018 0730   HCO3 55.1 (H) 08/31/2018 0730   TCO2 37.3 06/27/2011 0628   O2SAT 93.4 08/31/2018 0730     Coagulation Profile: No results for input(s): INR, PROTIME in the last 168 hours.  Cardiac Enzymes: No results for input(s): CKTOTAL, CKMB, CKMBINDEX, TROPONINI in the last 168 hours.  HbA1C: Hgb A1c MFr Bld  Date/Time Value Ref Range Status  10/07/2008 07:45 PM  4.6 - 6.1 % Final   5.4 (NOTE)   The ADA recommends the following therapeutic goal for glycemic   control related to Hgb A1C measurement:   Goal of Therapy:   < 7.0% Hgb A1C   Reference: American Diabetes Association: Clinical  Practice   Recommendations 2008, Diabetes Care,  2008, 31:(Suppl 1).  12/27/2006 05:24 PM (H)  Final   6.2 (NOTE)   The ADA recommends the following therapeutic goals for glycemic   control related to Hgb A1C measurement:   Goal of Therapy:   < 7.0% Hgb A1C   Action Suggested:  > 8.0% Hgb A1C   Ref:  Diabetes Care, 22, Suppl. 1, 1999    CBG: No results for input(s): GLUCAP in the last 168 hours.  Review of Systems:   Review of Systems  Unable to perform ROS: Medical condition  Neurological: Positive for weakness.  Not very interactive at present '  Past Medical History  He,  has a past medical history of Anemia, Cellulitis, Cellulitis of scrotum, CHF (congestive heart failure) (HCC), History of acute bronchitis with bronchospasm, Hypertension, Morbid obesity (HCC), Obesity hypoventilation syndrome (HCC), Obstructive sleep apnea, Panniculitis, and Sleep apnea.   Surgical History   History reviewed. No pertinent surgical history.   Social History   reports that he  has been smoking. He has a 9.00 pack-year smoking history. He has never used smokeless tobacco. He reports current alcohol use. He reports that he does not use drugs.   Family History   His family history includes Asthma in his mother; Cancer in his father; Hypertension in an other family member.   Allergies Allergies  Allergen Reactions  . Other Hives    Steroid, but cannot remember name.     Home Medications  Prior to Admission medications   Medication Sig Start Date End Date Taking? Authorizing Provider  clindamycin (CLEOCIN) 150 MG capsule Take 300 mg by mouth 4 (four) times daily. Take 300mg  by mouth four times daily 08/19/18  Yes [provider]  furosemide (LASIX) 40 MG tablet Take 1 tablet (40 mg total) by mouth daily. 06/23/18  Yes Antony Madura, PA-C  ibuprofen (ADVIL,MOTRIN) 200 MG tablet Take 600 mg by mouth every 6 (six) hours as needed for moderate pain.   Yes [provider]  lisinopril  (PRINIVIL,ZESTRIL) 5 MG tablet Take 1 tablet (5 mg total) by mouth daily. 06/23/18  Yes Antony Madura, PA-C  albuterol (PROVENTIL HFA;VENTOLIN HFA) 108 (90 Base) MCG/ACT inhaler Inhale 2 puffs into the lungs every 6 (six) hours as needed for wheezing or shortness of breath. Patient not taking: Reported on 06/13/2018 01/25/18   Briant Cedar, MD  amLODipine (NORVASC) 10 MG tablet Take 1 tablet (10 mg total) by mouth daily. Patient not taking: Reported on 06/22/2018 01/25/18 02/24/18  Briant Cedar, MD  diphenhydrAMINE-zinc acetate (BENADRYL) cream Apply topically 3 (three) times daily as needed for itching. 08/30/18   Alwyn Ren, MD  fluconazole (DIFLUCAN) 100 MG tablet Take 1 tablet (100 mg total) by mouth daily for 5 days. 08/30/18 09/04/18  Alwyn Ren, MD  furosemide (LASIX) 40 MG tablet Take 2 tablets (80 mg total) by mouth 2 (two) times daily for 30 days. 08/30/18 09/29/18  Alwyn Ren, MD  nystatin cream (MYCOSTATIN) Apply topically 2 (two) times daily. 08/30/18   Alwyn Ren, MD  potassium chloride SA (K-DUR,KLOR-CON) 20 MEQ tablet Take 1 tablet (20 mEq total) by mouth 2 (two) times daily. 08/30/18   Alwyn Ren, MD  pregabalin (LYRICA) 25 MG capsule Take 25 mg by mouth 3 (three) times daily. 08/05/18   [provider]     Critical care time: 35 minutes, spent evaluating patient, reviewing records, formulating plan of care, noninvasive ventilator adjustments Risk of decompensation is very high

## 2018-08-31 NOTE — Progress Notes (Signed)
RT called by bedside RN for patient desaturating. RT attempted to adjust BiPAP to maintain O2 sats above 90%. Patient now on 70%. When patient is asleep Vt will decrease from 1200 mL to 400-550 mL on IPAP 20 and EPAP 8. Patient set RR 12 and total RR 12 on BiPAP. Attending MD aware. RT will continue to monitor patient.

## 2018-08-31 NOTE — Progress Notes (Signed)
PT Cancellation Note  Patient Details Name: Andre [name] MRN: [id number] DOB: 10-Oct-1980   Cancelled Treatment:    Reason Eval/Treat Not Completed: Medical issues which prohibited therapy Pulmonary care to see pt today.  Pt desats with no oxygen (keeps removing O2 [place]) per RN so pt currently on BiPAP.   Damian Buckles,KATHrine E 08/31/2018, 9:27 AM Zenovia Jarred, PT, DPT Acute Rehabilitation Services Office: [phone] Pager: [phone]

## 2018-08-31 NOTE — Consult Note (Signed)
Subjective: CC:  Difficult foley placement.  Hx: Mr. Plano is a 63lb male who is admitted with exacerbation of CHF and SOB.  I was asked to see him in consultation by Dr. Jerolyn Center for foley placement at the request of nursing for skin hygeine.   The patient is voiding but because of penile retraction is not able to use a urinal or condom catheter.   Attempt by nursing staff was unsuccessful.  ROS:  Review of Systems  Unable to perform ROS: Patient nonverbal    Allergies  Allergen Reactions  . Other Hives    Steroid, but cannot remember name.    Past Medical History:  Diagnosis Date  . Anemia    iron deficient  . Cellulitis    lower extremities  . Cellulitis of scrotum    History of  . CHF (congestive heart failure) (HCC)   . History of acute bronchitis with bronchospasm   . Hypertension   . Morbid obesity (HCC)   . Obesity hypoventilation syndrome (HCC)   . Obstructive sleep apnea   . Panniculitis    History of  . Sleep apnea     History reviewed. No pertinent surgical history.  Social History   Socioeconomic History  . Marital status: Single    Spouse name: Not on file  . Number of children: Not on file  . Years of education: Not on file  . Highest education level: Not on file  Occupational History  . Not on file  Social Needs  . Financial resource strain: Not on file  . Food insecurity:    Worry: Not on file    Inability: Not on file  . Transportation needs:    Medical: Not on file    Non-medical: Not on file  Tobacco Use  . Smoking status: Current Every Day Smoker    Packs/day: 1.00    Years: 9.00    Pack years: 9.00  . Smokeless tobacco: Never Used  Substance and Sexual Activity  . Alcohol use: Yes  . Drug use: No  . Sexual activity: Never  Lifestyle  . Physical activity:    Days per week: Not on file    Minutes per session: Not on file  . Stress: Not on file  Relationships  . Social connections:    Talks on phone: Not on file    Gets  together: Not on file    Attends religious service: Not on file    Active member of club or organization: Not on file    Attends meetings of clubs or organizations: Not on file    Relationship status: Not on file  . Intimate partner violence:    Fear of current or ex partner: Not on file    Emotionally abused: Not on file    Physically abused: Not on file    Forced sexual activity: Not on file  Other Topics Concern  . Not on file  Social History Narrative  . Not on file    Family History  Problem Relation Age of Onset  . Asthma Mother   . Cancer Father   . Hypertension Other     Anti-infectives: Anti-infectives (From admission, onward)   Start     Dose/Rate Route Frequency Ordered Stop   08/31/18 1000  fluconazole (DIFLUCAN) tablet 200 mg     200 mg Oral Daily 08/30/18 1406     08/30/18 0000  fluconazole (DIFLUCAN) 100 MG tablet     100 mg Oral Daily 08/30/18 0750 09/04/18  2359   08/29/18 1000  fluconazole (DIFLUCAN) IVPB 200 mg  Status:  Discontinued     200 mg 100 mL/hr over 60 Minutes Intravenous Every 24 hours 08/29/18 0959 08/30/18 1406   08/28/18 1000  clindamycin (CLEOCIN) capsule 300 mg  Status:  Discontinued    Note to Pharmacy:  Take 300mg  by mouth four times daily     300 mg Oral 4 times daily 08/28/18 0054 08/30/18 1405   11-02-18 2245  vancomycin (VANCOCIN) IVPB 1000 mg/200 mL premix     1,000 mg 200 mL/hr over 60 Minutes Intravenous  Once 11-02-18 2230 11-02-18 2350   11-02-18 2245  cefTRIAXone (ROCEPHIN) 1 g in sodium chloride 0.9 % 100 mL IVPB     1 g 200 mL/hr over 30 Minutes Intravenous  Once 11-02-18 2230 11-02-18 2319      Current Facility-Administered Medications  Medication Dose Route Frequency Provider Last Rate Last Dose  . 0.9 %  sodium chloride infusion  250 mL Intravenous PRN Haydee Monicaavid, Rachal A, MD   Stopped at 08/29/18 1238  . albuterol (PROVENTIL) (2.5 MG/3ML) 0.083% nebulizer solution 2.5 mg  2.5 mg Nebulization Q6H PRN Haydee Monicaavid, Rachal A, MD    2.5 mg at 08/29/18 1945  . amLODipine (NORVASC) tablet 10 mg  10 mg Oral Daily Haydee Monicaavid, Rachal A, MD   Stopped at 08/31/18 1124  . chlorhexidine (PERIDEX) 0.12 % solution 15 mL  15 mL Mouth Rinse BID Haydee Monicaavid, Rachal A, MD   15 mL at 08/30/18 2104  . diphenhydrAMINE-zinc acetate (BENADRYL) 2-0.1 % cream   Topical TID PRN Alwyn RenMathews, Elizabeth G, MD      . enoxaparin (LOVENOX) injection 100 mg  100 mg Subcutaneous Q24H Tarry Kosavid, Rachal A, MD   100 mg at 08/31/18 1138  . fluconazole (DIFLUCAN) tablet 200 mg  200 mg Oral Daily Alwyn RenMathews, Elizabeth G, MD   Stopped at 08/31/18 1124  . furosemide (LASIX) 120 mg in dextrose 5 % 50 mL IVPB  120 mg Intravenous BID Alwyn RenMathews, Elizabeth G, MD      . ibuprofen (ADVIL,MOTRIN) tablet 600 mg  600 mg Oral Q6H PRN Haydee Monicaavid, Rachal A, MD      . lisinopril (PRINIVIL,ZESTRIL) tablet 5 mg  5 mg Oral Daily Haydee Monicaavid, Rachal A, MD   Stopped at 08/31/18 1125  . MEDLINE mouth rinse  15 mL Mouth Rinse q12n4p Tarry Kosavid, Rachal A, MD   15 mL at 08/30/18 1400  . nystatin cream (MYCOSTATIN)   Topical BID Alwyn RenMathews, Elizabeth G, MD      . potassium chloride SA (K-DUR,KLOR-CON) CR tablet 20 mEq  20 mEq Oral BID Alwyn RenMathews, Elizabeth G, MD   Stopped at 08/31/18 1126  . pregabalin (LYRICA) capsule 25 mg  25 mg Oral TID Haydee Monicaavid, Rachal A, MD   Stopped at 08/31/18 1126  . sodium chloride flush (NS) 0.9 % injection 3 mL  3 mL Intravenous Q12H Tarry Kosavid, Rachal A, MD   3 mL at 08/30/18 2105  . sodium chloride flush (NS) 0.9 % injection 3 mL  3 mL Intravenous PRN Haydee Monicaavid, Rachal A, MD         Objective: Vital signs in last 24 hours: Temp:  [96.5 F (35.8 C)-98.6 F (37 C)] 97.5 F (36.4 C) (02/17 1558) Pulse Rate:  [72-88] 86 (02/17 1600) Resp:  [14-25] 21 (02/17 1600) BP: (119-167)/(52-93) 138/85 (02/17 1600) SpO2:  [87 %-98 %] 93 % (02/17 1600) FiO2 (%):  [30 %-60 %] 60 % (02/17 1510)  Intake/Output from previous day: 02/16  0701 - 02/17 0700 In: 266 [I.V.:6; IV Piggyback:260] Out: 401  [Urine:401] Intake/Output this shift: Total I/O In: 639.9 [I.V.:580; IV Piggyback:59.9] Out: 1200 [Urine:1200]   Physical Exam Vitals signs reviewed.  Constitutional:      Comments: Morbidly obese  Pulmonary:     Effort: Tachypnea present.     Comments: With a BPAP.  Abdominal:     Comments: Obese with a large woody paniculus.    Genitourinary:    Comments: Chronic scrotal edema with venous stasis changes and a buried penis.   Musculoskeletal:     Right lower leg: Edema present.     Left lower leg: Edema present.     Lab Results:  Recent Labs    08/30/18 0857  WBC 6.9  HGB 12.5*  HCT 46.5  PLT 159   BMET Recent Labs    08/30/18 0857 08/31/18 0855  NA 139 140  K 4.4 4.1  CL 88* 82*  CO2 47* >50*  GLUCOSE 97 81  BUN 13 13  CREATININE 0.49* 0.44*  CALCIUM 8.4* 8.7*   PT/INR No results for input(s): LABPROT, INR in the last 72 hours. ABG Recent Labs    08/31/18 0730 08/31/18 1106  PHART 7.342* 7.375  HCO3 55.1* 52.4*    Studies/Results: Dg Chest 1 View  Result Date: 08/31/2018 CLINICAL DATA:  Hypoxemia. EXAM: CHEST  1 VIEW COMPARISON:  08/30/2018 FINDINGS: Cardiac silhouette is mildly enlarged. There is vascular congestion with opacity central to lower lungs similar to the previous day's exam, greater at the right base. No convincing pneumonia. IMPRESSION: 1. No significant change from the prior day's study. Cardiomegaly with vascular congestion and mild hazy airspace opacities consistent with congestive heart failure. No new abnormalities. Electronically Signed   By: Amie Portland M.D.   On: 08/31/2018 15:04   Dg Chest 1 View  Result Date: 08/30/2018 CLINICAL DATA:  Hypoxia. Chronic respiratory failure, H/o CHF, HTN. Smoker. EXAM: CHEST  1 VIEW COMPARISON:  Chest x-rays dated 08/31/2018 and 01/23/2018 FINDINGS: Stable cardiomegaly. Overall cardiomediastinal silhouette appears stable in size and configuration. There is increased central pulmonary vascular  congestion and bilateral interstitial edema. Probable small RIGHT pleural effusion. No pneumothorax appreciated. Osseous structures about the chest are unremarkable. IMPRESSION: 1. Cardiomegaly with central pulmonary vascular congestion and bilateral interstitial edema, consistent with CHF/volume overload and indicative of worsening fluid status. 2. Probable small RIGHT pleural effusion. Electronically Signed   By: Bary Richard M.D.   On: 08/30/2018 15:08   Procedure:   Genitalia was prepped with betadine and draped with sterile towels.  An initial attempt to locate the glans with digital examination was unsuccessful.  The flexible cystoscope was then passed for an attempt to place a wire across the urethra to guide a foley but I was never able to identify the meatus.   He voided a generous amount of clear urine just prior to insertion of the scope so it was felt that a Purewick device would be the best option to keep him dry.   Assessment: Buried penis with difficult foley placement.   He voided during my attempt and I felt he would be best managed with a Purewick system.   Reconsult prn.    CC: Dr. Jerelene Redden.      Bjorn Pippin 08/31/2018 [phone]

## 2018-08-31 NOTE — Progress Notes (Signed)
ABG drawn on patient. Upon arrival, BiPAP was removed by patient and he was placed on 6 L [place]. RT unaware how long patient was off BiPAP prior to ABG. Per nightshift RT, patient wore BiPAP all night. Attending MD made aware of critical value on ABG. RT will continue to monitor patient; ABG results in chart.

## 2018-09-01 ENCOUNTER — Inpatient Hospital Stay (HOSPITAL_COMMUNITY): Payer: Medicare Other | Admitting: Anesthesiology

## 2018-09-01 ENCOUNTER — Inpatient Hospital Stay (HOSPITAL_COMMUNITY): Payer: Medicare Other

## 2018-09-01 ENCOUNTER — Encounter (HOSPITAL_COMMUNITY): Payer: Self-pay | Admitting: Radiology

## 2018-09-01 DIAGNOSIS — I10 Essential (primary) hypertension: Secondary | ICD-10-CM

## 2018-09-01 DIAGNOSIS — I469 Cardiac arrest, cause unspecified: Secondary | ICD-10-CM

## 2018-09-01 LAB — BLOOD GAS, ARTERIAL
ACID-BASE EXCESS: 16.4 mmol/L — AB (ref 0.0–2.0)
Acid-Base Excess: 23.3 mmol/L — ABNORMAL HIGH (ref 0.0–2.0)
Acid-Base Excess: 20.6 mmol/L — ABNORMAL HIGH (ref 0.0–2.0)
Bicarbonate: 53.4 mmol/L — ABNORMAL HIGH (ref 20.0–28.0)
Bicarbonate: 44.6 mmol/L — ABNORMAL HIGH (ref 20.0–28.0)
Bicarbonate: 45.5 mmol/L — ABNORMAL HIGH (ref 20.0–28.0)
Delivery systems: POSITIVE
Drawn by: 11249
Drawn by: 331471
Drawn by: 331761
Expiratory PAP: 12
FIO2: 70
FIO2: 100
FIO2: 100
Inspiratory PAP: 20
MECHVT: 510 mL
MECHVT: 580 mL
Mode: POSITIVE
O2 Saturation: 97.4 %
O2 Saturation: 93.1 %
O2 Saturation: 95.4 %
PATIENT TEMPERATURE: 98.6
PEEP/CPAP: 5 cmH2O
PEEP: 10 cmH2O
PO2 ART: 67.9 mmHg — AB (ref 83.0–108.0)
Patient temperature: 98.6
Patient temperature: 94.1
RATE: 30 resp/min
RATE: 30 resp/min
pCO2 arterial: 87.2 mmHg (ref 32.0–48.0)
pCO2 arterial: 69.6 mmHg (ref 32.0–48.0)
pCO2 arterial: 47.3 mmHg (ref 32.0–48.0)
pH, Arterial: 7.404 (ref 7.350–7.450)
pH, Arterial: 7.422 (ref 7.350–7.450)
pH, Arterial: 7.578 — ABNORMAL HIGH (ref 7.350–7.450)
pO2, Arterial: 93.3 mmHg (ref 83.0–108.0)
pO2, Arterial: 57.6 mmHg — ABNORMAL LOW (ref 83.0–108.0)

## 2018-09-01 LAB — BASIC METABOLIC PANEL
Anion gap: 18 — ABNORMAL HIGH (ref 5–15)
Anion gap: 13 (ref 5–15)
Anion gap: 13 (ref 5–15)
BUN: 16 mg/dL (ref 6–20)
BUN: 16 mg/dL (ref 6–20)
BUN: 17 mg/dL (ref 6–20)
BUN: 18 mg/dL (ref 6–20)
CO2: >50 mmol/L — ABNORMAL HIGH (ref 22–32)
CO2: 39 mmol/L — ABNORMAL HIGH (ref 22–32)
CO2: 41 mmol/L — ABNORMAL HIGH (ref 22–32)
CO2: 41 mmol/L — AB (ref 22–32)
Calcium: 8.9 mg/dL (ref 8.9–10.3)
Calcium: 9 mg/dL (ref 8.9–10.3)
Calcium: 8.6 mg/dL — ABNORMAL LOW (ref 8.9–10.3)
Calcium: 8.9 mg/dL (ref 8.9–10.3)
Chloride: 81 mmol/L — ABNORMAL LOW (ref 98–111)
Chloride: 83 mmol/L — ABNORMAL LOW (ref 98–111)
Chloride: 84 mmol/L — ABNORMAL LOW (ref 98–111)
Chloride: 83 mmol/L — ABNORMAL LOW (ref 98–111)
Creatinine, Ser: 0.48 mg/dL — ABNORMAL LOW (ref 0.61–1.24)
Creatinine, Ser: 0.81 mg/dL (ref 0.61–1.24)
Creatinine, Ser: 1.08 mg/dL (ref 0.61–1.24)
Creatinine, Ser: 1.15 mg/dL (ref 0.61–1.24)
GFR calc Af Amer: >60 mL/min (ref >60)
GFR calc Af Amer: >60 mL/min (ref >60)
GFR calc Af Amer: >60 mL/min (ref >60)
GFR calc Af Amer: >60 mL/min (ref >60)
GFR calc non Af Amer: >60 mL/min (ref >60)
GFR calc non Af Amer: >60 mL/min (ref >60)
Glucose, Bld: 81 mg/dL (ref 70–99)
Glucose, Bld: 108 mg/dL — ABNORMAL HIGH (ref 70–99)
Glucose, Bld: 141 mg/dL — ABNORMAL HIGH (ref 70–99)
Glucose, Bld: 122 mg/dL — ABNORMAL HIGH (ref 70–99)
Potassium: 4 mmol/L (ref 3.5–5.1)
Potassium: 3.6 mmol/L (ref 3.5–5.1)
Potassium: 3.4 mmol/L — ABNORMAL LOW (ref 3.5–5.1)
Potassium: 3.3 mmol/L — ABNORMAL LOW (ref 3.5–5.1)
SODIUM: 140 mmol/L (ref 135–145)
Sodium: 138 mmol/L (ref 135–145)
Sodium: 138 mmol/L (ref 135–145)
Sodium: 137 mmol/L (ref 135–145)

## 2018-09-01 LAB — CULTURE, BLOOD (ROUTINE X 2)
Culture: NO GROWTH
Culture: NO GROWTH
Special Requests: ADEQUATE

## 2018-09-01 LAB — GLUCOSE, CAPILLARY
Glucose-Capillary: 105 mg/dL — ABNORMAL HIGH (ref 70–99)
Glucose-Capillary: 117 mg/dL — ABNORMAL HIGH (ref 70–99)
Glucose-Capillary: 124 mg/dL — ABNORMAL HIGH (ref 70–99)
Glucose-Capillary: 127 mg/dL — ABNORMAL HIGH (ref 70–99)
Glucose-Capillary: 133 mg/dL — ABNORMAL HIGH (ref 70–99)
Glucose-Capillary: 134 mg/dL — ABNORMAL HIGH (ref 70–99)
Glucose-Capillary: 151 mg/dL — ABNORMAL HIGH (ref 70–99)

## 2018-09-01 LAB — POCT I-STAT 7, (LYTES, BLD GAS, ICA,H+H)
Acid-Base Excess: 20 mmol/L — ABNORMAL HIGH (ref 0.0–2.0)
Bicarbonate: 49.5 mmol/L — ABNORMAL HIGH (ref 20.0–28.0)
CALCIUM ION: 1.13 mmol/L — AB (ref 1.15–1.40)
HCT: 43 % (ref 39.0–52.0)
Hemoglobin: 14.6 g/dL (ref 13.0–17.0)
O2 Saturation: 100 %
Potassium: 3.2 mmol/L — ABNORMAL LOW (ref 3.5–5.1)
Sodium: 138 mmol/L (ref 135–145)
TCO2: >50 mmol/L — ABNORMAL HIGH (ref 22–32)
pCO2 arterial: 65.7 mmHg (ref 32.0–48.0)
pH, Arterial: 7.47 — ABNORMAL HIGH (ref 7.350–7.450)
pO2, Arterial: 169 mmHg — ABNORMAL HIGH (ref 83.0–108.0)

## 2018-09-01 LAB — CBC
HEMATOCRIT: 46.4 % (ref 39.0–52.0)
Hemoglobin: 12.9 g/dL — ABNORMAL LOW (ref 13.0–17.0)
MCH: 26.3 pg (ref 26.0–34.0)
MCHC: 27.8 g/dL — ABNORMAL LOW (ref 30.0–36.0)
MCV: 94.5 fL (ref 80.0–100.0)
Platelets: 152 10*3/uL (ref 150–400)
RBC: 4.91 MIL/uL (ref 4.22–5.81)
RDW: 20 % — ABNORMAL HIGH (ref 11.5–15.5)
WBC: 8.7 10*3/uL (ref 4.0–10.5)
nRBC: 0 % (ref 0.0–0.2)

## 2018-09-01 LAB — APTT: aPTT: 30 seconds (ref 24–36)

## 2018-09-01 LAB — PROTIME-INR
INR: 1.24
Prothrombin Time: 15.4 seconds — ABNORMAL HIGH (ref 11.4–15.2)

## 2018-09-01 LAB — TROPONIN I: Troponin I: 0.04 ng/mL (ref <0.03)

## 2018-09-01 MED ORDER — ARTIFICIAL TEARS OPHTHALMIC OINT
1.0000 "application " | TOPICAL_OINTMENT | Freq: Three times a day (TID) | OPHTHALMIC | Status: DC
  Administered 2018-09-01 – 2018-09-04 (×8): 1 via OPHTHALMIC
  Filled 2018-09-01 (×3): qty 3.5

## 2018-09-01 MED ORDER — MIDAZOLAM 50MG/50ML (1MG/ML) PREMIX INFUSION
2.0000 mg/h | INTRAVENOUS | Status: DC
  Administered 2018-09-01: 2 mg/h via INTRAVENOUS
  Administered 2018-09-02: 8 mg/h via INTRAVENOUS
  Administered 2018-09-02: 10 mg/h via INTRAVENOUS
  Administered 2018-09-02: 8 mg/h via INTRAVENOUS
  Administered 2018-09-03: 9.5 mg/h via INTRAVENOUS
  Filled 2018-09-01 (×13): qty 50

## 2018-09-01 MED ORDER — EPINEPHRINE PF 1 MG/10ML IJ SOSY
PREFILLED_SYRINGE | INTRAMUSCULAR | Status: AC
  Filled 2018-09-01: qty 30

## 2018-09-01 MED ORDER — MIDAZOLAM 50MG/50ML (1MG/ML) PREMIX INFUSION: 2.0000 mg/h | INTRAVENOUS | Status: DC

## 2018-09-01 MED ORDER — ACETAZOLAMIDE SODIUM 500 MG IJ SOLR
500.0000 mg | Freq: Once | INTRAMUSCULAR | Status: AC
  Administered 2018-09-01: 500 mg via INTRAVENOUS
  Filled 2018-09-01: qty 500

## 2018-09-01 MED ORDER — MIDAZOLAM HCL 2 MG/2ML IJ SOLN
4.0000 mg | Freq: Once | INTRAMUSCULAR | Status: DC
  Filled 2018-09-01: qty 4

## 2018-09-01 MED ORDER — FENTANYL CITRATE (PF) 100 MCG/2ML IJ SOLN: 100.0000 ug | Freq: Once | INTRAMUSCULAR | Status: DC

## 2018-09-01 MED ORDER — FENTANYL 2500MCG IN NS 250ML (10MCG/ML) PREMIX INFUSION
100.0000 ug/h | INTRAVENOUS | Status: DC
  Administered 2018-09-01: 100 ug/h via INTRAVENOUS
  Administered 2018-09-02: 400 ug/h via INTRAVENOUS
  Administered 2018-09-02: 300 ug/h via INTRAVENOUS
  Administered 2018-09-03: 250 ug/h via INTRAVENOUS
  Administered 2018-09-03: 125 ug/h via INTRAVENOUS
  Administered 2018-09-04: 50 ug/h via INTRAVENOUS
  Filled 2018-09-01 (×10): qty 250

## 2018-09-01 MED ORDER — HEPARIN (PORCINE) 25000 UT/250ML-% IV SOLN: 1600.0000 [IU]/h | INTRAVENOUS | Status: DC

## 2018-09-01 MED ORDER — SODIUM CHLORIDE 0.9 % IV SOLN
3.0000 g | Freq: Four times a day (QID) | INTRAVENOUS | Status: DC
  Administered 2018-09-01 – 2018-09-02 (×3): 3 g via INTRAVENOUS
  Filled 2018-09-01 (×6): qty 3

## 2018-09-01 MED ORDER — POTASSIUM CHLORIDE 10 MEQ/50ML IV SOLN
10.0000 meq | INTRAVENOUS | Status: AC
  Administered 2018-09-01 – 2018-09-02 (×4): 10 meq via INTRAVENOUS
  Filled 2018-09-01: qty 50

## 2018-09-01 MED ORDER — CISATRACURIUM BOLUS VIA INFUSION
0.1000 mg/kg | Freq: Once | INTRAVENOUS | Status: AC
  Administered 2018-09-01: 21.8 mg via INTRAVENOUS
  Filled 2018-09-01: qty 22

## 2018-09-01 MED ORDER — ASPIRIN 300 MG RE SUPP
300.0000 mg | RECTAL | Status: AC
  Administered 2018-09-01: 300 mg via RECTAL
  Filled 2018-09-01: qty 1

## 2018-09-01 MED ORDER — SODIUM CHLORIDE (PF) 0.9 % IJ SOLN
INTRAMUSCULAR | Status: AC
  Filled 2018-09-01: qty 50

## 2018-09-01 MED ORDER — NOREPINEPHRINE 4 MG/250ML-% IV SOLN
0.0000 ug/min | INTRAVENOUS | Status: DC
  Administered 2018-09-02: 5 ug/min via INTRAVENOUS
  Administered 2018-09-03: 25 ug/min via INTRAVENOUS
  Administered 2018-09-03: 10 ug/min via INTRAVENOUS
  Administered 2018-09-03: 18 ug/min via INTRAVENOUS
  Filled 2018-09-01 (×5): qty 250

## 2018-09-01 MED ORDER — SODIUM CHLORIDE 0.9 % IV SOLN
0.5000 ug/kg/min | INTRAVENOUS | Status: DC
  Administered 2018-09-01: 1 ug/kg/min via INTRAVENOUS
  Administered 2018-09-02: 8 ug/kg/min via INTRAVENOUS
  Administered 2018-09-02: 5 ug/kg/min via INTRAVENOUS
  Administered 2018-09-02: 7 ug/kg/min via INTRAVENOUS
  Administered 2018-09-02 (×2): 9 ug/kg/min via INTRAVENOUS
  Administered 2018-09-03 (×2): 10 ug/kg/min via INTRAVENOUS
  Filled 2018-09-01 (×18): qty 20

## 2018-09-01 MED ORDER — HEPARIN BOLUS VIA INFUSION
4000.0000 [IU] | Freq: Once | INTRAVENOUS | Status: DC
  Filled 2018-09-01: qty 4000

## 2018-09-01 MED ORDER — FENTANYL BOLUS VIA INFUSION
50.0000 ug | INTRAVENOUS | Status: DC | PRN
  Administered 2018-09-02 (×2): 50 ug via INTRAVENOUS
  Filled 2018-09-01: qty 50

## 2018-09-01 MED ORDER — STERILE WATER FOR INJECTION IJ SOLN
INTRAMUSCULAR | Status: AC
  Filled 2018-09-01: qty 10

## 2018-09-01 MED ORDER — FENTANYL CITRATE (PF) 100 MCG/2ML IJ SOLN
100.0000 ug | Freq: Once | INTRAMUSCULAR | Status: DC
  Filled 2018-09-01: qty 2

## 2018-09-01 MED ORDER — SODIUM CHLORIDE 0.9 % IV BOLUS: 1000.0000 mL | Freq: Once | INTRAVENOUS | Status: DC

## 2018-09-01 MED ORDER — IPRATROPIUM-ALBUTEROL 0.5-2.5 (3) MG/3ML IN SOLN
3.0000 mL | Freq: Four times a day (QID) | RESPIRATORY_TRACT | Status: DC
  Administered 2018-09-01 – 2018-09-03 (×6): 3 mL via RESPIRATORY_TRACT
  Filled 2018-09-01 (×5): qty 3
  Filled 2018-09-01: qty 39
  Filled 2018-09-01: qty 3

## 2018-09-01 MED ORDER — EPINEPHRINE PF 1 MG/10ML IJ SOSY
PREFILLED_SYRINGE | INTRAMUSCULAR | Status: AC
  Filled 2018-09-01: qty 10

## 2018-09-01 MED ORDER — MIDAZOLAM HCL 2 MG/2ML IJ SOLN
2.0000 mg | Freq: Once | INTRAMUSCULAR | Status: DC
  Filled 2018-09-01: qty 2

## 2018-09-01 MED ORDER — ENOXAPARIN SODIUM 100 MG/ML ~~LOC~~ SOLN
100.0000 mg | SUBCUTANEOUS | Status: DC
  Administered 2018-09-02: 100 mg via SUBCUTANEOUS
  Filled 2018-09-01: qty 1

## 2018-09-01 MED ORDER — MIDAZOLAM 50MG/50ML (1MG/ML) PREMIX INFUSION: 0.5000 mg/h | INTRAVENOUS | Status: DC

## 2018-09-01 MED ORDER — PANTOPRAZOLE SODIUM 40 MG IV SOLR
40.0000 mg | Freq: Every day | INTRAVENOUS | Status: DC
  Administered 2018-09-01 – 2018-09-07 (×6): 40 mg via INTRAVENOUS
  Filled 2018-09-01 (×7): qty 40

## 2018-09-01 MED ORDER — IOPAMIDOL (ISOVUE-370) INJECTION 76%
INTRAVENOUS | Status: AC
  Filled 2018-09-01: qty 100

## 2018-09-01 MED ORDER — ORAL CARE MOUTH RINSE
15.0000 mL | OROMUCOSAL | Status: DC
  Administered 2018-09-02 – 2018-09-08 (×61): 15 mL via OROMUCOSAL

## 2018-09-01 MED ORDER — CISATRACURIUM BOLUS VIA INFUSION
0.0500 mg/kg | INTRAVENOUS | Status: AC | PRN
  Administered 2018-09-02: 10.9 mg via INTRAVENOUS
  Filled 2018-09-01: qty 11

## 2018-09-01 MED ORDER — MIDAZOLAM BOLUS VIA INFUSION
2.0000 mg | INTRAVENOUS | Status: DC | PRN
  Filled 2018-09-01: qty 2

## 2018-09-01 MED ORDER — IOPAMIDOL (ISOVUE-370) INJECTION 76%
100.0000 mL | Freq: Once | INTRAVENOUS | Status: AC | PRN
  Administered 2018-09-01: 100 mL via INTRAVENOUS

## 2018-09-01 MED ORDER — EPINEPHRINE PF 1 MG/ML IJ SOLN
0.5000 ug/min | INTRAVENOUS | Status: DC
  Filled 2018-09-01: qty 4

## 2018-09-01 MED ORDER — CHLORHEXIDINE GLUCONATE 0.12% ORAL RINSE (MEDLINE KIT): 15.0000 mL | Freq: Two times a day (BID) | OROMUCOSAL | Status: DC

## 2018-09-01 NOTE — Progress Notes (Signed)
ANTICOAGULATION CONSULT NOTE - Initial Consult  Pharmacy Consult for Heparin Indication: chest pain/ACS  Allergies  Allergen Reactions  . Other Hives    Steroid, but cannot remember name.    Patient Measurements: Height: 5\' 6"  (167.6 cm) Weight: (!) 480 lb (217.7 kg)(weighed on bariatric bed. charge nurse verified weight.) IBW/kg (Calculated) : 63.8 Heparin Dosing Weight: 121 kg  (based on most recent weight 217.7 kg, 09/01/18)   Vital Signs: Temp: 97.6 F (36.4 C) (02/18 0800) Temp Source: Oral (02/18 0800) BP: 137/71 (02/18 1000) Pulse Rate: 102 (02/18 1100)  Labs: Recent Labs    08/30/18 0857 08/31/18 0855 09/01/18 0532  HGB 12.5*  --  12.9*  HCT 46.5  --  46.4  PLT 159  --  152  CREATININE 0.49* 0.44* 0.48*    Estimated Creatinine Clearance: 222.1 mL/min (A) (by C-G formula based on SCr of 0.48 mg/dL (L)).   Medical History: Past Medical History:  Diagnosis Date  . Anemia    iron deficient  . Cellulitis    lower extremities  . Cellulitis of scrotum    History of  . CHF (congestive heart failure) (HCC)   . History of acute bronchitis with bronchospasm   . Hypertension   . Morbid obesity (HCC)   . Obesity hypoventilation syndrome (HCC)   . Obstructive sleep apnea   . Panniculitis    History of  . Sleep apnea     Medications:  Scheduled:  . amLODipine  10 mg Oral Daily  . artificial tears  1 application Both Eyes Q8H  . aspirin  300 mg Rectal NOW  . chlorhexidine  15 mL Mouth Rinse BID  . cisatracurium  0.1 mg/kg Intravenous Once  . enoxaparin (LOVENOX) injection  100 mg Subcutaneous Q24H  . EPINEPHrine      . EPINEPHrine      . fentaNYL (SUBLIMAZE) injection  100 mcg Intravenous Once  . fentaNYL (SUBLIMAZE) injection  100 mcg Intravenous Once  . fluconazole  200 mg Oral Daily  . lisinopril  5 mg Oral Daily  . mouth rinse  15 mL Mouth Rinse q12n4p  . midazolam  4 mg Intravenous Once  . nystatin cream   Topical BID  . pantoprazole (PROTONIX)  IV  40 mg Intravenous QHS  . potassium chloride  20 mEq Oral BID  . pregabalin  25 mg Oral TID  . sodium chloride flush  3 mL Intravenous Q12H  . sterile water (preservative free)       Infusions:  . sodium chloride Stopped (09/01/18 0757)  . cisatracurium (NIMBEX) infusion    . epinephrine    . fentaNYL infusion INTRAVENOUS    . norepinephrine (LEVOPHED) Adult infusion    . sodium chloride      Assessment: 37 yoM s/p code blue with PEA arrest initially,ventricular fibrillation and at one point this looks like torsades.  Pharmacy is consulted to dose Heparin for suspected ACS.  VTE prophylaxis:  Lovenox 100 mg SQ, last on 2/18 at 09:10  Today, 09/01/2018: SCr 0.48 CBC: Hgb 12.9, Plt 152 No bleeding or complications reported  Goal of Therapy:  Heparin level 0.3-0.7 units/ml Monitor platelets by anticoagulation protocol: Yes   Plan:  D/C Lovenox order. Baseline PTT, PT/INR  AFTER head CT is completed: Give heparin 4000 units bolus IV x 1 Start heparin IV infusion at 1600 units/hr Heparin level 6 hours after starting Daily heparin level and CBC Continue to monitor H&H and platelets  Lynann Beaver PharmD, BCPS Pager [phone]  09/01/2018 1:18 PM

## 2018-09-01 NOTE — Progress Notes (Signed)
Chaplain provided support with family during code event.     Provided pastoral support and assessment, prayers on unit and in conferences with care team.   Family endorses understanding of care team's medical synopsis.  They remain hopeful while understanding that Andre [name] has endured a significant event and could possibly have anoxic injury.     Pt's girlfriend reported that her husband had died two years prior.    Pt's mother reported pt's father, Andre [name], died at [facility]

## 2018-09-01 NOTE — Progress Notes (Signed)
eLink Physician-Brief Progress Note Patient Name: Andre [name] DOB: 1980/10/06 MRN: [id number]   Date of Service  09/01/2018  HPI/Events of Note  Multiple issues: ABG on 100%/PRVC 18/TV 440/P 12 = 7.47/65.7/169.0 and 2. K+ = 3.2 - Patient is in the middle of having K+ replaced.   eICU Interventions  Will order: 1. Continue present ventilator management.  2. Continue to trend K+ after replacement is complete.      Intervention Category Major Interventions: Respiratory failure - evaluation and management;Electrolyte abnormality - evaluation and management  Sommer,Steven Eugene 09/01/2018, 9:48 PM

## 2018-09-01 NOTE — Anesthesia Procedure Notes (Addendum)
Procedure Name: Intubation Date/Time: 09/01/2018 12:30 PM Performed by: Suzette Battiest, MD Laryngoscope Size: Mac, Glidescope and 4 Grade View: Grade II Tube type: Oral Tube size: 7.5 mm Number of attempts: 3 Airway Equipment and Method: Stylet Placement Confirmation: ETT inserted through vocal cords under direct vision,  breath sounds checked- equal and bilateral and CO2 detector Secured at: 23 cm Tube secured with: Tape

## 2018-09-01 NOTE — Progress Notes (Signed)
PROGRESS NOTE    Andre [name]  POE:[id number] DOB: 1980/11/26 DOA: 09-17-18 PCP: Annita Brod, MD  Brief [place] Rast y.o.malewith medical history significant ofmorbid obesity, chronic lymphedema and panniculitis, diastolic congestive heart failure, obstructive sleep apnea and hypo-ventilation syndrome requiring CPAP nightly, hypertension comes in because of shortness of breath. Patient is compliant with his medications. He was started several days ago on clindamycin for presumptive cellulitis. His girlfriend states that his swelling is no worse than normal and she reports no change in the chronic changes and redness to his legs. His scrotum is always swollen. He does not weigh himself at home. He has not had any fevers. Comes in with shortness of breath but they do feel he is more swollen than normal. He has not had any URI symptoms or any respiratory symptoms except shortness of breath. He has chronic lymphedema particularly in his left leg and in his panniculus and scrotum. Patient is being referred for admission for acute on proximal respiratory failure he was initially oxygen sats of 80% on arrival.  Assessment & Plan:   Principal Problem:   Acute on chronic respiratory failure with hypoxia (HCC) Active Problems:   Essential hypertension   Acute on chronic diastolic CHF (congestive heart failure) (HCC)   Obesity hypoventilation syndrome (HCC)   Lymphedema  #1 acute on chronic hypercapnic hypoxic respiratory failure secondary to obstructive sleep apnea/morbid obesity-patient being continued on BiPAP.  His ABG looks very improved with 7.4/87/93/53.  PCCM starting Diamox in view of elevated bicarb above 50.  Lasix on hold.  Patient has lost some weight but is unclear how much she has lost.  Patient has not had a sleep study recently.  He lives at home with his girlfriend.  Even with the BiPAP his CO2 has been high even though it is better than 2 days ago.  Concern is that if  he will need a trach.  #2 acute on chronic diastolic heart failure continue diuresis echo this admission with normal ejection fraction but findings consistent with diastolic dysfunction He is negative over 5 L continues to have significant edema and pannus.  #3 hypertension continue antihypertensives  #4 scrotal edema with fungal rash continue Diflucan and nystatin  #5 urological issue Dr. Annabell Howells unable to place Foley catheter under cystoscopy.  Recommended purewick   DVT prophylaxis:Lovenox  code Status:Full code Family Communication:No family at present Disposition PlanHome when stable  Estimated body mass index is 77.47 kg/m as calculated from the following:   Height as of this encounter: 5\' 6"  (1.676 m).   Weight as of this encounter: 217.7 kg.    Subjective: Patient remains on BiPAP however he is able to say yes and no and answer questions appropriately.  Objective: Vitals:   09/01/18 0406 09/01/18 0425 09/01/18 0800 09/01/18 0810  BP: (!) 126/54  (!) 144/83   Pulse: 79  84   Resp: 17  13   Temp: 98.3 F (36.8 C)  97.6 F (36.4 C)   TempSrc: Axillary  Oral   SpO2: 96%  98% 98%  Weight:  (!) 217.7 kg    Height:        Intake/Output Summary (Last 24 hours) at 09/01/2018 1018 Last data filed at 09/01/2018 0800 Gross per 24 hour  Intake 683.36 ml  Output 1852 ml  Net -1168.64 ml   Filed Weights   09-17-2018 1939 08/29/18 2300 09/01/18 0425  Weight: (!) 226.8 kg (!) 275.3 kg (!) 217.7 kg    Examination:  General exam: Appears  calm and comfortable  Respiratory system: Coarse and diminished breath sounds to auscultation. Respiratory effort normal. Cardiovascular system: S1 & S2 heard, RRR. No JVD, murmurs, rubs, gallops or clicks. No pedal edema. Gastrointestinal system: Abdomen is nondistended, soft and nontender. No organomegaly or masses felt. Normal bowel sounds heard. Central nervous system: Alert and oriented. No focal neurological deficits. Extremities  2+ pitting edema Skin: No rashes, lesions or ulcers Psychiatry: Judgement and insight appear normal. Mood & affect appropriate.     Data Reviewed: I have personally reviewed following labs and imaging studies  CBC: Recent Labs  Lab 03-Sep-2018 2229 08/28/18 0247 08/30/18 0857 09/01/18 0532  WBC 10.6* 9.8 6.9 8.7  NEUTROABS 7.3  --   --   --   HGB 13.1 13.3 12.5* 12.9*  HCT 46.0 48.2 46.5 46.4  MCV 94.5 93.4 99.1 94.5  PLT 219 217 159 152   Basic Metabolic Panel: Recent Labs  Lab 08/28/18 0247 08/29/18 1107 08/30/18 0857 08/31/18 0855 09/01/18 0532  NA 138 138 139 140 138  K 3.6 4.7 4.4 4.1 4.0  CL 94* 91* 88* 82* 81*  CO2 36* 38* 47* >50* >50*  GLUCOSE 112* 111* 97 81 81  BUN 13 14 13 13 16   CREATININE 0.73 0.68 0.49* 0.44* 0.48*  CALCIUM 8.1* 8.1* 8.4* 8.7* 8.9   GFR: Estimated Creatinine Clearance: 222.1 mL/min (A) (by C-G formula based on SCr of 0.48 mg/dL (L)). Liver Function Tests: Recent Labs  Lab 2018/09/03 2229 08/30/18 0857  AST 17 14*  ALT 17 13  ALKPHOS 97 111  BILITOT 1.2 1.0  PROT 7.6 7.7  ALBUMIN 2.7* 2.9*   No results for input(s): LIPASE, AMYLASE in the last 168 hours. No results for input(s): AMMONIA in the last 168 hours. Coagulation Profile: No results for input(s): INR, PROTIME in the last 168 hours. Cardiac Enzymes: No results for input(s): CKTOTAL, CKMB, CKMBINDEX, TROPONINI in the last 168 hours. BNP (last 3 results) No results for input(s): PROBNP in the last 8760 hours. HbA1C: No results for input(s): HGBA1C in the last 72 hours. CBG: No results for input(s): GLUCAP in the last 168 hours. Lipid Profile: No results for input(s): CHOL, HDL, LDLCALC, TRIG, CHOLHDL, LDLDIRECT in the last 72 hours. Thyroid Function Tests: No results for input(s): TSH, T4TOTAL, FREET4, T3FREE, THYROIDAB in the last 72 hours. Anemia Panel: No results for input(s): VITAMINB12, FOLATE, FERRITIN, TIBC, IRON, RETICCTPCT in the last 72 hours. Sepsis  Labs: Recent Labs  Lab 09/03/18 2229  LATICACIDVEN 1.3    Recent Results (from the past 240 hour(s))  Culture, blood (routine x 2)     Status: None (Preliminary result)   Collection Time: September 03, 2018 10:30 PM  Result Value Ref Range Status   Specimen Description   Final    BLOOD RIGHT HAND Performed at [facility], [address] [place], [place] [address]    Special Requests   Final    BOTTLES DRAWN AEROBIC ONLY Blood Culture results may not be optimal due to an inadequate volume of blood received in culture bottles Performed at [facility], [address] [place], [place] [address]    Culture   Final    NO GROWTH 4 DAYS Performed at [facility], [address], [place], [place] [address]    Report Status PENDING  Incomplete  Culture, blood (routine x 2)     Status: None (Preliminary result)   Collection Time: 2018/09/03 10:30 PM  Result Value Ref  Range Status   Specimen Description   Final    BLOOD LEFT HAND Performed at [facility], [address] [place], [place] [address]    Special Requests   Final    BOTTLES DRAWN AEROBIC AND ANAEROBIC Blood Culture adequate volume Performed at [facility], [address] [place], [place] [address]    Culture   Final    NO GROWTH 4 DAYS Performed at [facility], [address] [place], [place] [address]    Report Status PENDING  Incomplete  MRSA PCR Screening     Status: None   Collection Time: 08/28/18  2:10 AM  Result Value Ref Range Status   MRSA by PCR NEGATIVE NEGATIVE Final    Comment:        The GeneXpert MRSA Assay (FDA approved for NASAL specimens only), is one component of a comprehensive MRSA colonization surveillance program. It is not intended to diagnose MRSA infection nor to guide or monitor treatment for MRSA infections. Performed at [facility], [address] [place], [place] [address]           Radiology Studies: Dg Chest 1 View  Result Date: 08/31/2018 CLINICAL DATA:  Hypoxemia. EXAM: CHEST  1 VIEW COMPARISON:  08/30/2018 FINDINGS: Cardiac silhouette is mildly enlarged. There is vascular congestion with opacity central to lower lungs similar to the previous day's exam, greater at the right base. No convincing pneumonia. IMPRESSION: 1. No significant change from the prior day's study. Cardiomegaly with vascular congestion and mild hazy airspace opacities consistent with congestive heart failure. No new abnormalities. Electronically Signed   By: Amie Portlandavid  Ormond M.D.   On: 08/31/2018 15:04   Dg Chest 1 View  Result Date: 08/30/2018 CLINICAL DATA:  Hypoxia. Chronic respiratory failure, H/o CHF, HTN. Smoker. EXAM: CHEST  1 VIEW COMPARISON:  Chest x-rays dated 08/28/18 and 01/23/2018 FINDINGS: Stable cardiomegaly. Overall cardiomediastinal silhouette appears stable in size and configuration. There is increased central pulmonary vascular congestion and bilateral interstitial edema. Probable small RIGHT pleural effusion. No pneumothorax appreciated. Osseous structures about the chest are unremarkable. IMPRESSION: 1. Cardiomegaly with central pulmonary vascular congestion and bilateral interstitial edema, consistent with CHF/volume overload and indicative of worsening fluid status. 2. Probable small RIGHT pleural effusion. Electronically Signed   By: Bary RichardStan  Maynard M.D.   On: 08/30/2018 15:08        Scheduled Meds: . acetaZOLAMIDE  500 mg Intravenous Once  . amLODipine  10 mg Oral Daily  . chlorhexidine  15 mL Mouth Rinse BID  . enoxaparin (LOVENOX) injection  100 mg Subcutaneous Q24H  . fluconazole  200 mg Oral Daily  . lisinopril  5 mg Oral Daily  . mouth rinse  15 mL Mouth Rinse q12n4p  . nystatin cream   Topical BID  . potassium chloride  20 mEq Oral BID  . pregabalin  25 mg Oral TID  . sodium chloride flush  3 mL Intravenous Q12H   Continuous Infusions: . sodium chloride  Stopped (09/01/18 0757)     LOS: 4 days      Alwyn RenElizabeth G Maizie Garno, MD [facility]  If 7PM-7AM, please contact night-coverage [email or web address] Password [email or web address] 09/01/2018, 10:18 AM

## 2018-09-01 NOTE — Procedures (Signed)
Arterial Catheter Insertion Procedure Note ATHARVA KOHEN [id number] [birth year]  Procedure: Insertion of Arterial Catheter  Indications: Blood pressure monitoring and Frequent blood sampling  Procedure Details Consent: Unable to obtain because of, patient intubated on ventilator Time Out: Verified patient identification, verified procedure, site/side was marked, verified correct patient position, special equipment/implants available, medications/allergies/relevent history reviewed, required imaging and test results available.  Performed  Maximum sterile technique was used including cap, gloves, gown, hand hygiene, mask and sheet. Skin prep: Chlorhexidine; local anesthetic administered 20 gauge catheter was inserted into right radial artery using the Seldinger technique. ULTRASOUND GUIDANCE USED: NO Evaluation Blood flow good; BP tracing good. Complications: No apparent complications   Assisted by Merlene Laughter, RRT.   Carlynn Spry 09/01/2018

## 2018-09-01 NOTE — Progress Notes (Signed)
eLink Physician-Brief Progress Note Patient Name: Andre [name] DOB: 1980/10/01 MRN: [id number]   Date of Service  09/01/2018  HPI/Events of Note  Multiple issues: 1. K+ = 3.4 and Creatinine = 1.08 and 2. ABG on 100%/PRVC 25/TV 440/P 12 = 7.57/47.3/57.6. Sat now 100% on oximetry.   eICU Interventions  Will order: 1. Replace K+. 2. Decrease PRVC rate to 18. 3. ABG at 8:45 PM.     Intervention Category Major Interventions: Electrolyte abnormality - evaluation and management;Acid-Base disturbance - evaluation and management;Respiratory failure - evaluation and management  Lenell Antu 09/01/2018, 7:36 PM

## 2018-09-01 NOTE — Progress Notes (Signed)
Physical Therapy Discharge Patient Details Name: Andre [name] MRN: [id number] DOB: 1980-08-27 Today's Date: 09/01/2018 Time:  -     Patient discharged from PT services secondary to medical decline - will need to re-order PT to resume therapy services.  Please see latest therapy progress note for current level of functioning and progress toward goals.    09/01/18: PEA arrest and ventilated.  Please re-order when pt medically stable.  Thanks.     Aseel Truxillo,KATHrine E 09/01/2018, 2:16 PM  Zenovia Jarred, PT, DPT Acute Rehabilitation Services Office: [phone] Pager: [phone]

## 2018-09-01 NOTE — Progress Notes (Addendum)
NAME:  Andre [name], MRN:  [id number], DOB:  1981-03-04, LOS: 4 ADMISSION DATE:  September 09, 2018, CONSULTATION DATE: 08/31/2018 REFERRING MD: Ashley Royalty, CHIEF COMPLAINT: Hypoxemic respiratory failure  Brief History   Patient was admitted with shortness of breath Currently being treated for hypercapnic respiratory failure, obesity hypoventilation History of respiratory failure, obstructive sleep apnea for which he is on CPAP at home Recent treatment for cellulitis  Past Medical History  Iron deficient anemia, chronic lymphedema, prior cellulitis, heart failure, hypertension, obesity hypoventilation syndrome, obstructive sleep apnea.  Significant Hospital Events   Admitted 2/13 With shortness of breath.  Treated with escalated diuretic dosing and noninvasive positive pressure ventilation PCCM consulted on 2/17 due to persistent hypercarbia 2/18 still hypercarbic but bicarb > 50. Adding diamox and holding lasix 2/18: PEA arrest, did have VT/VF episodes during this event.  Etiology unclear initially, presumptively hypoxia related.  Was difficult intubation by anesthesia.  Time to return of spontaneous circulation recorded at 20 minutes.  Received several rounds of epinephrine, 4-5 defibrillation, 2 rounds of bicarbonate, and magnesium sulfate.  Post intubation chest x-ray worrisome for peritracheal hematoma.  CT imaging ordered to rule out cerebral bleed, as well as further CT chest imaging to rule out pulmonary emboli as well as paratracheal hematoma. Consults:  PCCM 08/31/2018  Procedures:  OETT 2/ 18 (DIFFICULT AIRWAY per anesthesia)>>> Right IJ CVL 2/18>>>   Significant Diagnostic Tests:  ABG-PCO2 in the 100s CT head 2/18>>> CT chest 2/18.>>> Micro Data:  Blood cultures on 09-Sep-2018-negative to date MRSA by PCR negative  Antimicrobials:  Diflucan p.o.  Interim history/subjective:  Now intubated.   Objective   Blood pressure 137/71, pulse (Abnormal) 102, temperature 97.6 F  (36.4 C), temperature source Oral, resp. rate (Abnormal) 22, height 5\' 6"  (1.676 m), weight (Abnormal) 217.7 kg, SpO2 (Abnormal) 84 %.    Vent Mode: PRVC FiO2 (%):  [40 %-100 %] 100 % Set Rate:  [30 bmp] 30 bmp Vt Set:  [510 mL] 510 mL PEEP:  [5 cmH20] 5 cmH20 Plateau Pressure:  [29 cmH20] 29 cmH20   Intake/Output Summary (Last 24 hours) at 09/01/2018 1411 Last data filed at 09/01/2018 0800 Gross per 24 hour  Intake 193.49 ml  Output 1852 ml  Net -1658.51 ml   Filed Weights   September 09, 2018 1939 08/29/18 2300 09/01/18 0425  Weight: (Abnormal) 226.8 kg (Abnormal) 275.3 kg (Abnormal) 217.7 kg   Examination:  General: Now unresponsive, orally intubated, right IJ triple-lumen in satisfactory position HEENT: Neck is large, orally intubated, mucous membranes moist.  Has a size 7.5 endotracheal tube Pulmonary: Diffuse rhonchi with accessory use Cardiac: Regular rate and rhythm Abdomen: Obese, large pannus, hypoactive Extremities: Cool, dry, chronic edema Neuro: [facility] Problem list     Assessment & Plan:    Acute on chronic hypercarbic respiratory failure initially 2/2 decompensated OSA and decompensated OHS but now c/b cardiopulmonary arrest. -Difficult airway -Acutely hypoxic and cyanotic.  Etiology unclear.  Question cardiac event versus DVT although had been on Lovenox also possible aspiration.  Just prior to event was fully awake and oriented so progressive hypercarbia seems unlikely Plan Full ventilatory support Hypothermia protocol VAP bundle IV Unasyn empirically for aspiration Follow-up chest x-ray May need tracheostomy if survives  Widening peritracheal/ right mediastinal mass -Possible hematoma Plan Repeat chest x-ray, I worry about body habitus and atelectasis Stat CBC, this is been ordered We will see if we can get CT of chest with contrast to rule out hematoma  Cardiac arrest, initially PEA,  did have episodes of VT/VF Cardiogenic  shock Diastolic heart failure -Presumptively this was hypoxia.  However acute coronary event or thromboembolic event seem plausible.  There is also question of aspiration event.  Hypercarbia seems unlikely as he was fully awake and had been tolerating PCO2 in the 90s previously Plan Stat CT chest Cycle cardiac enzymes Echocardiogram Weaning norepinephrine for mean arterial pressure greater than 80 Cardiology consultation We will hold off on further amiodarone for now Hold lisinopril and Norvasc  Acute encephalopathy.  Concern for hypoxic encephalopathy status post prolonged time to review of spontaneous circulation Plan Stat CT brain If negative initiate hypothermia protocol, have already initiated cool saline and cooling blanket Will need transfer to [facility], aiming for 33 degrees goal  Contraction alkalosis Plan Will need close observation of arterial blood gases He will be at high risk for over ventilating  At risk for AKI s/p cardiac arrest Plan Renal dose meds Serial chemistries   Urinary incontinence -Urology was unable to place Foley catheter due to body habitus Plan Strict intake and output  chronic Lymphedema  Plan Continue diuresis and   Best practice:  Diet: N.p.o. at present Pain/Anxiety/Delirium protocol (if indicated): Motrin as needed VAP protocol (if indicated): Not indicated DVT prophylaxis: LOVENOX  GI prophylaxis: Not indicated Glucose control:  Mobility: Bedrest Code Status: Full code Family Communication: No family at bedside Disposition he is critically ill, now status post cardiac arrest.  Is in cardiogenic shock currently.  Etiology is unclear initially.  Question acute coronary event versus hypoxic event, would favor hypoxic event.  Although he had been looking fairly well prior to this.  Were getting CT imaging to rule out cerebral bleed, I have asked radiology to attempt to get CT of chest as well, both to rule out pulmonary emboli but  also given chest x-ray concerns of possible paratracheal hematoma.  If no bleeding, and hemoglobin stable he will need to be transferred to [facility] for hypothermia, we have initiated cooling peripherally with ice packs and cool saline already.  I have updated his mother and significant other, they understand his prognosis is quite poor given 20-minute time to return of spontaneous circulation.  He does remain full code at this point.  My critical care time is 48 minutes  Simonne Martinet ACNP-BC [facility] Pulmonary/Critical Care Pager # [phone] OR # [phone] if no answer

## 2018-09-01 NOTE — Code Documentation (Signed)
PATIENT NAME: HANZ JECK MEDICAL RECORD NUMBER: [id number] Birthday: December 18, 1980  Age: 38 y.o. Admit Date: 09-21-18  Provider: Tanja Port  Indication: PEA arrest   Technical Description:   CPR performance duration: 20 minutes.   Was defibrillation or cardioversion used X4  Was external pacer placed ? No   Was patient intubated pre/post CPR Intubated during ACLS  Was transvenous pacer placed ? no  Medications Administered Include      Yes/no Amiodarone  yes  Atropin no  Calcium no  Epinephrine X5  Lidocaine no  Magnesium yes  Norepinephrine yes  Phenylephrine no  Sodium bicarbonate X2  Vasopression no   Evaluation  Final Status - Was patient successfully resuscitated ? no  If successfully resuscitated - what is current rhythm ? ST If successfully resuscitated - what is current hemodynamic status ? Currently on norepinephrine infusion  Miscellaneous Information Patient had been doing well compared to prior day.  Was easily arousable.  PCO2 had improved marginally.  We are making transition to Diamox to try to address contraction alkalosis.  He had been removed from BiPAP, was actually talking on phone and doing quite well.  Developed sudden onset of unresponsiveness with PEA arrest.  Initial rhythm with PEA, he did have several conversions to ventricular fibrillation necessitating amiodarone bolus as well as several defibrillation attempts.  He was a very difficult intubation requiring several attempts by CRNA staff which were unsuccessful, ultimately airway obtained by anesthesia services.  Manual bag mask ventilation was performed per protocol during all ACLS.  Return of spontaneous circulation was achieved after 20 minutes of CPR.  Etiology of arrest remains somewhat unclear.  Plan - stat CT brain -Titrate Norepi for MAP > 80 - if CT neg for Bleed start heparin gtt - transfer to [facility] for hypothermia protocol   Simonne Martinet ACNP-BC [facility] Pulmonary/Critical  Care Pager # [phone] OR # [phone] if no answer   Shelby Mattocks 2/18/20202:00 PM

## 2018-09-01 NOTE — Progress Notes (Signed)
Pharmacy Antibiotic Note  Andre [name] is a 38 y.o. male admitted on 09/07/2018 with SOB.  Pharmacy has been consulted for unasyn dosing for aspiration PNA.  Plan: Unaysn 3 gm IV q6h  Height: 5\' 6"  (167.6 cm) Weight: (!) 480 lb (217.7 kg)(weighed on bariatric bed. charge nurse verified weight.) IBW/kg (Calculated) : 63.8  Temp (24hrs), Avg:97.8 F (36.6 C), Min:97.5 F (36.4 C), Max:98.3 F (36.8 C)  Recent Labs  Lab 08/23/2018 2229 08/28/18 0247 08/29/18 1107 08/30/18 0857 08/31/18 0855 09/01/18 0532 09/01/18 1304  WBC 10.6* 9.8  --  6.9  --  8.7  --   CREATININE 0.76 0.73 0.68 0.49* 0.44* 0.48* 0.81  LATICACIDVEN 1.3  --   --   --   --   --   --     Estimated Creatinine Clearance: 219.3 mL/min (by C-G formula based on SCr of 0.81 mg/dL).    Allergies  Allergen Reactions  . Other Hives    Steroid, but cannot remember name.    Antimicrobials this admission: 2/13CTX x 1 2/13 Vanc x 1 2/14 Clinda>>2/16 2/15 fluconazole >>2/16 2/18 unasyn>> Dose adjustments this admission:  Microbiology results: 2/14 MRSA PCR - 2/13 BCx2>>NGF  Thank you for allowing pharmacy to be a part of this patient's care. Herby Abraham, Pharm.D [phone] 09/01/2018 3:07 PM

## 2018-09-01 NOTE — Progress Notes (Signed)
ANTICOAGULATION CONSULT NOTE - Initial Consult  Pharmacy Consult for LMWH Indication: VTE Px  Allergies  Allergen Reactions  . Other Hives    Steroid, but cannot remember name.    Patient Measurements: Height: 5\' 6"  (167.6 cm) Weight: (!) 480 lb (217.7 kg)(weighed on bariatric bed. charge nurse verified weight.) IBW/kg (Calculated) : 63.8 Heparin Dosing Weight: 121 kg  (based on most recent weight 217.7 kg, 09/01/18)   Vital Signs: Temp: 97.6 F (36.4 C) (02/18 0800) Temp Source: Oral (02/18 0800) BP: 167/95 (02/18 1430) Pulse Rate: 93 (02/18 1430)  Labs: Recent Labs    08/30/18 0857 08/31/18 0855 09/01/18 0532 09/01/18 1304  HGB 12.5*  --  12.9*  --   HCT 46.5  --  46.4  --   PLT 159  --  152  --   APTT  --   --   --  30  LABPROT  --   --   --  15.4*  INR  --   --   --  1.24  CREATININE 0.49* 0.44* 0.48* 0.81  TROPONINI  --   --   --  0.04*    Estimated Creatinine Clearance: 219.3 mL/min (by C-G formula based on SCr of 0.81 mg/dL).   Medical History: Past Medical History:  Diagnosis Date  . Anemia    iron deficient  . Cellulitis    lower extremities  . Cellulitis of scrotum    History of  . CHF (congestive heart failure) (HCC)   . History of acute bronchitis with bronchospasm   . Hypertension   . Morbid obesity (HCC)   . Obesity hypoventilation syndrome (HCC)   . Obstructive sleep apnea   . Panniculitis    History of  . Sleep apnea     Medications:  Scheduled:  . amLODipine  10 mg Oral Daily  . artificial tears  1 application Both Eyes Q8H  . chlorhexidine  15 mL Mouth Rinse BID  . EPINEPHrine      . EPINEPHrine      . fentaNYL (SUBLIMAZE) injection  100 mcg Intravenous Once  . fluconazole  200 mg Oral Daily  . iopamidol      . lisinopril  5 mg Oral Daily  . mouth rinse  15 mL Mouth Rinse q12n4p  . nystatin cream   Topical BID  . pantoprazole (PROTONIX) IV  40 mg Intravenous QHS  . potassium chloride  20 mEq Oral BID  . pregabalin  25 mg  Oral TID  . sodium chloride (PF)      . sodium chloride flush  3 mL Intravenous Q12H  . sterile water (preservative free)       Infusions:  . sodium chloride Stopped (09/01/18 0757)  . ampicillin-sulbactam (UNASYN) IV    . cisatracurium (NIMBEX) infusion 1 mcg/kg/min (09/01/18 1514)  . fentaNYL infusion INTRAVENOUS 300 mcg/hr (09/01/18 1400)  . midazolam (VERSED) 50mg  in NS 29mL (1mg /ml) premix infusion 6 mg/hr (09/01/18 1400)  . norepinephrine (LEVOPHED) Adult infusion    . sodium chloride      Assessment: 37 yoM s/p code blue with PEA arrest initially,ventricular fibrillation and at one point this looks like torsades.  Pharmacy is consulted to dose Heparin for suspected ACS.  VTE prophylaxis:  Lovenox 100 mg SQ, last on 2/18 at 09:10  Today, 09/01/2018: SCr 0.48 CBC: Hgb 12.9, Plt 152 No bleeding or complications reported S/p code blue event w/ 20 min CPR CT neg for PE, neg for mediastinal hematoma, head CT neg  for bleed   Plan:  Resume LMWH 100 mg sq q24 for VTE px- next dose 2/19 AM  Herby Abraham, Pharm.D [phone] 09/01/2018 3:34 PM

## 2018-09-01 NOTE — Progress Notes (Signed)
Notified by Pharmacist Wynona Canes and Kathrine Cords RN needed assistance in 1230. Upon entering room, patient appeared to be cyanotic, no carotid pulse felt upon palpation. Began CPR and called Code. Medical Alert Activated.

## 2018-09-01 NOTE — Progress Notes (Signed)
Rt took pt off BIPAP and placed 2 LPM [place]. No distress noted at this time.

## 2018-09-01 NOTE — Progress Notes (Signed)
   NAME:  Andre [name], MRN:  [id number], DOB:  1981/02/21, LOS: 4 ADMISSION DATE:  09/21/2018, CONSULTATION DATE: 08/31/2018 REFERRING MD: Ashley Royalty, CHIEF COMPLAINT: Hypoxemic respiratory failure  Brief History   Patient was admitted with shortness of breath Currently being treated for hypercapnic respiratory failure, obesity hypoventilation History of respiratory failure, obstructive sleep apnea for which he is on CPAP at home Recent treatment for cellulitis  Past Medical History  Iron deficient anemia, chronic lymphedema, prior cellulitis, heart failure, hypertension, obesity hypoventilation syndrome, obstructive sleep apnea.  Significant Hospital Events   Admitted 2/13 With shortness of breath.  Treated with escalated diuretic dosing and noninvasive positive pressure ventilation PCCM consulted on 2/17 due to persistent hypercarbia 2/18 still hypercarbic but bicarb > 50. Adding diamox and holding lasix  Consults:  PCCM 08/31/2018  Procedures:    Significant Diagnostic Tests:  ABG-PCO2 in the 100s  Micro Data:  Blood cultures on September 21, 2018-negative to date MRSA by PCR negative  Antimicrobials:  Diflucan p.o.  Interim history/subjective:    Objective   Blood pressure (Abnormal) 144/83, pulse 84, temperature 98.3 F (36.8 C), temperature source Axillary, resp. rate 13, height 5\' 6"  (1.676 m), weight (Abnormal) 217.7 kg, SpO2 98 %.    FiO2 (%):  [30 %-100 %] 40 %   Intake/Output Summary (Last 24 hours) at 09/01/2018 0935 Last data filed at 09/01/2018 0800 Gross per 24 hour  Intake 683.36 ml  Output 1852 ml  Net -1168.64 ml   Filed Weights   21-Sep-2018 1939 08/29/18 2300 09/01/18 0425  Weight: (Abnormal) 226.8 kg (Abnormal) 275.3 kg (Abnormal) 217.7 kg   Examination: General: This is a massively obese 38 year old male patient currently lying in bed. HEENT BiPAP in place.  Still has significant air leak Pulmonary: Diminished throughout Cardiac: Regular rate and  rhythm Abdomen: Obese.  Positive bowel sounds. Extremities: Warm, dry and cracked.  Chronic edema Neuro: Awake, lethargic.  Moves all extremities. GU: Clear yellow  Resolved Hospital Problem list     Assessment & Plan:  Acute on chronic diastolic heart failure -Echo did reveal normal systolic function, no dilatation of the right side of the heart -Could not assess right pulmonary pressures Plan Continuing diuresis BP control: Continue lisinopril, and Norvasc.  Acute on chronic hypercarbic respiratory failure most likely 2/2 decompensated OSA and decompensated  -Significant elevation of his CO2  CPAP at home > Query compliance vs CPAP failure -CO2 remains quite high, some of this may also be negatively impacted by contraction alkalosis Plan Continuing BiPAP Supplemental oxygen which is oximetry Continue to push diuresis as BUN and creatinine tolerate, will try Diamox today given bicarbonate greater than 50 He needs to get out of bed I worry he will eventually need tracheostomy Sedating drugs  chronic Lymphedema  Plan Continue diuresis and   Best practice:  Diet: N.p.o. at present Pain/Anxiety/Delirium protocol (if indicated): Motrin as needed VAP protocol (if indicated): Not indicated DVT prophylaxis: LOVENOX  GI prophylaxis: Not indicated Glucose control:  Mobility: Bedrest Code Status: Full code Family Communication: No family at bedside Disposition keep him in the intensive care.  He remains at high risk for decompensation.  We will hold Lasix today and give a dose of Diamox.  Will need repeat ABG in a.m.  He also needs to mobilize.  Simonne Martinet ACNP-BC [facility] Pulmonary/Critical Care Pager # [phone] OR # [phone] if no answer

## 2018-09-01 NOTE — ED Provider Notes (Signed)
I was called up to the ICU for a CODE BLUE.  Patient is currently pulseless and CPR is ongoing.  Patient is currently being intubated by anesthesia though with a lot of difficulty.  The patient is found to have ventricular fibrillation and at one point this looks like torsades.  I directed CPR, including defibrillations and multiple medications including epinephrine, magnesium, and amiodarone.  ICU team at the bedside currently and anesthesia has intubated patient, and thus I have left the rest of the code to them.  Cardiopulmonary Resuscitation (CPR) Procedure Note Directed/Performed by: Audree Camel I personally directed ancillary staff and/or performed CPR in an effort to regain return of spontaneous circulation and to maintain cardiac, neuro and systemic perfusion.     Pricilla Loveless, MD 09/01/18 1254

## 2018-09-01 NOTE — Consult Note (Signed)
Cardiology Consultation:   [name] ID: Andre [name] MRN: [id number]; DOB: 15-Nov-1980  Admit date: 08/26/2018 Date of Consult: 09/01/2018  Primary Care Provider: Annita Brod, MD Primary Cardiologist: No primary care provider on file. [place] Primary Electrophysiologist:  None    [name] Profile:   Andre [name] is a 38 y.o. male with a hx of obesity hypoventilation syndrome with chronic hypercapnic respiratory failure who is being seen today for Andre evaluation of PEA cardiac arrest at Andre request of critical care medicine Zenia Resides, NP.  History of Present Illness:   Andre [name] is a young gentleman with morbid obesity, chronic lower extremity swelling, left leg greater than right, history of cellulitis, obesity hypoventilation syndrome, obstructive sleep apnea, and history of chronic diastolic heart failure.  Has been in Andre hospital for several days with acute on chronic respiratory failure and hypercarbia.  Had been treated with diuresis and BiPAP with improving clinical status.  BiPAP was removed earlier today and a O2 cannula placed.  Within 90 minutes Andre [name] became cyanotic and suffered a PEA arrest.  He was a difficult intubation, and also required 20 minutes of CPR.  Return of spontaneous circulation occurred after an episode of ventricular fibrillation x2 that was converted.  An EKG performed after return of spontaneous circulation was reviewed and does not reveal evidence of acute injury or significant bradycardia.  There were no complaints of chest discomfort prior to Andre sudden collapse.  He has now been transferred to [facility], is in Andre intensive care unit, and is being treated with hypothermia.  Past Medical History:  Diagnosis Date  . Anemia    iron deficient  . Cellulitis    lower extremities  . Cellulitis of scrotum    History of  . CHF (congestive heart failure) (HCC)   . History of acute bronchitis with bronchospasm   .  Hypertension   . Morbid obesity (HCC)   . Obesity hypoventilation syndrome (HCC)   . Obstructive sleep apnea   . Panniculitis    History of  . Sleep apnea     History reviewed. No pertinent surgical history.   Home Medications:  Prior to Admission medications   Medication Sig Start Date End Date Taking? Authorizing Provider  clindamycin (CLEOCIN) 150 MG capsule Take 300 mg by mouth 4 (four) times daily. Take 300mg  by mouth four times daily 08/19/18  Yes [provider]  furosemide (LASIX) 40 MG tablet Take 1 tablet (40 mg total) by mouth daily. 06/23/18  Yes Antony Madura, PA-C  ibuprofen (ADVIL,MOTRIN) 200 MG tablet Take 600 mg by mouth every 6 (six) hours as needed for moderate pain.   Yes [provider]  lisinopril (PRINIVIL,ZESTRIL) 5 MG tablet Take 1 tablet (5 mg total) by mouth daily. 06/23/18  Yes Antony Madura, PA-C  albuterol (PROVENTIL HFA;VENTOLIN HFA) 108 (90 Base) MCG/ACT inhaler Inhale 2 puffs into Andre lungs every 6 (six) hours as needed for wheezing or shortness of breath. [name] not taking: Reported on 06/13/2018 01/25/18   Briant Cedar, MD  amLODipine (NORVASC) 10 MG tablet Take 1 tablet (10 mg total) by mouth daily. [name] not taking: Reported on 06/22/2018 01/25/18 02/24/18  Briant Cedar, MD  diphenhydrAMINE-zinc acetate (BENADRYL) cream Apply topically 3 (three) times daily as needed for itching. 08/30/18   Alwyn Ren, MD  fluconazole (DIFLUCAN) 100 MG tablet Take 1 tablet (100 mg total) by mouth daily for 5 days. 08/30/18 09/04/18  Alwyn Ren, MD  furosemide (  LASIX) 40 MG tablet Take 2 tablets (80 mg total) by mouth 2 (two) times daily for 30 days. 08/30/18 09/29/18  Alwyn Ren, MD  nystatin cream (MYCOSTATIN) Apply topically 2 (two) times daily. 08/30/18   Alwyn Ren, MD  potassium chloride SA (K-DUR,KLOR-CON) 20 MEQ tablet Take 1 tablet (20 mEq total) by mouth 2 (two) times daily. 08/30/18   Alwyn Ren, MD  pregabalin (LYRICA) 25 MG capsule Take 25 mg by mouth 3 (three) times daily. 08/05/18   [provider]    Inpatient Medications: Scheduled Meds: . amLODipine  10 mg Oral Daily  . artificial tears  1 application Both Eyes Q8H  . chlorhexidine  15 mL Mouth Rinse BID  . [START ON 09/02/2018] enoxaparin (LOVENOX) injection  100 mg Subcutaneous Q24H  . EPINEPHrine      . EPINEPHrine      . fentaNYL (SUBLIMAZE) injection  100 mcg Intravenous Once  . fluconazole  200 mg Oral Daily  . iopamidol      . lisinopril  5 mg Oral Daily  . mouth rinse  15 mL Mouth Rinse q12n4p  . nystatin cream   Topical BID  . pantoprazole (PROTONIX) IV  40 mg Intravenous QHS  . potassium chloride  20 mEq Oral BID  . pregabalin  25 mg Oral TID  . sodium chloride (PF)      . sodium chloride flush  3 mL Intravenous Q12H  . sterile water (preservative free)       Continuous Infusions: . sodium chloride Stopped (09/01/18 0757)  . ampicillin-sulbactam (UNASYN) IV    . cisatracurium (NIMBEX) infusion 2 mcg/kg/min (09/01/18 1737)  . fentaNYL infusion INTRAVENOUS 400 mcg/hr (09/01/18 1500)  . midazolam (VERSED) 50mg  in NS 50mL (1mg /ml) premix infusion 8 mg/hr (09/01/18 1500)  . norepinephrine (LEVOPHED) Adult infusion 6 mcg/min (09/01/18 1530)  . sodium chloride     PRN Meds: sodium chloride, albuterol, [COMPLETED] cisatracurium **AND** cisatracurium (NIMBEX) infusion **AND** cisatracurium, fentaNYL, ibuprofen, midazolam, sodium chloride flush  Allergies:    Allergies  Allergen Reactions  . Other Hives    Steroid, but cannot remember name.    Social History:   Social History   Socioeconomic History  . Marital status: Single    Spouse name: Not on file  . Number of children: Not on file  . Years of education: Not on file  . Highest education level: Not on file  Occupational History  . Not on file  Social Needs  . Financial resource strain: Not on file  . Food insecurity:     Worry: Not on file    Inability: Not on file  . Transportation needs:    Medical: Not on file    Non-medical: Not on file  Tobacco Use  . Smoking status: Current Every Day Smoker    Packs/day: 1.00    Years: 9.00    Pack years: 9.00  . Smokeless tobacco: Never Used  Substance and Sexual Activity  . Alcohol use: Yes  . Drug use: No  . Sexual activity: Never  Lifestyle  . Physical activity:    Days per week: Not on file    Minutes per session: Not on file  . Stress: Not on file  Relationships  . Social connections:    Talks on phone: Not on file    Gets together: Not on file    Attends religious service: Not on file    Active member of club or organization: Not on file  Attends meetings of clubs or organizations: Not on file    Relationship status: Not on file  . Intimate partner violence:    Fear of current or ex partner: Not on file    Emotionally abused: Not on file    Physically abused: Not on file    Forced sexual activity: Not on file  Other Topics Concern  . Not on file  Social History Narrative  . Not on file    Family History:    Family History  Problem Relation Age of Onset  . Asthma Mother   . Cancer Father   . Hypertension Other      ROS:  Please see Andre history of present illness.  Unable to obtain review of systems from Andre [name] as he is intubated and sedated. All other ROS reviewed and negative.     Physical Exam/Data:   Vitals:   09/01/18 1535 09/01/18 1540 09/01/18 1545 09/01/18 1550  BP: (!) 175/92 (!) 178/87 (!) 167/76 (!) 172/114  Pulse: 89 90 88 82  Resp: (!) 30 (!) 22 (!) 29 17  Temp:      TempSrc:      SpO2: 91% 93% 90% 98%  Weight:      Height:        Intake/Output Summary (Last 24 hours) at 09/01/2018 1753 Last data filed at 09/01/2018 1530 Gross per 24 hour  Intake 343.22 ml  Output 2 ml  Net 341.22 ml   Last 3 Weights 09/01/2018 08/29/2018 08/29/2018  Weight (lbs) 480 lb 607 lb (No Data)  Weight (kg) 217.727 kg  275.333 kg (No Data)     Body mass index is 77.47 kg/m.  General: Morbidly obese, bilateral lower extremity swelling left greater than right with chronic dermatitis change on left lower extremity and shin. HEENT: Pupils are normal size.  Light reflex was not checked.  Pupils are equal. Lymph: no obvious adenopathy Neck: Unable to assess neck veins due to neck morphology and support apparatus. Endocrine: Unable to palpate Andre thyroid. Vascular: Symmetrical carotid and radial pulses. Cardiac: Difficult due to support apparatus and hypothermia pads.  No obvious murmur. Lungs: Distant breath sounds are heard. Abd: Morbidly obese without significant tenderness.  Hypoactive bowel sounds. Ext: Severe, left greater than right lower extremity edema Musculoskeletal: No obvious deformities. Skin: No evidence of cyanosis. Neuro: Andre [name] is not conscious/sedated. Psych: Unable to assess.  EKG:  Andre EKG was personally reviewed and demonstrates: At 12:44 PM on 218/2020 Andre [name]'s tracing demonstrated normal sinus rhythm, right axis deviation, poor R wave progression. Telemetry:  Telemetry was personally reviewed and demonstrates: Sinus rhythm.  Relevant CV Studies: 2D Doppler echocardiogram done 08/29/2018: IMPRESSIONS    1. Andre left ventricle has hyperdynamic systolic function, with an ejection fraction of >65%. Andre cavity size was mildly dilated. There is mildly increased left ventricular wall thickness. Left ventricular diastolic Doppler parameters are consistent with  pseudonormalization Elevated left ventricular end-diastolic pressure Andre E/e' is 46.5.  2. Andre right ventricle has normal systolic function. Andre cavity was normal. There is no increase in right ventricular wall thickness. Right ventricular systolic pressure could not be assessed.  3. Left atrial size was severely dilated.  4. Andre mitral valve is normal in structure.  5. Andre tricuspid valve is normal in structure.  6. Andre  aortic valve is tricuspid Mild sclerosis of Andre aortic valve.  7. Andre pulmonic valve was normal in structure.  8. Andre inferior vena cava was dilated in size with >50%  respiratory variability.  9. Right atrial pressure is estimated at 8 mmHg. 10. Andre interatrial septum appears to be lipomatous.   Laboratory Data:  Chemistry Recent Labs  Lab 08/31/18 0855 09/01/18 0532 09/01/18 1304  NA 140 138 140  K 4.1 4.0 3.6  CL 82* 81* 83*  CO2 >50* >50* 39*  GLUCOSE 81 81 108*  BUN 13 16 16   CREATININE 0.44* 0.48* 0.81  CALCIUM 8.7* 8.9 9.0  GFRNONAA >60 >60 >60  GFRAA >60 >60 >60  ANIONGAP NOT CALCULATED NOT CALCULATED 18*    Recent Labs  Lab 09/01/2018 2229 08/30/18 0857  PROT 7.6 7.7  ALBUMIN 2.7* 2.9*  AST 17 14*  ALT 17 13  ALKPHOS 97 111  BILITOT 1.2 1.0   Hematology Recent Labs  Lab 08/28/18 0247 08/30/18 0857 09/01/18 0532  WBC 9.8 6.9 8.7  RBC 5.16 4.69 4.91  HGB 13.3 12.5* 12.9*  HCT 48.2 46.5 46.4  MCV 93.4 99.1 94.5  MCH 25.8* 26.7 26.3  MCHC 27.6* 26.9* 27.8*  RDW 21.3* 20.8* 20.0*  PLT 217 159 152   Cardiac Enzymes Recent Labs  Lab 09/01/18 1304  TROPONINI 0.04*   No results for input(s): TROPIPOC in Andre last 168 hours.  BNP Recent Labs  Lab 08/22/2018 2229  BNP 235.6*    DDimer No results for input(s): DDIMER in Andre last 168 hours.  Radiology/Studies:  Dg Chest 1 View  Result Date: 09/01/2018 CLINICAL DATA:  Difficult intubation. EXAM: CHEST  1 VIEW COMPARISON:  Radiographs of August 31, 2018. FINDINGS: Stable cardiomegaly with central pulmonary vascular congestion and possible pulmonary edema. Nasogastric tube is seen entering Andre stomach. Andre endotracheal tube is seen projected over tracheal air shadow, but it is difficult to evaluate its distal tip due to body habitus. It appears to be at least several cm above Andre carina. Right internal jugular catheter is noted with tip in expected position of right innominate vein. There is widening of  Andre right paratracheal region which may represent mass or possibly hematoma. No pneumothorax or effusion is noted. Visualized bony thorax is unremarkable. IMPRESSION: Widening of Andre right paratracheal region is noted concerning for possible mediastinal mass or hematoma. CT scan of Andre chest with intravenous contrast is recommended for further evaluation. Nasogastric tube appears to be in grossly good position. Endotracheal tube is projected over tracheal air shadow, but distal tip is not well visualized due to body habitus, but it appears to be at least several cm above Andre carina. Repeat radiograph is recommended. These results will be called to Andre ordering clinician or representative by Andre Radiologist Assistant, and communication documented in Andre PACS or zVision Dashboard. Stable cardiomegaly is noted with central pulmonary vascular congestion and probable bilateral pulmonary edema. Electronically Signed   By: Lupita Raider, M.D.   On: 09/01/2018 13:56   Dg Chest 1 View  Result Date: 08/31/2018 CLINICAL DATA:  Hypoxemia. EXAM: CHEST  1 VIEW COMPARISON:  08/30/2018 FINDINGS: Cardiac silhouette is mildly enlarged. There is vascular congestion with opacity central to lower lungs similar to Andre previous day's exam, greater at Andre right base. No convincing pneumonia. IMPRESSION: 1. No significant change from Andre prior day's study. Cardiomegaly with vascular congestion and mild hazy airspace opacities consistent with congestive heart failure. No new abnormalities. Electronically Signed   By: Amie Portland M.D.   On: 08/31/2018 15:04   Dg Chest 1 View  Result Date: 08/30/2018 CLINICAL DATA:  Hypoxia. Chronic respiratory failure, H/o CHF, HTN. Smoker. EXAM:  CHEST  1 VIEW COMPARISON:  Chest x-rays dated 2018-09-02 and 01/23/2018 FINDINGS: Stable cardiomegaly. Overall cardiomediastinal silhouette appears stable in size and configuration. There is increased central pulmonary vascular congestion and bilateral  interstitial edema. Probable small RIGHT pleural effusion. No pneumothorax appreciated. Osseous structures about Andre chest are unremarkable. IMPRESSION: 1. Cardiomegaly with central pulmonary vascular congestion and bilateral interstitial edema, consistent with CHF/volume overload and indicative of worsening fluid status. 2. Probable small RIGHT pleural effusion. Electronically Signed   By: Bary RichardStan  Maynard M.D.   On: 08/30/2018 15:08   Ct Head Wo Contrast  Result Date: 09/01/2018 CLINICAL DATA:  38 y/o M; episode of pulseless arrest post 20 minutes CPR and defibrillation. Persistent unresponsiveness. EXAM: CT HEAD WITHOUT CONTRAST TECHNIQUE: Contiguous axial images were obtained from Andre base of Andre skull through Andre vertex without intravenous contrast. COMPARISON:  None. FINDINGS: Brain: No evidence of acute infarction, hemorrhage, hydrocephalus, extra-axial collection or mass lesion/mass effect. Vascular: No hyperdense vessel or unexpected calcification. Skull: Normal. Negative for fracture or focal lesion. Sinuses/Orbits: Ethmoid air cell mucosal thickening. Additional visible paranasal sinuses and Andre mastoid air cells are normally aerated. Orbits are unremarkable. Other: None. IMPRESSION: No acute intracranial abnormality identified. Unremarkable CT of Andre head. Electronically Signed   By: Mitzi HansenLance  Furusawa-Stratton M.D.   On: 09/01/2018 14:55   Ct Angio Chest Pe W Or Wo Contrast  Result Date: 09/01/2018 CLINICAL DATA:  Post resuscitation, cardiac arrest, concern for mediastinal hematoma EXAM: CT ANGIOGRAPHY CHEST WITH CONTRAST TECHNIQUE: Multidetector CT imaging of Andre chest was performed using Andre standard protocol during bolus administration of intravenous contrast. Multiplanar CT image reconstructions and MIPs were obtained to evaluate Andre vascular anatomy. CONTRAST:  100mL ISOVUE-370 IOPAMIDOL (ISOVUE-370) INJECTION 76% COMPARISON:  Chest radiograph, 09/01/2018 FINDINGS: Examination is generally  somewhat limited by extreme body habitus. Cardiovascular: Contrast bolus is insubstantial, main pulmonary artery 75 HU, with minimal contrast observed in Andre dependent veins of Andre left upper extremity. Cardiomegaly. No pericardial effusion. Mediastinum/Nodes: No enlarged mediastinal, hilar, or axillary lymph nodes. Thyroid gland, trachea, and esophagus demonstrate no significant findings. Lungs/Pleura: There are small left, moderate right pleural effusions with associated dependent atelectasis or consolidation. There are dense paramedian consolidations of Andre bilateral upper lobes. Endotracheal tube is positioned with tip above Andre carina. Upper Abdomen: No acute abnormality. Musculoskeletal: No chest wall abnormality. No acute or significant osseous findings. Review of Andre MIP images confirms Andre above findings. IMPRESSION: 1. Contrast bolus is insubstantial, main pulmonary artery 75 HU, with minimal contrast observed in Andre dependent veins of Andre left upper extremity. Examination is nondiagnostic for pulmonary embolism. 2. There are small left, moderate right pleural effusions with associated dependent atelectasis or consolidation. There are dense paramedian consolidations of Andre bilateral upper lobes. 3.  Cardiomegaly. 4. Endotracheal tube is positioned appropriately with tip above Andre carina. 5. No evidence of mediastinal hematoma or other resuscitation related trauma. Electronically Signed   By: Lauralyn PrimesAlex  Bibbey M.D.   On: 09/01/2018 15:12    Assessment and Plan:   1. Andre [name] is now intubated, on hypothermia, and suffered a sudden PEA cardiac arrest.  In this setting pulmonary embolism is a prime suspect.  He does not appear to be having an acute coronary syndrome but should have serial cardiac markers evaluated.  Will need to have a repeat echocardiogram done to look for change in right ventricular size which may suggest pulmonary embolism.  Would also recommend repeat electrocardiogram in a.m.  Also  possible Andre PEA arrest was  related to severe metabolic and acid-base disturbance.  No immediate current actions other than addressing whether or not anticoagulation therapy would be appropriate since Andre PE study was inadequate to exclude.  Recommend continued supportive care.  Serial cardiac markers.  Repeat echo within Andre next 24 hours.  Anticoagulation therapy.      For questions or updates, please contact [facility] Please consult [email or web address] for contact info under     Signed, Lesleigh NoeHenry W  III, MD  09/01/2018 5:53 PM

## 2018-09-01 NOTE — Progress Notes (Signed)
Vent changes made per Dr. George Hugh request following ABG results.

## 2018-09-02 ENCOUNTER — Inpatient Hospital Stay (HOSPITAL_COMMUNITY): Payer: Medicare Other

## 2018-09-02 ENCOUNTER — Other Ambulatory Visit: Payer: Self-pay

## 2018-09-02 DIAGNOSIS — R0989 Other specified symptoms and signs involving the circulatory and respiratory systems: Secondary | ICD-10-CM

## 2018-09-02 DIAGNOSIS — G9340 Encephalopathy, unspecified: Secondary | ICD-10-CM

## 2018-09-02 DIAGNOSIS — M7989 Other specified soft tissue disorders: Secondary | ICD-10-CM

## 2018-09-02 DIAGNOSIS — I469 Cardiac arrest, cause unspecified: Secondary | ICD-10-CM

## 2018-09-02 DIAGNOSIS — J9601 Acute respiratory failure with hypoxia: Secondary | ICD-10-CM

## 2018-09-02 LAB — BASIC METABOLIC PANEL
Anion gap: 8 (ref 5–15)
Anion gap: 12 (ref 5–15)
Anion gap: 12 (ref 5–15)
BUN: 23 mg/dL — ABNORMAL HIGH (ref 6–20)
BUN: 25 mg/dL — ABNORMAL HIGH (ref 6–20)
BUN: 27 mg/dL — ABNORMAL HIGH (ref 6–20)
CO2: 44 mmol/L — ABNORMAL HIGH (ref 22–32)
CO2: 39 mmol/L — ABNORMAL HIGH (ref 22–32)
CO2: 39 mmol/L — ABNORMAL HIGH (ref 22–32)
Calcium: 8.5 mg/dL — ABNORMAL LOW (ref 8.9–10.3)
Calcium: 8.6 mg/dL — ABNORMAL LOW (ref 8.9–10.3)
Calcium: 8.6 mg/dL — ABNORMAL LOW (ref 8.9–10.3)
Chloride: 87 mmol/L — ABNORMAL LOW (ref 98–111)
Chloride: 87 mmol/L — ABNORMAL LOW (ref 98–111)
Chloride: 88 mmol/L — ABNORMAL LOW (ref 98–111)
Creatinine, Ser: 1.47 mg/dL — ABNORMAL HIGH (ref 0.61–1.24)
Creatinine, Ser: 1.7 mg/dL — ABNORMAL HIGH (ref 0.61–1.24)
Creatinine, Ser: 1.83 mg/dL — ABNORMAL HIGH (ref 0.61–1.24)
GFR calc Af Amer: >60 mL/min (ref >60)
GFR calc Af Amer: 58 mL/min — ABNORMAL LOW (ref >60)
GFR calc Af Amer: 53 mL/min — ABNORMAL LOW (ref >60)
GFR calc non Af Amer: 60 mL/min — ABNORMAL LOW (ref >60)
GFR calc non Af Amer: 50 mL/min — ABNORMAL LOW (ref >60)
GFR calc non Af Amer: 46 mL/min — ABNORMAL LOW (ref >60)
Glucose, Bld: 101 mg/dL — ABNORMAL HIGH (ref 70–99)
Glucose, Bld: 74 mg/dL (ref 70–99)
Glucose, Bld: 82 mg/dL (ref 70–99)
POTASSIUM: 3.7 mmol/L (ref 3.5–5.1)
Potassium: 3.3 mmol/L — ABNORMAL LOW (ref 3.5–5.1)
Potassium: 3.7 mmol/L (ref 3.5–5.1)
SODIUM: 139 mmol/L (ref 135–145)
SODIUM: 139 mmol/L (ref 135–145)
Sodium: 138 mmol/L (ref 135–145)

## 2018-09-02 LAB — POCT I-STAT 4, (NA,K, GLUC, HGB,HCT)
Glucose, Bld: 103 mg/dL — ABNORMAL HIGH (ref 70–99)
Glucose, Bld: 93 mg/dL (ref 70–99)
Glucose, Bld: 111 mg/dL — ABNORMAL HIGH (ref 70–99)
HCT: 44 % (ref 39.0–52.0)
HCT: 42 % (ref 39.0–52.0)
HEMATOCRIT: 45 % (ref 39.0–52.0)
Hemoglobin: 15 g/dL (ref 13.0–17.0)
Hemoglobin: 14.3 g/dL (ref 13.0–17.0)
Hemoglobin: 15.3 g/dL (ref 13.0–17.0)
Potassium: 3.5 mmol/L (ref 3.5–5.1)
Potassium: 3.8 mmol/L (ref 3.5–5.1)
Potassium: 3.8 mmol/L (ref 3.5–5.1)
Sodium: 139 mmol/L (ref 135–145)
Sodium: 138 mmol/L (ref 135–145)
Sodium: 138 mmol/L (ref 135–145)

## 2018-09-02 LAB — GLUCOSE, CAPILLARY
GLUCOSE-CAPILLARY: 103 mg/dL — AB (ref 70–99)
GLUCOSE-CAPILLARY: 75 mg/dL (ref 70–99)
GLUCOSE-CAPILLARY: 75 mg/dL (ref 70–99)
Glucose-Capillary: 99 mg/dL (ref 70–99)
Glucose-Capillary: 97 mg/dL (ref 70–99)
Glucose-Capillary: 96 mg/dL (ref 70–99)
Glucose-Capillary: 89 mg/dL (ref 70–99)
Glucose-Capillary: 74 mg/dL (ref 70–99)
Glucose-Capillary: 76 mg/dL (ref 70–99)
Glucose-Capillary: 89 mg/dL (ref 70–99)
Glucose-Capillary: 112 mg/dL — ABNORMAL HIGH (ref 70–99)

## 2018-09-02 LAB — TROPONIN I
Troponin I: 0.58 ng/mL (ref <0.03)
Troponin I: 0.48 ng/mL (ref <0.03)
Troponin I: 0.39 ng/mL (ref <0.03)
Troponin I: 0.34 ng/mL (ref <0.03)

## 2018-09-02 LAB — POCT I-STAT 7, (LYTES, BLD GAS, ICA,H+H)
ACID-BASE EXCESS: 20 mmol/L — AB (ref 0.0–2.0)
Acid-Base Excess: 18 mmol/L — ABNORMAL HIGH (ref 0.0–2.0)
Bicarbonate: 48.5 mmol/L — ABNORMAL HIGH (ref 20.0–28.0)
Bicarbonate: 48.4 mmol/L — ABNORMAL HIGH (ref 20.0–28.0)
CALCIUM ION: 1.13 mmol/L — AB (ref 1.15–1.40)
Calcium, Ion: 1.13 mmol/L — ABNORMAL LOW (ref 1.15–1.40)
HCT: 41 % (ref 39.0–52.0)
HCT: 40 % (ref 39.0–52.0)
Hemoglobin: 13.9 g/dL (ref 13.0–17.0)
Hemoglobin: 13.6 g/dL (ref 13.0–17.0)
O2 SAT: 96 %
O2 Saturation: 97 %
PH ART: 7.495 — AB (ref 7.350–7.450)
PO2 ART: 65 mmHg — AB (ref 83.0–108.0)
Patient temperature: 33
Potassium: 3.5 mmol/L (ref 3.5–5.1)
Potassium: 3.7 mmol/L (ref 3.5–5.1)
Sodium: 138 mmol/L (ref 135–145)
Sodium: 138 mmol/L (ref 135–145)
TCO2: >50 mmol/L — ABNORMAL HIGH (ref 22–32)
TCO2: >50 mmol/L — ABNORMAL HIGH (ref 22–32)
pCO2 arterial: 71.4 mmHg (ref 32.0–48.0)
pCO2 arterial: 60.5 mmHg — ABNORMAL HIGH (ref 32.0–48.0)
pH, Arterial: 7.423 (ref 7.350–7.450)
pO2, Arterial: 83 mmHg (ref 83.0–108.0)

## 2018-09-02 LAB — CBC
HCT: 43.6 % (ref 39.0–52.0)
Hemoglobin: 12.6 g/dL — ABNORMAL LOW (ref 13.0–17.0)
MCH: 26 pg (ref 26.0–34.0)
MCHC: 28.9 g/dL — ABNORMAL LOW (ref 30.0–36.0)
MCV: 90.1 fL (ref 80.0–100.0)
Platelets: 144 10*3/uL — ABNORMAL LOW (ref 150–400)
RBC: 4.84 MIL/uL (ref 4.22–5.81)
RDW: 20 % — ABNORMAL HIGH (ref 11.5–15.5)
WBC: 7.3 10*3/uL (ref 4.0–10.5)
nRBC: 0 % (ref 0.0–0.2)

## 2018-09-02 LAB — ECHOCARDIOGRAM COMPLETE
Height: 66 in
Weight: 7792.29 oz

## 2018-09-02 LAB — MAGNESIUM
MAGNESIUM: 2.1 mg/dL (ref 1.7–2.4)
Magnesium: 1.9 mg/dL (ref 1.7–2.4)

## 2018-09-02 LAB — PHOSPHORUS
PHOSPHORUS: 3.7 mg/dL (ref 2.5–4.6)
Phosphorus: 3.9 mg/dL (ref 2.5–4.6)

## 2018-09-02 LAB — PROTIME-INR
INR: 1.25
Prothrombin Time: 15.6 seconds — ABNORMAL HIGH (ref 11.4–15.2)

## 2018-09-02 LAB — HEPARIN LEVEL (UNFRACTIONATED)
HEPARIN UNFRACTIONATED: 0.32 [IU]/mL (ref 0.30–0.70)
Heparin Unfractionated: 0.32 IU/mL (ref 0.30–0.70)

## 2018-09-02 MED ORDER — VITAL HIGH PROTEIN PO LIQD
1000.0000 mL | ORAL | Status: DC
  Administered 2018-09-02 – 2018-09-04 (×5): 1000 mL

## 2018-09-02 MED ORDER — SODIUM CHLORIDE 0.9 % IV SOLN
2.0000 g | INTRAVENOUS | Status: DC
  Administered 2018-09-02 – 2018-09-03 (×2): 2 g via INTRAVENOUS
  Filled 2018-09-02 (×3): qty 20

## 2018-09-02 MED ORDER — PRO-STAT SUGAR FREE PO LIQD
30.0000 mL | Freq: Two times a day (BID) | ORAL | Status: DC
  Administered 2018-09-02 – 2018-09-07 (×8): 30 mL
  Filled 2018-09-02 (×9): qty 30

## 2018-09-02 MED ORDER — HEPARIN (PORCINE) 25000 UT/250ML-% IV SOLN
2800.0000 [IU]/h | INTRAVENOUS | Status: DC
  Administered 2018-09-02: 1000 [IU]/h via INTRAVENOUS
  Administered 2018-09-03: 1400 [IU]/h via INTRAVENOUS
  Administered 2018-09-04: 2450 [IU]/h via INTRAVENOUS
  Administered 2018-09-04: 1800 [IU]/h via INTRAVENOUS
  Administered 2018-09-05 – 2018-09-07 (×6): 2450 [IU]/h via INTRAVENOUS
  Administered 2018-09-07 – 2018-09-08 (×2): 2500 [IU]/h via INTRAVENOUS
  Filled 2018-09-02 (×12): qty 250

## 2018-09-02 MED ORDER — FLUCONAZOLE 200 MG PO TABS
200.0000 mg | ORAL_TABLET | Freq: Every day | ORAL | Status: DC
  Filled 2018-09-02: qty 1

## 2018-09-02 MED ORDER — POTASSIUM CHLORIDE 10 MEQ/50ML IV SOLN
10.0000 meq | INTRAVENOUS | Status: AC
  Administered 2018-09-02 (×4): 10 meq via INTRAVENOUS
  Filled 2018-09-02 (×4): qty 50

## 2018-09-02 MED ORDER — ASPIRIN 300 MG RE SUPP
150.0000 mg | Freq: Every day | RECTAL | Status: DC
  Administered 2018-09-02: 150 mg via RECTAL
  Filled 2018-09-02 (×2): qty 1

## 2018-09-02 MED ORDER — BUDESONIDE 0.5 MG/2ML IN SUSP
0.5000 mg | Freq: Two times a day (BID) | RESPIRATORY_TRACT | Status: DC
  Administered 2018-09-02 – 2018-09-08 (×12): 0.5 mg via RESPIRATORY_TRACT
  Filled 2018-09-02 (×13): qty 2

## 2018-09-02 NOTE — Progress Notes (Signed)
ANTICOAGULATION CONSULT NOTE  Pharmacy Consult for heparin  Indication: Rule-out PE  Allergies  Allergen Reactions  . Other Hives    Steroid, but cannot remember name.    Patient Measurements: Height: 5\' 6"  (167.6 cm) Weight: (!) 487 lb 0.3 oz (220.9 kg) IBW/kg (Calculated) : 63.8 Heparin Dosing Weight: 122 kg   Vital Signs: Temp: 91.4 F (33 C) (02/19 2200) Temp Source: Esophageal (02/19 2000) BP: 117/64 (02/19 2200) Pulse Rate: 65 (02/19 1935)  Labs: Recent Labs    09/01/18 0532  09/01/18 1304  09/02/18 0105  09/02/18 0430 09/02/18 0433 09/02/18 0647 09/02/18 1103 09/02/18 1316 09/02/18 1522 09/02/18 1700 09/02/18 1852 09/02/18 2048 09/02/18 2049  HGB 12.9*  --   --    < >  --    < > 12.6* 15.0  --   --  13.6  --   --   --   --  14.3  HCT 46.4  --   --    < >  --    < > 43.6 44.0  --   --  40.0  --   --   --   --  42.0  PLT 152  --   --   --   --   --  144*  --   --   --   --   --   --   --   --   --   APTT  --   --  30  --   --   --   --   --   --   --   --   --   --   --   --   --   LABPROT  --   --  15.4*  --  15.6*  --   --   --   --   --   --   --   --   --   --   --   INR  --   --  1.24  --  1.25  --   --   --   --   --   --   --   --   --   --   --   HEPARINUNFRC  --   --   --   --   --   --   --   --   --   --   --   --   --  0.32 0.32  --   CREATININE 0.48*  --  0.81   < >  --   --  1.47*  --   --  1.70*  --   --  1.83*  --   --   --   TROPONINI  --    < > 0.04*  --   --   --  0.58*  --  0.48* 0.39*  --  0.34*  --   --   --   --    < > = values in this interval not displayed.    Estimated Creatinine Clearance: 98 mL/min (A) (by C-G formula based on SCr of 1.83 mg/dL (H)).   Medical History: Past Medical History:  Diagnosis Date  . Anemia    iron deficient  . Cellulitis    lower extremities  . Cellulitis of scrotum    History of  . CHF (congestive heart failure) (HCC)   . History of acute bronchitis with bronchospasm   .  Hypertension   .  Morbid obesity (HCC)   . Obesity hypoventilation syndrome (HCC)   . Obstructive sleep apnea   . Panniculitis    History of  . Sleep apnea     Medications:  Scheduled:  . artificial tears  1 application Both Eyes Q8H  . aspirin  150 mg Rectal Daily  . budesonide (PULMICORT) nebulizer solution  0.5 mg Nebulization BID  . chlorhexidine  15 mL Mouth Rinse BID  . feeding supplement (PRO-STAT SUGAR FREE 64)  30 mL Per Tube BID  . feeding supplement (VITAL HIGH PROTEIN)  1,000 mL Per Tube Q24H  . fentaNYL (SUBLIMAZE) injection  100 mcg Intravenous Once  . [START ON 09/03/2018] fluconazole  200 mg Oral Daily  . ipratropium-albuterol  3 mL Nebulization Q6H  . mouth rinse  15 mL Mouth Rinse q12n4p  . mouth rinse  15 mL Mouth Rinse 10 times per day  . nystatin cream   Topical BID  . pantoprazole (PROTONIX) IV  40 mg Intravenous QHS  . potassium chloride  20 mEq Oral BID  . sodium chloride flush  3 mL Intravenous Q12H   Infusions:  . sodium chloride Stopped (09/01/18 0757)  . cefTRIAXone (ROCEPHIN)  IV Stopped (09/02/18 1304)  . cisatracurium (NIMBEX) infusion 9.5 mcg/kg/min (09/02/18 2000)  . fentaNYL infusion INTRAVENOUS 400 mcg/hr (09/02/18 2000)  . heparin 1,000 Units/hr (09/02/18 2000)  . midazolam (VERSED) 50mg  in NS 54mL (1mg /ml) premix infusion 9 mg/hr (09/02/18 2000)  . norepinephrine (LEVOPHED) Adult infusion 5 mcg/min (09/02/18 1513)    Assessment: 37 yoM s/p code blue with PEA arrest initially,ventricular fibrillation and at one point this looks like torsades. Received VTE prophylaxis dose of enox 100 mg SQ, last on 2/19 at 0800.   CT neg for mediastinal hematoma, head CT neg for bleed. CT chest was completed that was non-diagnostic of PE (insufficient contrast). Hgb 15, plt 144. Currently undergoing TTM for cardiac arrest. No s/sx of bleeding.  Heparin running through swan 1000 uts/hr HL 0.32 at goal -  drawn from [facility]   Goals: Heparin level: 0.3-0.7  Monitor pltc     Plan:  Discontinue enoxaparin  Continue  heparin infusion at 1000 units/hr Monitor daily HL, CBC, and for s/sx of bleeding   Leota Sauers Pharm.D. CPP, BCPS Clinical Pharmacist [phone] 09/02/2018 10:17 PM

## 2018-09-02 NOTE — Progress Notes (Signed)
Temp foley was attempted x2. MD was notified. NP was at the bedside. Unable to insert foley at this time. Urology will be consulted tomorrow.

## 2018-09-02 NOTE — Progress Notes (Signed)
Initial Nutrition Assessment  DOCUMENTATION CODES:   Morbid obesity  INTERVENTION:   -Vital High Protein @ 20 ml/hr via OGT -Increase by 10 ml Q 8 hours to goal rate of 60 ml/hr (1440 ml) -30 ml Prostat BID  Provides: 1640 kcals, 156 grams protein, 1204 ml free water. Meets 102% kcal needs and 100% of protein needs.   NUTRITION DIAGNOSIS:   Inadequate oral intake related to inability to eat as evidenced by NPO status.  GOAL:   Provide needs based on ASPEN/SCCM guidelines  MONITOR:   Diet advancement, Vent status, Skin, TF tolerance, Weight trends, Labs, I & O's  REASON FOR ASSESSMENT:   Ventilator    ASSESSMENT:   Patient with PMH significant for morbid obesity, chronic lymphedema, panniculitis, CHF, OSA requiring CPAP, and HTN. Presented to [facility] (2/15) with acute exacerbation of CHF and cellulitis. Transferred to [facility] (2/18) after PEA arrest from plausible coronary or thromboembolic event.    2/18- intubated  Pt currently on TTM. Remains on nimbex. Family at bedside denies pt had loss in appetite PTA. They are concerned about nutrition as he was NPO three days prior to arrest. CCM okay to start TF.   Records show pt's weight has fluctuated significantly throughout the year, likely related to fluid accumulation/loss. Family unaware of any unintentional wt loss. Will utilized EDW of 217 kg.   Patient is currently intubated on ventilator support MV: 9.0 L/min Temp (24hrs), Avg:92.5 F (33.6 C), Min:90.5 F (32.5 C), Max:98 F (36.7 C) BP: 112/58 MAP: 72 (A- line)  I/O: -2878 ml since admit UOP: 1x occurrence OGT: 50 ml x 24 hrs   Medications reviewed and include: 20 mEq KCl BID, nimbex Labs reviewed: calcium ionized 1.13 (L)    NUTRITION - FOCUSED PHYSICAL EXAM:    Most Recent Value  Orbital Region  No depletion  Upper Arm Region  No depletion  Thoracic and Lumbar Region  Unable to assess  Buccal Region  Unable to assess  Temple Region  No depletion   Clavicle Bone Region  No depletion  Clavicle and Acromion Bone Region  No depletion  Scapular Bone Region  Unable to assess  Dorsal Hand  No depletion  Patellar Region  No depletion  Anterior Thigh Region  No depletion  Posterior Calf Region  No depletion  Edema (RD Assessment)  Moderate  Hair  Reviewed  Eyes  Unable to assess  Mouth  Unable to assess  Skin  Reviewed  Nails  Reviewed     Diet Order:   Diet Order            Diet - low sodium heart healthy              EDUCATION NEEDS:   Not appropriate for education at this time  Skin:  Skin Assessment: Skin Integrity Issues: Skin Integrity Issues:: Other (Comment) Other: cracking cellulitis bilateral heels, MASD- groin/scrotum  Last BM:  2/17  Height:   Ht Readings from Last 1 Encounters:  09/02/18 5\' 6"  (1.676 m)    Weight:   Wt Readings from Last 1 Encounters:  09/02/18 (!) 220.9 kg    Ideal Body Weight:  64.5 kg  BMI:  Body mass index is 78.61 kg/m.  Estimated Nutritional Needs:   Kcal:  1419-1613 kcal  Protein:  129-162 grams  Fluid:  >/= 1.5 L/day   Vanessa Kick RD, LDN Clinical Nutrition Pager # - [phone]

## 2018-09-02 NOTE — Progress Notes (Signed)
  Echocardiogram 2D Echocardiogram has been performed.  Delcie Roch 09/02/2018, 4:35 PM

## 2018-09-02 NOTE — Progress Notes (Signed)
Bilateral lower extremity venous duplex has been completed. Preliminary results can be found in CV Proc through chart review.   09/02/18 3:51 PM Olen Cordial RVT

## 2018-09-02 NOTE — Plan of Care (Signed)
  Problem: Clinical Measurements: Goal: Ability to maintain clinical measurements within normal limits will improve Outcome: Progressing   Problem: Nutrition: Goal: Adequate nutrition will be maintained Outcome: Progressing   Problem: Elimination: Goal: Will not experience complications related to urinary retention Outcome: Progressing   Problem: Cardiac: Goal: Ability to achieve and maintain adequate cardiopulmonary perfusion will improve Outcome: Not Progressing   Problem: Skin Integrity: Goal: Risk for impaired skin integrity will be minimized. Outcome: Not Progressing

## 2018-09-02 NOTE — Progress Notes (Addendum)
The patient has been seen in conjunction with Levora Dredge, MD. All aspects of care have been considered and discussed. The patient has been personally interviewed, examined, and all clinical data has been reviewed.   Status post successful resuscitation from PEA cardiac arrest.  Probably related to severe acid-base disturbance.  CT scan was not adequate to exclude the possibility of massive pulmonary embolism although clinically not behaving in such a manner.  An echo will be done today to reassess the right ventricular size and rule out evidence of strain.  Cardiac markers and EKGs do not support the concept of acute coronary syndrome/ischemia as a significant contributor to his cardiac event.  Remaining question is anticoagulation and whether all are satisfied the PE has been excluded.   Progress Note  Patient Name: Andre [name] Date of Encounter: 09/02/2018  Primary Cardiologist: No primary care provider on file.   Subjective    Patient intubated, sedated, and on hypothermia protocol. Mother at bedside. Discussed CTA was inconclusive. Treating with anticoagulation and getting repeat echo. All questions and concerns addressed.   Inpatient Medications    Scheduled Meds: . artificial tears  1 application Both Eyes Q8H  . aspirin  150 mg Rectal Daily  . chlorhexidine  15 mL Mouth Rinse BID  . enoxaparin (LOVENOX) injection  100 mg Subcutaneous Q24H  . fentaNYL (SUBLIMAZE) injection  100 mcg Intravenous Once  . fluconazole  200 mg Oral Daily  . ipratropium-albuterol  3 mL Nebulization Q6H  . mouth rinse  15 mL Mouth Rinse q12n4p  . mouth rinse  15 mL Mouth Rinse 10 times per day  . nystatin cream   Topical BID  . pantoprazole (PROTONIX) IV  40 mg Intravenous QHS  . potassium chloride  20 mEq Oral BID  . sodium chloride flush  3 mL Intravenous Q12H   Continuous Infusions: . sodium chloride Stopped (09/01/18 0757)  . ampicillin-sulbactam (UNASYN) IV 3 g (09/02/18  0530)  . cisatracurium (NIMBEX) infusion 2 mcg/kg/min (09/01/18 2000)  . fentaNYL infusion INTRAVENOUS 300 mcg/hr (09/01/18 2000)  . midazolam (VERSED) 50mg  in NS 9mL (1mg /ml) premix infusion 8 mg/hr (09/01/18 2000)  . norepinephrine (LEVOPHED) Adult infusion 6 mcg/min (09/01/18 1530)  . potassium chloride 10 mEq (09/02/18 0748)  . sodium chloride     PRN Meds: sodium chloride, albuterol, [COMPLETED] cisatracurium **AND** cisatracurium (NIMBEX) infusion **AND** cisatracurium, fentaNYL, midazolam   Vital Signs    Vitals:   09/02/18 0600 09/02/18 0630 09/02/18 0700 09/02/18 0745  BP:    (!) 115/59  Pulse:    60  Resp: 17 17 17 19   Temp:  (!) 91.8 F (33.2 C)    TempSrc:      SpO2: 99% 99% 97% 100%  Weight:      Height:        Intake/Output Summary (Last 24 hours) at 09/02/2018 0816 Last data filed at 09/02/2018 0600 Gross per 24 hour  Intake 2105.7 ml  Output 50 ml  Net 2055.7 ml   Filed Weights   09/01/18 0425 09/01/18 1700 09/02/18 0500  Weight: (!) 217.7 kg (!) 217 kg (!) 220.9 kg   Telemetry    NSR with occasional PVC - Personally Reviewed  ECG    NSR with normal axis. QT prolongation (520 msec by calculation) - Personally Reviewed  Physical Exam   Today's Vitals   09/02/18 0730 09/02/18 0745 09/02/18 0800 09/02/18 0815  BP:  (!) 115/59    Pulse:  60    Resp:  18 19 17 17   Temp:   (!) 91.2 F (32.9 C)   TempSrc:   Esophageal   SpO2: 100% 100% 99% 98%  Weight:      Height:      PainSc:       Body mass index is 78.61 kg/m.  GEN: Morbidly obese, sedated   Neck: Thick neck, unable to appreciate JVD Cardiac: RRR, no murmurs, rubs, or gallops.  Respiratory: Clear to auscultation bilaterally. GI: Soft, nontender, non-distended  MS: Moderate pitting edema; No deformity. Neuro:  Heavily sedated on hypothermia protocol Psych: Unable to assess  Labs    Chemistry Recent Labs  Lab 08/30/2018 2229  08/30/18 0857  09/01/18 1759 09/01/18 1900   09/02/18 0428 09/02/18 0430 09/02/18 0433  NA 138   < > 139   < > 138 137   < > 138 139 139  K 3.5   < > 4.4   < > 3.4* 3.3*   < > 3.5 3.3* 3.5  CL 94*   < > 88*   < > 84* 83*  --   --  87*  --   CO2 36*   < > 47*   < > 41* 41*  --   --  44*  --   GLUCOSE 110*   < > 97   < > 141* 122*  --   --  101* 103*  BUN 14   < > 13   < > 17 18  --   --  23*  --   CREATININE 0.76   < > 0.49*   < > 1.08 1.15  --   --  1.47*  --   CALCIUM 8.2*   < > 8.4*   < > 8.6* 8.9  --   --  8.5*  --   PROT 7.6  --  7.7  --   --   --   --   --   --   --   ALBUMIN 2.7*  --  2.9*  --   --   --   --   --   --   --   AST 17  --  14*  --   --   --   --   --   --   --   ALT 17  --  13  --   --   --   --   --   --   --   ALKPHOS 97  --  111  --   --   --   --   --   --   --   BILITOT 1.2  --  1.0  --   --   --   --   --   --   --   GFRNONAA >60   < > >60   < > >60 >60  --   --  60*  --   GFRAA >60   < > >60   < > >60 >60  --   --  >60  --   ANIONGAP 8   < > 4*   < > 13 13  --   --  8  --    < > = values in this interval not displayed.    Hematology Recent Labs  Lab 08/30/18 0857 09/01/18 0532  09/02/18 0428 09/02/18 0430 09/02/18 0433  WBC 6.9 8.7  --   --  7.3  --   RBC 4.69  4.91  --   --  4.84  --   HGB 12.5* 12.9*   < > 13.9 12.6* 15.0  HCT 46.5 46.4   < > 41.0 43.6 44.0  MCV 99.1 94.5  --   --  90.1  --   MCH 26.7 26.3  --   --  26.0  --   MCHC 26.9* 27.8*  --   --  28.9*  --   RDW 20.8* 20.0*  --   --  20.0*  --   PLT 159 152  --   --  144*  --    < > = values in this interval not displayed.   Cardiac Enzymes Recent Labs  Lab 09/01/18 1304 09/02/18 0430 09/02/18 0647  TROPONINI 0.04* 0.58* 0.48*   No results for input(s): TROPIPOC in the last 168 hours.   BNP Recent Labs  Lab 08/16/2018 2229  BNP 235.6*    DDimer No results for input(s): DDIMER in the last 168 hours.   Radiology    Dg Chest 1 View  Result Date: 09/01/2018 CLINICAL DATA:  Difficult intubation. EXAM: CHEST  1 VIEW  COMPARISON:  Radiographs of August 31, 2018. FINDINGS: Stable cardiomegaly with central pulmonary vascular congestion and possible pulmonary edema. Nasogastric tube is seen entering the stomach. The endotracheal tube is seen projected over tracheal air shadow, but it is difficult to evaluate its distal tip due to body habitus. It appears to be at least several cm above the carina. Right internal jugular catheter is noted with tip in expected position of right innominate vein. There is widening of the right paratracheal region which may represent mass or possibly hematoma. No pneumothorax or effusion is noted. Visualized bony thorax is unremarkable. IMPRESSION: Widening of the right paratracheal region is noted concerning for possible mediastinal mass or hematoma. CT scan of the chest with intravenous contrast is recommended for further evaluation. Nasogastric tube appears to be in grossly good position. Endotracheal tube is projected over tracheal air shadow, but distal tip is not well visualized due to body habitus, but it appears to be at least several cm above the carina. Repeat radiograph is recommended. These results will be called to the ordering clinician or representative by the Radiologist Assistant, and communication documented in the PACS or zVision Dashboard. Stable cardiomegaly is noted with central pulmonary vascular congestion and probable bilateral pulmonary edema. Electronically Signed   By: Lupita RaiderJames  Green Jr, M.D.   On: 09/01/2018 13:56   Dg Chest 1 View  Result Date: 08/31/2018 CLINICAL DATA:  Hypoxemia. EXAM: CHEST  1 VIEW COMPARISON:  08/30/2018 FINDINGS: Cardiac silhouette is mildly enlarged. There is vascular congestion with opacity central to lower lungs similar to the previous day's exam, greater at the right base. No convincing pneumonia. IMPRESSION: 1. No significant change from the prior day's study. Cardiomegaly with vascular congestion and mild hazy airspace opacities consistent  with congestive heart failure. No new abnormalities. Electronically Signed   By: Amie Portlandavid  Ormond M.D.   On: 08/31/2018 15:04   Ct Head Wo Contrast  Result Date: 09/01/2018 CLINICAL DATA:  38 y/o M; episode of pulseless arrest post 20 minutes CPR and defibrillation. Persistent unresponsiveness. EXAM: CT HEAD WITHOUT CONTRAST TECHNIQUE: Contiguous axial images were obtained from the base of the skull through the vertex without intravenous contrast. COMPARISON:  None. FINDINGS: Brain: No evidence of acute infarction, hemorrhage, hydrocephalus, extra-axial collection or mass lesion/mass effect. Vascular: No hyperdense vessel or unexpected calcification. Skull: Normal. Negative for fracture or focal  lesion. Sinuses/Orbits: Ethmoid air cell mucosal thickening. Additional visible paranasal sinuses and the mastoid air cells are normally aerated. Orbits are unremarkable. Other: None. IMPRESSION: No acute intracranial abnormality identified. Unremarkable CT of the head. Electronically Signed   By: Mitzi Hansen M.D.   On: 09/01/2018 14:55   Ct Angio Chest Pe W Or Wo Contrast  Result Date: 09/01/2018 CLINICAL DATA:  Post resuscitation, cardiac arrest, concern for mediastinal hematoma EXAM: CT ANGIOGRAPHY CHEST WITH CONTRAST TECHNIQUE: Multidetector CT imaging of the chest was performed using the standard protocol during bolus administration of intravenous contrast. Multiplanar CT image reconstructions and MIPs were obtained to evaluate the vascular anatomy. CONTRAST:  ISOVUE-370 IOPAMIDOL (ISOVUE-370) INJECTION 76% COMPARISON:  Chest radiograph, 09/01/2018 FINDINGS: Examination is generally somewhat limited by extreme body habitus. Cardiovascular: Contrast bolus is insubstantial, main pulmonary artery 75 HU, with minimal contrast observed in the dependent veins of the left upper extremity. Cardiomegaly. No pericardial effusion. Mediastinum/Nodes: No enlarged mediastinal, hilar, or axillary lymph nodes.  Thyroid gland, trachea, and esophagus demonstrate no significant findings. Lungs/Pleura: There are small left, moderate right pleural effusions with associated dependent atelectasis or consolidation. There are dense paramedian consolidations of the bilateral upper lobes. Endotracheal tube is positioned with tip above the carina. Upper Abdomen: No acute abnormality. Musculoskeletal: No chest wall abnormality. No acute or significant osseous findings. Review of the MIP images confirms the above findings. IMPRESSION: 1. Contrast bolus is insubstantial, main pulmonary artery 75 HU, with minimal contrast observed in the dependent veins of the left upper extremity. Examination is nondiagnostic for pulmonary embolism. 2. There are small left, moderate right pleural effusions with associated dependent atelectasis or consolidation. There are dense paramedian consolidations of the bilateral upper lobes. 3.  Cardiomegaly. 4. Endotracheal tube is positioned appropriately with tip above the carina. 5. No evidence of mediastinal hematoma or other resuscitation related trauma. Electronically Signed   By: Lauralyn Primes M.D.   On: 09/01/2018 15:12   Cardiac Studies   TTE 08/29/2018  1. The left ventricle has hyperdynamic systolic function, with an ejection fraction of >65%. The cavity size was mildly dilated. There is mildly increased left ventricular wall thickness. Left ventricular diastolic Doppler parameters are consistent with  pseudonormalization Elevated left ventricular end-diastolic pressure The E/e' is 68.1.  2. The right ventricle has normal systolic function. The cavity was normal. There is no increase in right ventricular wall thickness. Right ventricular systolic pressure could not be assessed.  3. Left atrial size was severely dilated.  4. The mitral valve is normal in structure.  5. The tricuspid valve is normal in structure.  6. The aortic valve is tricuspid Mild sclerosis of the aortic valve.  7. The  pulmonic valve was normal in structure.  8. The inferior vena cava was dilated in size with >50% respiratory variability.  9. Right atrial pressure is estimated at 8 mmHg. 10. The interatrial septum appears to be lipomatous.  Patient Profile     Andre [name] is a 38 y.o. male with a hx of obesity hypoventilation syndrome with chronic hypercapnic respiratory failure who is being seen for the evaluation of PEA cardiac arrest.  Assessment & Plan    PEA Arrest with subsequent V-fib Acute on Chronic Hypercarbic Respiratory Failure - Treating for presumed PE vs aspiration event  - ABG 7.4/87/93/ >50 prior to code. This is largely chronic but there is a acute component to his respiratory acidosis compensated with metabolic alkalosis given pH is 7.4  - Troponin down trend  from 0.58  - EKG unchanged from prior  - Repeat echocardiogram today  - Currently on hypothermia protocol with plan to begin rewarming this evening.  - Norepi currently on hold  Will discuss the case further with Dr. Katrinka BlazingSmith.   For questions or updates, please contact [facility] Please consult [email or web address] for contact info under Cardiology/STEMI.   Signed, Levora DredgeJustin Helberg, MD  09/02/2018, 8:16 AM

## 2018-09-02 NOTE — Progress Notes (Signed)
   09/02/18 1100  Clinical Encounter Type  Visited With Patient;Family  Visit Type Initial  Referral From Nurse  Consult/Referral To Chaplain  Spiritual Encounters  Spiritual Needs Emotional  Stress Factors  Patient Stress Factors Not reviewed  Family Stress Factors Exhausted   Responded to follow up consult. PT was not responsive and Nurse was tending him. Nurse (stated she was concerned about mothers self care) escorted me to [facility] waiting area where family was. Mother Coralyn Mark) met me and stated that she is was doing ok and that her family was there for support. She stated that she really appreciated the precious Chaplains presence and thanked me for the follow up. She also said that if she needs further care that she will let Nurse know. Chaplain available as needed. Chaplain Fidel Levy [phone]

## 2018-09-02 NOTE — Procedures (Signed)
DATE: 09/02/2018  PROCEDURES: - Difficult Foley catheter placement - Meatal dilation  INDICATIONS: 38 y.o. male with morbid obesity with PEA arrest. Patient not voiding spontaneously and bladder scans not felt to be reliable in patient of this size. Nursing unable to place catheter for accurate assessment of I&Os due to patient obesity and buried penis. Urology requested for catheter placement.  SPECIMEN: None  DRAINS: 16Fr 2-way Foley catheter (10 mL sterile water in balloon)  DESCRIPTION: Patient prepped and draped in sterile fashion. With the assistance of multiple nurses for retraction and with firm pressure to retract the diseased skin, the meatus was able to be visualized. The meatus was notably stenotic and hemostats were used to gently dilate the meatus only enough to allow passage of a 16Fr 2-way Foley catheter. There was immediate return of clear yellow urine. 64mL sterile water instilled into the balloon. Drainage bag connected. Patient tolerated the procedure well.  PLAN: Do not attempt catheter exchange without discussing with Urology. Please contact urology prior to consideration of trial of void.

## 2018-09-02 NOTE — Progress Notes (Signed)
ANTICOAGULATION CONSULT NOTE  Pharmacy Consult for heparin  Indication: Rule-out PE  Allergies  Allergen Reactions  . Other Hives    Steroid, but cannot remember name.    Patient Measurements: Height: 5\' 6"  (167.6 cm) Weight: (!) 487 lb 0.3 oz (220.9 kg) IBW/kg (Calculated) : 63.8 Heparin Dosing Weight: 122 kg   Vital Signs: Temp: 91.9 F (33.3 C) (02/19 1200) Temp Source: Esophageal (02/19 1200) BP: 110/59 (02/19 1147) Pulse Rate: 60 (02/19 1147)  Labs: Recent Labs    09/01/18 0532  09/01/18 1304  09/01/18 1900  09/02/18 0105 09/02/18 0428 09/02/18 0430 09/02/18 0433 09/02/18 0647 09/02/18 1103  HGB 12.9*  --   --   --   --    < >  --  13.9 12.6* 15.0  --   --   HCT 46.4  --   --   --   --    < >  --  41.0 43.6 44.0  --   --   PLT 152  --   --   --   --   --   --   --  144*  --   --   --   APTT  --   --  30  --   --   --   --   --   --   --   --   --   LABPROT  --   --  15.4*  --   --   --  15.6*  --   --   --   --   --   INR  --   --  1.24  --   --   --  1.25  --   --   --   --   --   CREATININE 0.48*  --  0.81   < > 1.15  --   --   --  1.47*  --   --  1.70*  TROPONINI  --    < > 0.04*  --   --   --   --   --  0.58*  --  0.48* 0.39*   < > = values in this interval not displayed.    Estimated Creatinine Clearance: 105.5 mL/min (A) (by C-G formula based on SCr of 1.7 mg/dL (H)).   Medical History: Past Medical History:  Diagnosis Date  . Anemia    iron deficient  . Cellulitis    lower extremities  . Cellulitis of scrotum    History of  . CHF (congestive heart failure) (HCC)   . History of acute bronchitis with bronchospasm   . Hypertension   . Morbid obesity (HCC)   . Obesity hypoventilation syndrome (HCC)   . Obstructive sleep apnea   . Panniculitis    History of  . Sleep apnea     Medications:  Scheduled:  . artificial tears  1 application Both Eyes Q8H  . aspirin  150 mg Rectal Daily  . chlorhexidine  15 mL Mouth Rinse BID  . enoxaparin  (LOVENOX) injection  100 mg Subcutaneous Q24H  . fentaNYL (SUBLIMAZE) injection  100 mcg Intravenous Once  . [START ON 09/03/2018] fluconazole  200 mg Oral Daily  . ipratropium-albuterol  3 mL Nebulization Q6H  . mouth rinse  15 mL Mouth Rinse q12n4p  . mouth rinse  15 mL Mouth Rinse 10 times per day  . nystatin cream   Topical BID  . pantoprazole (PROTONIX) IV  40 mg  Intravenous QHS  . potassium chloride  20 mEq Oral BID  . sodium chloride flush  3 mL Intravenous Q12H   Infusions:  . sodium chloride Stopped (09/01/18 0757)  . cefTRIAXone (ROCEPHIN)  IV    . cisatracurium (NIMBEX) infusion 4 mcg/kg/min (09/02/18 1200)  . fentaNYL infusion INTRAVENOUS 350 mcg/hr (09/02/18 1146)  . midazolam (VERSED) 50mg  in NS 52mL (1mg /ml) premix infusion 8 mg/hr (09/02/18 1055)  . norepinephrine (LEVOPHED) Adult infusion 6 mcg/min (09/01/18 1530)    Assessment: 37 yoM s/p code blue with PEA arrest initially,ventricular fibrillation and at one point this looks like torsades. Received VTE prophylaxis dose of enox 100 mg SQ, last on 2/19 at 0800.   CT neg for mediastinal hematoma, head CT neg for bleed. CT chest was completed that was non-diagnostic of PE (insufficient contrast). Hgb 15, plt 144. Currently undergoing TTM for cardiac arrest. No s/sx of bleeding.    Goals: Heparin level: 0.3-0.7   Plan:  Discontinue enoxaparin  Given recent dosing in setting of TTM, will just start heparin infusion at 1000 units/hr Will order heparin level in 6 hours Monitor daily HL, CBC, and for s/sx of bleeding F/u plans for CT imaging   Sherron Monday, PharmD, BCCCP Clinical Pharmacist  Pager: [phone] Phone: [phone] 09/02/2018 12:23 PM

## 2018-09-02 NOTE — Progress Notes (Signed)
NAME:  Andre [name], MRN:  [id number], DOB:  1981-06-09, LOS: 5 ADMISSION DATE:  09-22-2018, CONSULTATION DATE: 08/31/2018 REFERRING MD: Ashley Royalty, CHIEF COMPLAINT: Hypoxemic respiratory failure  Brief History   38 y/o M admitted with shortness of breath.  Currently being treated for hypercapnic respiratory failure, obesity hypoventilation.  History of respiratory failure, obstructive sleep apnea for which he is on CPAP at home Recent treatment for cellulitis.  Past Medical History  Iron deficient anemia, chronic lymphedema, prior cellulitis, heart failure, hypertension, obesity hypoventilation syndrome, obstructive sleep apnea.  Significant Hospital Events   2/13  Admit with SOB, NIPPV 2/17  PCCM consulted for hypercarbic resp failure 2/18  Remains hypercarbic, bicarb >50, diamox added  2/18: PEA arrest, did have VT/VF episodes during this event.  Etiology unclear initially, presumptively hypoxia related.  Was difficult intubation by anesthesia.  Time to return of spontaneous circulation recorded at 20 minutes.  Received several rounds of epinephrine, 4-5 defibrillation, 2 rounds of bicarbonate, and magnesium sulfate.  Post intubation chest x-ray worrisome for peritracheal hematoma.  CT imaging ordered to rule out cerebral bleed, as well as further CT chest imaging to rule out pulmonary emboli as well as paratracheal hematoma.  Consults:  PCCM 2/17   Procedures:  OETT 2/ 18 (DIFFICULT AIRWAY per anesthesia) >> Right IJ CVL 2/18 >>  Significant Diagnostic Tests:  ECHO 2/15 >> LVEF > 65%, mildly dilated LV, RV pressure could not be assessed, LA severely dilated CT head 2/18 >> negative  CT chest 2/18 >> non-diagnostic for PE, small L, moderate R pleural effusions with dependent consolidation, cardiomegaly, no evidence of mediastinal hematoma EEG 2/19 >>  ECHO 2/19 >>  Micro Data:  MRSA PCR 2/14 >>  BCx2 2/14 >> negative   Antimicrobials:  Diflucan 2/16 >> Unasyn 2/18 >> 2/19   Ceftriaxone 2/19 >>   Interim history/subjective:  RN reports Urology unable to get foley catheter in place.  Remains on cooling protocol.  Overbreathing the vent despite sedation / paralytics.    Objective   Blood pressure (!) 115/59, pulse 60, temperature (!) 90.5 F (32.5 C), resp. rate 16, height 5\' 6"  (1.676 m), weight (!) 220.9 kg, SpO2 96 %. CVP:  [10 mmHg-20 mmHg] 20 mmHg  Vent Mode: PRVC FiO2 (%):  [70 %-100 %] 70 % Set Rate:  [18 bmp-30 bmp] 18 bmp Vt Set:  [440 mL-580 mL] 440 mL PEEP:  [5 cmH20-12 cmH20] 12 cmH20 Plateau Pressure:  [26 cmH20-32 cmH20] 31 cmH20   Intake/Output Summary (Last 24 hours) at 09/02/2018 1109 Last data filed at 09/02/2018 0800 Gross per 24 hour  Intake 2309.29 ml  Output 50 ml  Net 2259.29 ml   Filed Weights   09/01/18 0425 09/01/18 1700 09/02/18 0500  Weight: (!) 217.7 kg (!) 217 kg (!) 220.9 kg   Examination: General: obese adult male lying in bed on vent, critically ill appearing HEENT: MM pink/moist, ETT Neuro: sedate / paralyzed CV: s1s2 rrr, no m/r/g PULM: even/non-labored, lungs bilaterally with wheezing  PV:VZSM, non-tender, bsx4 active  Extremities: warm/dry, BLE edema and changes c/w chronic venous insufficiency  Skin: no rashes or lesions  Resolved Hospital Problem list     Assessment & Plan:   Acute on Chronic Hypercarbic Respiratory Failure initially 2/2 decompensated OSA / OHS further  c/b cardiopulmonary arrest. -DIFFICULT AIRWAY -Acutely hypoxic and cyanotic.  Etiology unclear.  Question cardiac event versus DVT although had been on Lovenox also possible aspiration.  Just prior to event was fully awake and  oriented so progressive hypercarbia seems unlikely.  CTA chest unable to rule out PE Tobacco Abuse  P: PRVC 8 cc/kg  Wean PEEP / FiO2 for sats >90% He may require higher PEEP due to chest wall compliance  Aspiration precautions / VAP prevention measures  May need early tracheostomy if recovers  neurologically Change abx to rocephin given salt load with unasyn / cardiomegaly  Adjust sedation for RASS Goal of -5 / vent synchrony  Duoneb Q6   Widening Peritracheal / Right Mediastinal Mass, ruled out -Hematoma ruled out on CT 2/18 P: Follow intermittent CXR   Cardiac Arrest -initially PEA, did have episodes of VT/VF.  R/O PE Cardiogenic shock Diastolic heart failure -Presumptively this was hypoxia.  However acute coronary event or thromboembolic event seem plausible.  There is also question of aspiration event.  Hypercarbia seems unlikely as he was fully awake and had been tolerating PCO2 in the 90s previously P: CT Chest as above  Follow cardiac enzymes  Await ECHO, LE duplex  Cardiology following  Empiric heparin gtt for possible PE Hold home antihypertensives  Trend CVP Levophed for MAP >85  Acute Encephalopathy -concern for hypoxic encephalopathy s/p prolonged time to ROSC  -CT Head negative P: Await rewarming for neuro exam  EEG pending  Hypokalemia  P: KCL replacement 2/19  Contraction Alkalosis P: Follow ABG closely, at risk for overventilation   At risk for AKI s/p cardiac arrest P: Trend BMP / urinary output Replace electrolytes as indicated Avoid nephrotoxic agents, ensure adequate renal perfusion  Urinary Incontinence -Urology was unable to place Foley catheter due to body habitus P: Place pouch over opening, attach to bedside bag  Chronic Lymphedema  P: Hold diuresis for now    Best practice:  Diet: NPO Pain/Anxiety/Delirium protocol (if indicated): per hypothermia protocol VAP protocol (if indicated): ordered DVT prophylaxis: LOVENOX  GI prophylaxis: PPI Glucose control: n/a Mobility: Bedrest Code Status: Full code Family Communication: Mother and cousin updated 2/19 at bedside Disposition:  ICU    CC Time:  35 minutes   Canary Brim, NP-C [facility] Pgr: [phone] or if no answer [phone] 09/02/2018,  11:09 AM

## 2018-09-02 NOTE — Progress Notes (Signed)
Patient with no foley catheter or urinary device in place. Patient bladder scan at 11AM only showed 109cc. Veleta Miners, NP and Everardo All, MD made aware.   At 1700 patient still with no urine output and bladder scan shows 117cc. Will continue to monitor.

## 2018-09-02 NOTE — Procedures (Signed)
History: 38 year old male status post cardiac arrest, undergoing hypothermia  Sedation: Fentanyl and Versed  Technique: This is a 21 channel routine scalp EEG performed at the bedside with bipolar and monopolar montages arranged in accordance to the international 10/20 system of electrode placement. One channel was dedicated to EKG recording.    Background: The background consists predominantly of diffuse delta and theta activities.  There are occasional runs of sleep spindles that are seen at times.  This pattern is fairly uniform throughout the recording.  Photic stimulation: Physiologic driving is not performed  EEG Abnormalities: Sedated EEG  Clinical Interpretation: This EEG is consistent with the patient's sedated state. There was no seizure or seizure predisposition recorded on this study. Please note that lack of epileptiform activity on EEG does not preclude the possibility of epilepsy.   Ritta Slot, MD [facility] [phone]  If 7pm- 7am, please page neurology on call as listed in AMION.

## 2018-09-02 NOTE — Progress Notes (Signed)
   09/02/18 0900  Clinical Encounter Type  Visited With Family  Visit Type Follow-up;Psychological support;Spiritual support  Referral From Family  Consult/Referral To Chaplain  Spiritual Encounters  Spiritual Needs Emotional;Other (Comment) (Spiritual Care Consult/Conversation)  Stress Factors  Patient Stress Factors Not reviewed  Family Stress Factors Health changes;Major life changes;Other (Comment)   I had worked with the patient's family, mother, brother and girlfriend yesterday during a code.  The patient's mother and girlfriend were at the bedside. The patient's girlfriend was going through grief over the loss of her husband two years ago and the patient's condition reminded her of that event.  I contacted the patient's mother this morning as a follow-up and asked if she would like a visit from the Chaplain at [facility], which she stated she would.  The patient's mother remains hopeful that the patient will get better. She stated multiple times how much she "needs him." She continues to have high anxiety, but appreciates the support from Spiritual Care.   Please, contact Spiritual Care for further assistance.   Chaplain Clint Bolder M.Div., [facility]

## 2018-09-02 NOTE — Progress Notes (Signed)
EEG Completed; Results Pending  

## 2018-09-02 NOTE — Plan of Care (Signed)
  Problem: Safety: Goal: Ability to remain free from injury will improve Outcome: Progressing   Problem: Skin Integrity: Goal: Risk for impaired skin integrity will decrease Outcome: Not Progressing   Problem: Cardiac: Goal: Ability to achieve and maintain adequate cardiopulmonary perfusion will improve Outcome: Not Progressing   Problem: Skin Integrity: Goal: Risk for impaired skin integrity will be minimized. Outcome: Progressing

## 2018-09-02 NOTE — Progress Notes (Signed)
eLink Physician-Brief Progress Note Patient Name: Andre [name] DOB: [birth year] MRN: [id number]   Date of Service  09/02/2018  HPI/Events of Note  Multiple issues: 1. K+ = 3.5 and Creatinine = 1.47 and 2. Troponin = 0.04 --> 0.58. Can't B-Block d/t norepinephrine requirement.   eICU Interventions  Will order: 1. Replace K+ 2. 12 Lead EKG now.  3. Continue to trend Troponin.  4. Continue ASA Suppository 150 mg PR now and Q day.      Intervention Category Major Interventions: Electrolyte abnormality - evaluation and management Intermediate Interventions: Diagnostic test evaluation  Sommer,Steven Eugene 09/02/2018, 5:44 AM

## 2018-09-03 ENCOUNTER — Inpatient Hospital Stay (HOSPITAL_COMMUNITY): Payer: Medicare Other

## 2018-09-03 DIAGNOSIS — R570 Cardiogenic shock: Secondary | ICD-10-CM

## 2018-09-03 DIAGNOSIS — N179 Acute kidney failure, unspecified: Secondary | ICD-10-CM

## 2018-09-03 LAB — BASIC METABOLIC PANEL
ANION GAP: 8 (ref 5–15)
Anion gap: 12 (ref 5–15)
Anion gap: 13 (ref 5–15)
Anion gap: 11 (ref 5–15)
Anion gap: 9 (ref 5–15)
BUN: 31 mg/dL — ABNORMAL HIGH (ref 6–20)
BUN: 32 mg/dL — ABNORMAL HIGH (ref 6–20)
BUN: 32 mg/dL — ABNORMAL HIGH (ref 6–20)
BUN: 33 mg/dL — ABNORMAL HIGH (ref 6–20)
BUN: 35 mg/dL — ABNORMAL HIGH (ref 6–20)
CHLORIDE: 90 mmol/L — AB (ref 98–111)
CO2: 37 mmol/L — ABNORMAL HIGH (ref 22–32)
CO2: 36 mmol/L — ABNORMAL HIGH (ref 22–32)
CO2: 39 mmol/L — ABNORMAL HIGH (ref 22–32)
CO2: 42 mmol/L — ABNORMAL HIGH (ref 22–32)
CO2: 41 mmol/L — ABNORMAL HIGH (ref 22–32)
Calcium: 8.5 mg/dL — ABNORMAL LOW (ref 8.9–10.3)
Calcium: 8.4 mg/dL — ABNORMAL LOW (ref 8.9–10.3)
Calcium: 8.4 mg/dL — ABNORMAL LOW (ref 8.9–10.3)
Calcium: 8.4 mg/dL — ABNORMAL LOW (ref 8.9–10.3)
Calcium: 8.2 mg/dL — ABNORMAL LOW (ref 8.9–10.3)
Chloride: 89 mmol/L — ABNORMAL LOW (ref 98–111)
Chloride: 89 mmol/L — ABNORMAL LOW (ref 98–111)
Chloride: 89 mmol/L — ABNORMAL LOW (ref 98–111)
Chloride: 89 mmol/L — ABNORMAL LOW (ref 98–111)
Creatinine, Ser: 2.33 mg/dL — ABNORMAL HIGH (ref 0.61–1.24)
Creatinine, Ser: 2.5 mg/dL — ABNORMAL HIGH (ref 0.61–1.24)
Creatinine, Ser: 2.68 mg/dL — ABNORMAL HIGH (ref 0.61–1.24)
Creatinine, Ser: 2.82 mg/dL — ABNORMAL HIGH (ref 0.61–1.24)
Creatinine, Ser: 3.19 mg/dL — ABNORMAL HIGH (ref 0.61–1.24)
GFR calc Af Amer: 40 mL/min — ABNORMAL LOW (ref >60)
GFR calc Af Amer: 33 mL/min — ABNORMAL LOW (ref >60)
GFR calc Af Amer: 31 mL/min — ABNORMAL LOW (ref >60)
GFR calc Af Amer: 27 mL/min — ABNORMAL LOW (ref >60)
GFR calc non Af Amer: 34 mL/min — ABNORMAL LOW (ref >60)
GFR calc non Af Amer: 31 mL/min — ABNORMAL LOW (ref >60)
GFR calc non Af Amer: 27 mL/min — ABNORMAL LOW (ref >60)
GFR calc non Af Amer: 23 mL/min — ABNORMAL LOW (ref >60)
GFR, EST AFRICAN AMERICAN: 36 mL/min — AB (ref >60)
GFR, EST NON AFRICAN AMERICAN: 29 mL/min — AB (ref >60)
GLUCOSE: 100 mg/dL — AB (ref 70–99)
Glucose, Bld: 108 mg/dL — ABNORMAL HIGH (ref 70–99)
Glucose, Bld: 102 mg/dL — ABNORMAL HIGH (ref 70–99)
Glucose, Bld: 96 mg/dL (ref 70–99)
Glucose, Bld: 92 mg/dL (ref 70–99)
POTASSIUM: 4.1 mmol/L (ref 3.5–5.1)
POTASSIUM: 5 mmol/L (ref 3.5–5.1)
Potassium: 4.4 mmol/L (ref 3.5–5.1)
Potassium: 4.9 mmol/L (ref 3.5–5.1)
Potassium: 5.2 mmol/L — ABNORMAL HIGH (ref 3.5–5.1)
SODIUM: 138 mmol/L (ref 135–145)
SODIUM: 138 mmol/L (ref 135–145)
Sodium: 139 mmol/L (ref 135–145)
Sodium: 140 mmol/L (ref 135–145)
Sodium: 139 mmol/L (ref 135–145)

## 2018-09-03 LAB — BLOOD GAS, ARTERIAL
Acid-Base Excess: 13.7 mmol/L — ABNORMAL HIGH (ref 0.0–2.0)
Acid-Base Excess: 14 mmol/L — ABNORMAL HIGH (ref 0.0–2.0)
Bicarbonate: 41.9 mmol/L — ABNORMAL HIGH (ref 20.0–28.0)
Bicarbonate: 41.2 mmol/L — ABNORMAL HIGH (ref 20.0–28.0)
Drawn by: 331761
FIO2: 100
FIO2: 100
LHR: 18 {breaths}/min
MECHVT: 440 mL
O2 Saturation: 92.5 %
O2 Saturation: 91.8 %
PEEP/CPAP: 14 cmH2O
PEEP: 14 cmH2O
PO2 ART: 68.8 mmHg — AB (ref 83.0–108.0)
Patient temperature: 98.6
Patient temperature: 98.6
Pressure control: 30 cmH2O
RATE: 26 resp/min
pCO2 arterial: 111 mmHg (ref 32.0–48.0)
pCO2 arterial: 91.6 mmHg (ref 32.0–48.0)
pH, Arterial: 7.202 — ABNORMAL LOW (ref 7.350–7.450)
pH, Arterial: 7.275 — ABNORMAL LOW (ref 7.350–7.450)
pO2, Arterial: 78.1 mmHg — ABNORMAL LOW (ref 83.0–108.0)

## 2018-09-03 LAB — POCT I-STAT 4, (NA,K, GLUC, HGB,HCT)
GLUCOSE: 105 mg/dL — AB (ref 70–99)
Glucose, Bld: 115 mg/dL — ABNORMAL HIGH (ref 70–99)
Glucose, Bld: 109 mg/dL — ABNORMAL HIGH (ref 70–99)
Glucose, Bld: 100 mg/dL — ABNORMAL HIGH (ref 70–99)
HCT: 43 % (ref 39.0–52.0)
HCT: 44 % (ref 39.0–52.0)
HCT: 44 % (ref 39.0–52.0)
HEMATOCRIT: 44 % (ref 39.0–52.0)
Hemoglobin: 15 g/dL (ref 13.0–17.0)
Hemoglobin: 14.6 g/dL (ref 13.0–17.0)
Hemoglobin: 15 g/dL (ref 13.0–17.0)
Hemoglobin: 15 g/dL (ref 13.0–17.0)
Potassium: 4 mmol/L (ref 3.5–5.1)
Potassium: 4.1 mmol/L (ref 3.5–5.1)
Potassium: 4.2 mmol/L (ref 3.5–5.1)
Potassium: 4.3 mmol/L (ref 3.5–5.1)
Sodium: 138 mmol/L (ref 135–145)
Sodium: 138 mmol/L (ref 135–145)
Sodium: 138 mmol/L (ref 135–145)
Sodium: 138 mmol/L (ref 135–145)

## 2018-09-03 LAB — TROPONIN I: Troponin I: 0.25 ng/mL (ref <0.03)

## 2018-09-03 LAB — POCT I-STAT 7, (LYTES, BLD GAS, ICA,H+H)
Acid-Base Excess: 13 mmol/L — ABNORMAL HIGH (ref 0.0–2.0)
Acid-Base Excess: 13 mmol/L — ABNORMAL HIGH (ref 0.0–2.0)
Bicarbonate: 45 mmol/L — ABNORMAL HIGH (ref 20.0–28.0)
Bicarbonate: 44.2 mmol/L — ABNORMAL HIGH (ref 20.0–28.0)
Calcium, Ion: 1.12 mmol/L — ABNORMAL LOW (ref 1.15–1.40)
Calcium, Ion: 1.11 mmol/L — ABNORMAL LOW (ref 1.15–1.40)
HCT: 42 % (ref 39.0–52.0)
HCT: 42 % (ref 39.0–52.0)
Hemoglobin: 14.3 g/dL (ref 13.0–17.0)
Hemoglobin: 14.3 g/dL (ref 13.0–17.0)
O2 SAT: 89 %
O2 Saturation: 90 %
PH ART: 7.286 — AB (ref 7.350–7.450)
POTASSIUM: 4.8 mmol/L (ref 3.5–5.1)
Patient temperature: 35.8
Potassium: 4.7 mmol/L (ref 3.5–5.1)
Sodium: 139 mmol/L (ref 135–145)
Sodium: 138 mmol/L (ref 135–145)
TCO2: 48 mmol/L — ABNORMAL HIGH (ref 22–32)
TCO2: 47 mmol/L — ABNORMAL HIGH (ref 22–32)
pCO2 arterial: 91.2 mmHg (ref 32.0–48.0)
pCO2 arterial: 91.9 mmHg (ref 32.0–48.0)
pH, Arterial: 7.296 — ABNORMAL LOW (ref 7.350–7.450)
pO2, Arterial: 64 mmHg — ABNORMAL LOW (ref 83.0–108.0)
pO2, Arterial: 67 mmHg — ABNORMAL LOW (ref 83.0–108.0)

## 2018-09-03 LAB — CBC
HCT: 45.9 % (ref 39.0–52.0)
Hemoglobin: 13 g/dL (ref 13.0–17.0)
MCH: 25.9 pg — ABNORMAL LOW (ref 26.0–34.0)
MCHC: 28.3 g/dL — ABNORMAL LOW (ref 30.0–36.0)
MCV: 91.6 fL (ref 80.0–100.0)
Platelets: 132 10*3/uL — ABNORMAL LOW (ref 150–400)
RBC: 5.01 MIL/uL (ref 4.22–5.81)
RDW: 21.2 % — ABNORMAL HIGH (ref 11.5–15.5)
WBC: 8.7 10*3/uL (ref 4.0–10.5)
nRBC: 0 % (ref 0.0–0.2)

## 2018-09-03 LAB — GLUCOSE, CAPILLARY
GLUCOSE-CAPILLARY: 94 mg/dL (ref 70–99)
Glucose-Capillary: 94 mg/dL (ref 70–99)
Glucose-Capillary: 89 mg/dL (ref 70–99)
Glucose-Capillary: 89 mg/dL (ref 70–99)
Glucose-Capillary: 95 mg/dL (ref 70–99)

## 2018-09-03 LAB — PHOSPHORUS
PHOSPHORUS: 5.4 mg/dL — AB (ref 2.5–4.6)
Phosphorus: 7.5 mg/dL — ABNORMAL HIGH (ref 2.5–4.6)

## 2018-09-03 LAB — MAGNESIUM
Magnesium: 2 mg/dL (ref 1.7–2.4)
Magnesium: 2 mg/dL (ref 1.7–2.4)

## 2018-09-03 LAB — HEPARIN LEVEL (UNFRACTIONATED)
Heparin Unfractionated: 0.19 IU/mL — ABNORMAL LOW (ref 0.30–0.70)
Heparin Unfractionated: 0.15 IU/mL — ABNORMAL LOW (ref 0.30–0.70)
Heparin Unfractionated: <0.1 IU/mL — ABNORMAL LOW (ref 0.30–0.70)

## 2018-09-03 MED ORDER — NOREPINEPHRINE 16 MG/250ML-% IV SOLN
0.0000 ug/min | INTRAVENOUS | Status: DC
  Administered 2018-09-03: 25 ug/min via INTRAVENOUS
  Administered 2018-09-04: 26 ug/min via INTRAVENOUS
  Administered 2018-09-04: 32 ug/min via INTRAVENOUS
  Administered 2018-09-05: 15 ug/min via INTRAVENOUS
  Administered 2018-09-05: 25 ug/min via INTRAVENOUS
  Administered 2018-09-06: 13 ug/min via INTRAVENOUS
  Administered 2018-09-07: 14 ug/min via INTRAVENOUS
  Administered 2018-09-07: 38 ug/min via INTRAVENOUS
  Administered 2018-09-08: 47 ug/min via INTRAVENOUS
  Filled 2018-09-03 (×9): qty 250

## 2018-09-03 MED ORDER — VASOPRESSIN 20 UNIT/ML IV SOLN
0.0400 [IU]/min | INTRAVENOUS | Status: DC
  Administered 2018-09-03 – 2018-09-07 (×6): 0.04 [IU]/min via INTRAVENOUS
  Filled 2018-09-03 (×9): qty 2

## 2018-09-03 MED ORDER — PRISMASOL BGK 4/2.5 32-4-2.5 MEQ/L REPLACEMENT SOLN
Status: DC
  Administered 2018-09-03 – 2018-09-07 (×14): via INTRAVENOUS_CENTRAL
  Filled 2018-09-03 (×20): qty 5000

## 2018-09-03 MED ORDER — PRISMASOL BGK 4/2.5 32-4-2.5 MEQ/L REPLACEMENT SOLN
Status: DC
  Administered 2018-09-03 – 2018-09-06 (×8): via INTRAVENOUS_CENTRAL
  Filled 2018-09-03 (×13): qty 5000

## 2018-09-03 MED ORDER — IPRATROPIUM-ALBUTEROL 0.5-2.5 (3) MG/3ML IN SOLN
3.0000 mL | RESPIRATORY_TRACT | Status: DC
  Administered 2018-09-03 – 2018-09-08 (×30): 3 mL via RESPIRATORY_TRACT
  Filled 2018-09-03 (×30): qty 3

## 2018-09-03 MED ORDER — PRISMASOL BGK 4/2.5 32-4-2.5 MEQ/L IV SOLN
INTRAVENOUS | Status: DC
  Administered 2018-09-03 – 2018-09-08 (×32): via INTRAVENOUS_CENTRAL
  Filled 2018-09-03 (×57): qty 5000

## 2018-09-03 MED ORDER — HEPARIN SODIUM (PORCINE) 1000 UNIT/ML DIALYSIS
1000.0000 [IU] | INTRAMUSCULAR | Status: DC | PRN
  Administered 2018-09-04: 2800 [IU] via INTRAVENOUS_CENTRAL
  Administered 2018-09-06 – 2018-09-07 (×2): 3000 [IU] via INTRAVENOUS_CENTRAL
  Filled 2018-09-03: qty 6
  Filled 2018-09-03: qty 5
  Filled 2018-09-03: qty 6
  Filled 2018-09-03: qty 2
  Filled 2018-09-03: qty 6
  Filled 2018-09-03 (×2): qty 3
  Filled 2018-09-03: qty 6

## 2018-09-03 MED ORDER — FUROSEMIDE 10 MG/ML IJ SOLN
120.0000 mg | Freq: Once | INTRAVENOUS | Status: AC
  Administered 2018-09-03: 120 mg via INTRAVENOUS
  Filled 2018-09-03: qty 12

## 2018-09-03 NOTE — Progress Notes (Signed)
NAME:  Andre [name], MRN:  [id number], DOB:  29-Jan-1981, LOS: 6 ADMISSION DATE:  2018/09/17, CONSULTATION DATE: 08/31/2018 REFERRING MD: Ashley Royalty, CHIEF COMPLAINT: Hypoxemic respiratory failure  Brief History   38 y/o M admitted with shortness of breath.  Currently being treated for hypercapnic respiratory failure, obesity hypoventilation.  History of respiratory failure, obstructive sleep apnea for which he is on CPAP at home Recent treatment for cellulitis.  Past Medical History  Iron deficient anemia, chronic lymphedema, prior cellulitis, heart failure, hypertension, obesity hypoventilation syndrome, obstructive sleep apnea.  Significant Hospital Events   2/13  Admit with SOB, NIPPV 2/17  PCCM consulted for hypercarbic resp failure 2/18  Remains hypercarbic, bicarb >50, diamox added  2/18: PEA arrest, did have VT/VF episodes during this event.  Etiology unclear initially, presumptively hypoxia related.  Was difficult intubation by anesthesia.  Time to return of spontaneous circulation recorded at 20 minutes.  Received several rounds of epinephrine, 4-5 defibrillation, 2 rounds of bicarbonate, and magnesium sulfate.  Post intubation chest x-ray worrisome for peritracheal hematoma.  CT imaging ordered to rule out cerebral bleed, as well as further CT chest imaging to rule out pulmonary emboli as well as paratracheal hematoma.  Consults:  PCCM 2/17   Procedures:  OETT 2/ 18 (DIFFICULT AIRWAY per anesthesia) >> Right IJ CVL 2/18 >>  Significant Diagnostic Tests:  ECHO 2/15 >> LVEF > 65%, mildly dilated LV, RV pressure could not be assessed, LA severely dilated CT head 2/18 >> negative  CT chest 2/18 >> non-diagnostic for PE, small L, moderate R pleural effusions with dependent consolidation, cardiomegaly, no evidence of mediastinal hematoma EEG 2/19 >>  ECHO 2/19 >>  Micro Data:  MRSA PCR 2/14 >>  BCx2 2/14 >> negative   Antimicrobials:  Diflucan 2/16 >> Unasyn 2/18 >> 2/19   Ceftriaxone 2/19 >>   Interim history/subjective:  Urology placed foley overnight. Remains oliguric. Family at bedside updated on critical condition. Remains on high vent settings without minimal improvement in oxygenation  Objective   Blood pressure 103/65, pulse 93, temperature 98.6 F (37 C), resp. rate (!) 28, height 5\' 6"  (1.676 m), weight (!) 184.7 kg, SpO2 92 %. CVP:  [15 mmHg-18 mmHg] 18 mmHg  Vent Mode: PCV FiO2 (%):  [80 %-100 %] 100 % Set Rate:  [18 bmp-26 bmp] 26 bmp Vt Set:  [440 mL] 440 mL PEEP:  [14 cmH20-16 cmH20] 14 cmH20 Plateau Pressure:  [32 cmH20-38 cmH20] 38 cmH20   Intake/Output Summary (Last 24 hours) at 09/03/2018 2011 Last data filed at 09/03/2018 1800 Gross per 24 hour  Intake 4579.3 ml  Output 326 ml  Net 4253.3 ml   Filed Weights   09/01/18 1700 09/02/18 0500 09/03/18 0500  Weight: (!) 217 kg (!) 220.9 kg (!) 184.7 kg    Examination: General:  MO male, laying in bed on vent HENT: NCAT ETT in place PULM: Bilaterally wheezing CV: RRR, no mgr GI: BS+, soft, nontender MSK: normal bulk and tone Neuro: sedated on vent Extremities: Bilateral lower extremity edema  Resolved Hospital Problem list     Assessment & Plan:   Acute on Chronic Hypercarbic Respiratory Failure initially 2/2 decompensated OSA / OHS further  c/b cardiopulmonary arrest. -DIFFICULT AIRWAY -Acutely hypoxic and cyanotic.  Etiology unclear.  Question cardiac event versus DVT although had been on Lovenox also possible aspiration.  Just prior to event was fully awake and oriented so progressive hypercarbia seems unlikely.  CTA chest unable to rule out PE Tobacco Abuse  P: Full vent support Titration of  PEEP / FiO2 for sats >90% ABG as needed Aspiration precautions / VAP prevention measures  May need early tracheostomy if recovers neurologically Continue empiric antibiotics Will obtain trach aspirate Adjust sedation for RASS Goal of -5 / vent synchrony  Duoneb Q6    Widening Peritracheal / Right Mediastinal Mass, ruled out -Hematoma ruled out on CT 2/18 P: Follow intermittent CXR   Cardiac Arrest -initially PEA, did have episodes of VT/VF.  R/O PE Cardiogenic shock  Diastolic heart failure -Presumptively this was hypoxia.  However acute coronary event or thromboembolic event seem plausible.  There is also question of aspiration event.  Hypercarbia seems unlikely as he was fully awake and had been tolerating PCO2 in the 90s previously P: Cardiology following  Empiric heparin gtt for possible PE Hold home antihypertensives  Trend CVP Levophed for MAP >85 Add vasopressin  Acute Encephalopathy -concern for hypoxic encephalopathy s/p prolonged time to ROSC  -CT Head negative P: Await rewarming for neuro exam  EEG pending  Hypokalemia  P: KCL replacement 2/19  Contraction Alkalosis P: Follow ABG closely, at risk for overventilation   At risk for AKI s/p cardiac arrest P: Trend BMP / urinary output Replace electrolytes as indicated Avoid nephrotoxic agents, ensure adequate renal perfusion Nephrology consulted. Will plan to dialysis due to worsening respiratory status  Urinary Incontinence -Urology was unable to place Foley catheter due to body habitus P: Place pouch over opening, attach to bedside bag  Chronic Lymphedema  P: Hold diuresis for now    Best practice:  Diet: NPO Pain/Anxiety/Delirium protocol (if indicated): per hypothermia protocol VAP protocol (if indicated): ordered DVT prophylaxis: Heparin gtt GI prophylaxis: PPI Glucose control: n/a Mobility: Bedrest Code Status: Full code Family Communication:Mother updated at bedside Disposition:  ICU   The patient is critically ill with multiple organ systems failure and requires high complexity decision making for assessment and support, frequent evaluation and titration of therapies, application of advanced monitoring technologies and extensive interpretation of  multiple databases.   Critical Care Time devoted to patient care services described in this note is 50 Minutes. This time reflects time of care of this signee Dr. Mechele Collin. This critical care time does not reflect procedure time, or teaching time or supervisory time of PA/NP/Med student/Med Resident etc but could involve care discussion time.  Mechele Collin, M.D. [facility] Pulmonary/Critical Care Medicine Pager: [phone] After hours pager: [phone]

## 2018-09-03 NOTE — Procedures (Signed)
Central Venous Catheter Insertion Procedure Note Andre [name] [id number] 1981-03-16  Procedure: Insertion of Central Venous Catheter Indications: hemodialysis   Procedure Details Consent: Risks of procedure as well as the alternatives and risks of each were explained to the (patient/caregiver).  Consent for procedure obtained. Time Out: Verified patient identification, verified procedure, site/side was marked, verified correct patient position, special equipment/implants available, medications/allergies/relevent history reviewed, required imaging and test results available.  Performed  Heparin gtt off since 1815.  Maximum sterile technique was used including antiseptics, cap, gloves, gown, hand hygiene, mask and sheet. Skin prep: Chlorhexidine; local anesthetic administered 4 ml Lido 1% A triple lumen trialysis catheter was placed in the left internal jugular vein using the Seldinger technique to 20 cm.  Line sutured.  Biopatch and sterile dressing applied.   Evaluation Blood flow good Complications: No apparent complications Patient did tolerate procedure well. Chest X-ray ordered to verify placement.  CXR: pending.  Will resume IV heparin gtt after CXR confirmation.  Procedure performed with ultrasound guidance for real time vessel cannulation.     Posey Boyer, MSN, AGACNP-BC [facility] Pgr: [phone] or if no answer [phone] 09/03/2018, 8:48 PM

## 2018-09-03 NOTE — Progress Notes (Signed)
ANTICOAGULATION CONSULT NOTE  Pharmacy Consult for heparin  Indication: Rule-out PE  Allergies  Allergen Reactions  . Other Hives    Steroid, but cannot remember name.    Patient Measurements: Height: 5\' 6"  (167.6 cm) Weight: (!) 487 lb 0.3 oz (220.9 kg) IBW/kg (Calculated) : 63.8 Heparin Dosing Weight: 122 kg   Vital Signs: Temp: 94.8 F (34.9 C) (02/20 0400) Temp Source: Esophageal (02/20 0400) BP: 117/56 (02/20 0400) Pulse Rate: 90 (02/20 0335)  Labs: Recent Labs    09/01/18 0532  09/01/18 1304  09/02/18 0105  09/02/18 0430  09/02/18 1103  09/02/18 1522 09/02/18 1700 09/02/18 1852 09/02/18 2048  09/02/18 2307 09/03/18 0111 09/03/18 0337 09/03/18 0338 09/03/18 0343  HGB 12.9*  --   --    < >  --    < > 12.6*   < >  --    < >  --   --   --   --    < >  --  15.0 13.0 14.6  --   HCT 46.4  --   --    < >  --    < > 43.6   < >  --    < >  --   --   --   --    < >  --  44.0 45.9 43.0  --   PLT 152  --   --   --   --   --  144*  --   --   --   --   --   --   --   --   --   --  132*  --   --   APTT  --   --  30  --   --   --   --   --   --   --   --   --   --   --   --   --   --   --   --   --   LABPROT  --   --  15.4*  --  15.6*  --   --   --   --   --   --   --   --   --   --   --   --   --   --   --   INR  --   --  1.24  --  1.25  --   --   --   --   --   --   --   --   --   --   --   --   --   --   --   HEPARINUNFRC  --   --   --   --   --   --   --   --   --   --   --   --  0.32 0.32  --   --   --   --   --  0.19*  CREATININE 0.48*  --  0.81   < >  --   --  1.47*  --  1.70*  --   --  1.83*  --   --   --   --   --   --   --   --   TROPONINI  --    < > 0.04*  --   --   --  0.58*   < >  0.39*  --  0.34*  --   --   --   --  0.25*  --   --   --   --    < > = values in this interval not displayed.    Estimated Creatinine Clearance: 98 mL/min (A) (by C-G formula based on SCr of 1.83 mg/dL (H)).   Assessment: 27 yoM s/p code blue with PEA arrest initially,ventricular  fibrillation and at one point this looks like torsades.   CT neg for mediastinal hematoma, head CT neg for bleed. CT chest was completed that was non-diagnostic of PE (insufficient contrast). Currently undergoing TTM for cardiac arrest - pt started rewarming ~2100 2/19.  Heparin level down to 0.19 (subtherapeutic) on gtt at 1000 units/hr - likely due to rewarming so clearing faster. Hgb stable, plt down to 132. No issues with line or bleeding reported per RN.  Goals: Heparin level: 0.3-0.7 units/ml Monitor pltc    Plan:  Increase heparin infusion to 1400 units/hr F/u 6 hr heparin level  Christoper Fabian, PharmD, BCPS Clinical pharmacist  **Pharmacist phone directory can now be found on [email or web address] ([email or web address]).  Listed under [facility]. 09/03/2018 4:53 AM

## 2018-09-03 NOTE — Progress Notes (Addendum)
The patient has been seen in conjunction with Levora Dredge, MD. All aspects of care have been considered and discussed. The patient has been personally interviewed, examined, and all clinical data has been reviewed.  [facility] will sign off.   Medication Recommendations: None Other recommendations (labs, testing, etc): None  Follow up as an outpatient: PRN   Progress Note  Patient Name: Andre [name] Date of Encounter: 09/03/2018  Primary Cardiologist: No primary care provider on file.   Subjective    Patient intubated, sedated, and on hypothermia protocol. Mother and family at bedside. Discussed results of echocardiogram. Family very appreciative of care.   Inpatient Medications    Scheduled Meds: . artificial tears  1 application Both Eyes Q8H  . aspirin  150 mg Rectal Daily  . budesonide (PULMICORT) nebulizer solution  0.5 mg Nebulization BID  . chlorhexidine  15 mL Mouth Rinse BID  . feeding supplement (PRO-STAT SUGAR FREE 64)  30 mL Per Tube BID  . feeding supplement (VITAL HIGH PROTEIN)  1,000 mL Per Tube Q24H  . fentaNYL (SUBLIMAZE) injection  100 mcg Intravenous Once  . fluconazole  200 mg Oral Daily  . ipratropium-albuterol  3 mL Nebulization Q4H  . mouth rinse  15 mL Mouth Rinse q12n4p  . mouth rinse  15 mL Mouth Rinse 10 times per day  . nystatin cream   Topical BID  . pantoprazole (PROTONIX) IV  40 mg Intravenous QHS  . potassium chloride  20 mEq Oral BID  . sodium chloride flush  3 mL Intravenous Q12H   Continuous Infusions: . sodium chloride Stopped (09/01/18 0757)  . cefTRIAXone (ROCEPHIN)  IV Stopped (09/02/18 1304)  . cisatracurium (NIMBEX) infusion 10 mcg/kg/min (09/03/18 0700)  . fentaNYL infusion INTRAVENOUS 400 mcg/hr (09/03/18 0700)  . heparin 1,400 Units/hr (09/03/18 0700)  . midazolam (VERSED) 50mg  in NS 50mL (1mg /ml) premix infusion 9.5 mg/hr (09/03/18 0700)  . norepinephrine (LEVOPHED) Adult infusion 8 mcg/min (09/03/18 0100)    PRN Meds: sodium chloride, albuterol, fentaNYL, midazolam   Vital Signs    Vitals:   09/03/18 0400 09/03/18 0500 09/03/18 0600 09/03/18 0700  BP: (!) 117/56 (!) 109/55 (!) 116/59   Pulse:      Resp: 17 18 18 17   Temp: (!) 94.8 F (34.9 C) (!) 95.4 F (35.2 C) (!) 95.4 F (35.2 C)   TempSrc: Esophageal     SpO2: 92% 91% 93% 97%  Weight:  (!) 184.7 kg    Height:        Intake/Output Summary (Last 24 hours) at 09/03/2018 0743 Last data filed at 09/03/2018 0700 Gross per 24 hour  Intake 5608.27 ml  Output 530 ml  Net 5078.27 ml   Filed Weights   09/01/18 1700 09/02/18 0500 09/03/18 0500  Weight: (!) 217 kg (!) 220.9 kg (!) 184.7 kg   Telemetry    NSR - Personally Reviewed  ECG    NSR with normal axis. QT prolongation (520 msec by calculation) - Personally Reviewed  Physical Exam   Today's Vitals   09/03/18 0400 09/03/18 0500 09/03/18 0600 09/03/18 0700  BP: (!) 117/56 (!) 109/55 (!) 116/59   Pulse:      Resp: 17 18 18 17   Temp: (!) 94.8 F (34.9 C) (!) 95.4 F (35.2 C) (!) 95.4 F (35.2 C)   TempSrc: Esophageal     SpO2: 92% 91% 93% 97%  Weight:  (!) 184.7 kg    Height:      PainSc:  Body mass index is 65.72 kg/m.  GEN: Morbidly obese, sedated   Neck: Thick neck, unable to appreciate JVD Cardiac: RRR, no murmurs, rubs, or gallops.  Respiratory: Clear to auscultation bilaterally. GI: Soft, nontender, non-distended  MS: Moderate pitting edema; No deformity. Neuro:  Heavily sedated on hypothermia protocol Psych: Unable to assess  Labs    Chemistry Recent Labs  Lab 09-15-18 2229  08/30/18 0857  09/02/18 1103  09/02/18 1700  09/03/18 0337 09/03/18 0338 09/03/18 0538 09/03/18 0701  NA 138   < > 139   < > 138   < > 139   < > 138 138 138 138  K 3.5   < > 4.4   < > 3.7   < > 3.7   < > 4.1 4.1 4.2 4.3  CL 94*   < > 88*   < > 87*  --  88*  --  89*  --   --   --   CO2 36*   < > 47*   < > 39*  --  39*  --  37*  --   --   --   GLUCOSE 110*   <  > 97   < > 74  --  82   < > 108* 109* 105* 100*  BUN 14   < > 13   < > 25*  --  27*  --  31*  --   --   --   CREATININE 0.76   < > 0.49*   < > 1.70*  --  1.83*  --  2.33*  --   --   --   CALCIUM 8.2*   < > 8.4*   < > 8.6*  --  8.6*  --  8.5*  --   --   --   PROT 7.6  --  7.7  --   --   --   --   --   --   --   --   --   ALBUMIN 2.7*  --  2.9*  --   --   --   --   --   --   --   --   --   AST 17  --  14*  --   --   --   --   --   --   --   --   --   ALT 17  --  13  --   --   --   --   --   --   --   --   --   ALKPHOS 97  --  111  --   --   --   --   --   --   --   --   --   BILITOT 1.2  --  1.0  --   --   --   --   --   --   --   --   --   GFRNONAA >60   < > >60   < > 50*  --  46*  --  34*  --   --   --   GFRAA >60   < > >60   < > 58*  --  53*  --  40*  --   --   --   ANIONGAP 8   < > 4*   < > 12  --  12  --  12  --   --   --    < > =  values in this interval not displayed.    Hematology Recent Labs  Lab 09/01/18 0532  09/02/18 0430  09/03/18 4098 09/03/18 0338 09/03/18 0538 09/03/18 0701  WBC 8.7  --  7.3  --  8.7  --   --   --   RBC 4.91  --  4.84  --  5.01  --   --   --   HGB 12.9*   < > 12.6*   < > 13.0 14.6 15.0 15.0  HCT 46.4   < > 43.6   < > 45.9 43.0 44.0 44.0  MCV 94.5  --  90.1  --  91.6  --   --   --   MCH 26.3  --  26.0  --  25.9*  --   --   --   MCHC 27.8*  --  28.9*  --  28.3*  --   --   --   RDW 20.0*  --  20.0*  --  21.2*  --   --   --   PLT 152  --  144*  --  132*  --   --   --    < > = values in this interval not displayed.   Cardiac Enzymes Recent Labs  Lab 09/02/18 0647 09/02/18 1103 09/02/18 1522 09/02/18 2307  TROPONINI 0.48* 0.39* 0.34* 0.25*   No results for input(s): TROPIPOC in the last 168 hours.   BNP Recent Labs  Lab 08/21/2018 2229  BNP 235.6*    DDimer No results for input(s): DDIMER in the last 168 hours.   Radiology    Dg Chest 1 View  Result Date: 09/01/2018 CLINICAL DATA:  Difficult intubation. EXAM: CHEST  1 VIEW COMPARISON:   Radiographs of August 31, 2018. FINDINGS: Stable cardiomegaly with central pulmonary vascular congestion and possible pulmonary edema. Nasogastric tube is seen entering the stomach. The endotracheal tube is seen projected over tracheal air shadow, but it is difficult to evaluate its distal tip due to body habitus. It appears to be at least several cm above the carina. Right internal jugular catheter is noted with tip in expected position of right innominate vein. There is widening of the right paratracheal region which may represent mass or possibly hematoma. No pneumothorax or effusion is noted. Visualized bony thorax is unremarkable. IMPRESSION: Widening of the right paratracheal region is noted concerning for possible mediastinal mass or hematoma. CT scan of the chest with intravenous contrast is recommended for further evaluation. Nasogastric tube appears to be in grossly good position. Endotracheal tube is projected over tracheal air shadow, but distal tip is not well visualized due to body habitus, but it appears to be at least several cm above the carina. Repeat radiograph is recommended. These results will be called to the ordering clinician or representative by the Radiologist Assistant, and communication documented in the PACS or zVision Dashboard. Stable cardiomegaly is noted with central pulmonary vascular congestion and probable bilateral pulmonary edema. Electronically Signed   By: Lupita Raider, M.D.   On: 09/01/2018 13:56   Ct Head Wo Contrast  Result Date: 09/01/2018 CLINICAL DATA:  38 y/o M; episode of pulseless arrest post 20 minutes CPR and defibrillation. Persistent unresponsiveness. EXAM: CT HEAD WITHOUT CONTRAST TECHNIQUE: Contiguous axial images were obtained from the base of the skull through the vertex without intravenous contrast. COMPARISON:  None. FINDINGS: Brain: No evidence of acute infarction, hemorrhage, hydrocephalus, extra-axial collection or mass lesion/mass effect.  Vascular: No hyperdense vessel or unexpected calcification.  Skull: Normal. Negative for fracture or focal lesion. Sinuses/Orbits: Ethmoid air cell mucosal thickening. Additional visible paranasal sinuses and the mastoid air cells are normally aerated. Orbits are unremarkable. Other: None. IMPRESSION: No acute intracranial abnormality identified. Unremarkable CT of the head. Electronically Signed   By: Mitzi Hansen M.D.   On: 09/01/2018 14:55   Ct Angio Chest Pe W Or Wo Contrast  Result Date: 09/01/2018 CLINICAL DATA:  Post resuscitation, cardiac arrest, concern for mediastinal hematoma EXAM: CT ANGIOGRAPHY CHEST WITH CONTRAST TECHNIQUE: Multidetector CT imaging of the chest was performed using the standard protocol during bolus administration of intravenous contrast. Multiplanar CT image reconstructions and MIPs were obtained to evaluate the vascular anatomy. CONTRAST:  ISOVUE-370 IOPAMIDOL (ISOVUE-370) INJECTION 76% COMPARISON:  Chest radiograph, 09/01/2018 FINDINGS: Examination is generally somewhat limited by extreme body habitus. Cardiovascular: Contrast bolus is insubstantial, main pulmonary artery 75 HU, with minimal contrast observed in the dependent veins of the left upper extremity. Cardiomegaly. No pericardial effusion. Mediastinum/Nodes: No enlarged mediastinal, hilar, or axillary lymph nodes. Thyroid gland, trachea, and esophagus demonstrate no significant findings. Lungs/Pleura: There are small left, moderate right pleural effusions with associated dependent atelectasis or consolidation. There are dense paramedian consolidations of the bilateral upper lobes. Endotracheal tube is positioned with tip above the carina. Upper Abdomen: No acute abnormality. Musculoskeletal: No chest wall abnormality. No acute or significant osseous findings. Review of the MIP images confirms the above findings. IMPRESSION: 1. Contrast bolus is insubstantial, main pulmonary artery 75 HU, with minimal  contrast observed in the dependent veins of the left upper extremity. Examination is nondiagnostic for pulmonary embolism. 2. There are small left, moderate right pleural effusions with associated dependent atelectasis or consolidation. There are dense paramedian consolidations of the bilateral upper lobes. 3.  Cardiomegaly. 4. Endotracheal tube is positioned appropriately with tip above the carina. 5. No evidence of mediastinal hematoma or other resuscitation related trauma. Electronically Signed   By: Lauralyn Primes M.D.   On: 09/01/2018 15:12   Vas [place] Lower Extremity Venous (dvt)  Result Date: 09/02/2018  Lower Venous Study Indications: Swelling.  Limitations: Body habitus and poor ultrasound/tissue interface. Performing Technologist: Chanda Busing RVT  Examination Guidelines: A complete evaluation includes B-mode imaging, spectral Doppler, color Doppler, and power Doppler as needed of all accessible portions of each vessel. Bilateral testing is considered an integral part of a complete examination. Limited examinations for reoccurring indications may be performed as noted.  Right Venous Findings: +---------+---------------+---------+-----------+----------+-------+          CompressibilityPhasicitySpontaneityPropertiesSummary +---------+---------------+---------+-----------+----------+-------+ CFV      Full           Yes      Yes                          +---------+---------------+---------+-----------+----------+-------+ SFJ      Full                                                 +---------+---------------+---------+-----------+----------+-------+ FV Prox  Full                                                 +---------+---------------+---------+-----------+----------+-------+ FV Mid   Full                                                 +---------+---------------+---------+-----------+----------+-------+  FV DistalFull                                                  +---------+---------------+---------+-----------+----------+-------+ PFV      Full                                                 +---------+---------------+---------+-----------+----------+-------+ POP      Full           Yes      Yes                          +---------+---------------+---------+-----------+----------+-------+ PTV      Full                                                 +---------+---------------+---------+-----------+----------+-------+ PERO     Full                                                 +---------+---------------+---------+-----------+----------+-------+  Left Venous Findings: +---------+---------------+---------+-----------+----------+--------------+          CompressibilityPhasicitySpontaneityPropertiesSummary        +---------+---------------+---------+-----------+----------+--------------+ CFV      Full           Yes      Yes                                 +---------+---------------+---------+-----------+----------+--------------+ SFJ      Full                                                        +---------+---------------+---------+-----------+----------+--------------+ FV Prox  Full                                                        +---------+---------------+---------+-----------+----------+--------------+ FV Mid   Full                                                        +---------+---------------+---------+-----------+----------+--------------+ FV DistalFull                                                        +---------+---------------+---------+-----------+----------+--------------+ PFV      Full                                                        +---------+---------------+---------+-----------+----------+--------------+  POP      Full           Yes      Yes                                 +---------+---------------+---------+-----------+----------+--------------+ PTV       Full                                                        +---------+---------------+---------+-----------+----------+--------------+ PERO                                                  Not visualized +---------+---------------+---------+-----------+----------+--------------+    Summary: Right: There is no evidence of deep vein thrombosis in the lower extremity. However, portions of this examination were limited- see technologist comments above. No cystic structure found in the popliteal fossa. Left: There is no evidence of deep vein thrombosis in the lower extremity. However, portions of this examination were limited- see technologist comments above. No cystic structure found in the popliteal fossa.  *See table(s) above for measurements and observations. Electronically signed by Lemar Livings MD on 09/02/2018 at 5:30:41 PM.    Final    Cardiac Studies   TTE 09/02/2018  1. The left ventricle has normal systolic function, with an ejection fraction of 55-60%. The cavity size was normal. There is mildly increased left ventricular wall thickness. Left ventricular diastolic parameters were normal.  2. The right ventricle has normal systolic function. The cavity was normal. There is no increase in right ventricular wall thickness.  3. Left atrial size was mildly dilated.  4. The mitral valve is normal in structure. Mild thickening of the mitral valve leaflet.  5. The tricuspid valve is normal in structure.  6. The aortic valve is tricuspid.  7. The aortic root is normal in size and structure.  TTE 08/29/2018  1. The left ventricle has hyperdynamic systolic function, with an ejection fraction of >65%. The cavity size was mildly dilated. There is mildly increased left ventricular wall thickness. Left ventricular diastolic Doppler parameters are consistent with  pseudonormalization Elevated left ventricular end-diastolic pressure The E/e' is 96.0.  2. The right ventricle has normal systolic  function. The cavity was normal. There is no increase in right ventricular wall thickness. Right ventricular systolic pressure could not be assessed.  3. Left atrial size was severely dilated.  4. The mitral valve is normal in structure.  5. The tricuspid valve is normal in structure.  6. The aortic valve is tricuspid Mild sclerosis of the aortic valve.  7. The pulmonic valve was normal in structure.  8. The inferior vena cava was dilated in size with >50% respiratory variability.  9. Right atrial pressure is estimated at 8 mmHg. 10. The interatrial septum appears to be lipomatous.  Patient Profile     Andre [name] is a 38 y.o. male with a hx of obesity hypoventilation syndrome with chronic hypercapnic respiratory failure who is being seen for the evaluation of PEA cardiac arrest.  Assessment & Plan    PEA Arrest with subsequent V-fib Acute on Chronic Hypercarbic Respiratory Failure -  On IV heparin for possible PE. Echocardiogram obtained yesterday without right heart strain making massive PE unlikely  - PCCM treating for aspiration as well  - EKG unchanged from prior  - Currently on hypothermia protocol with plan to begin rewarming this evening.   QT Prolongation  - Repeat EKG with QTc of ~574msec  - Avoid QT prolonging medications.   Will discuss the case further with Dr. Katrinka Blazing.   For questions or updates, please contact [facility] Please consult [email or web address] for contact info under Cardiology/STEMI.   Signed, Levora Dredge, MD  09/03/2018, 7:43 AM

## 2018-09-03 NOTE — Progress Notes (Signed)
ANTICOAGULATION CONSULT NOTE  Pharmacy Consult for heparin  Indication: Rule-out PE  Allergies  Allergen Reactions  . Other Hives    Steroid, but cannot remember name.    Patient Measurements: Height: 5\' 6"  (167.6 cm) Weight: (!) 407 lb 3 oz (184.7 kg) IBW/kg (Calculated) : 63.8 Heparin Dosing Weight: 122 kg   Vital Signs: Temp: 98.6 F (37 C) (02/20 1900) Temp Source: Esophageal (02/20 1700) BP: 103/65 (02/20 1900) Pulse Rate: 93 (02/20 1914)  Labs: Recent Labs    09/01/18 0532  09/01/18 1304  09/02/18 0105  09/02/18 0430  09/02/18 1103  09/02/18 1522  09/02/18 2307  09/03/18 0337  09/03/18 0343  09/03/18 0701  09/03/18 0930 09/03/18 1102 09/03/18 1129 09/03/18 1318 09/03/18 1320 09/03/18 1704 09/03/18 2050  HGB 12.9*  --   --    < >  --    < > 12.6*   < >  --    < >  --    < >  --    < > 13.0   < >  --    < > 15.0  --  14.3 14.3  --   --   --   --   --   HCT 46.4  --   --    < >  --    < > 43.6   < >  --    < >  --    < >  --    < > 45.9   < >  --    < > 44.0  --  42.0 42.0  --   --   --   --   --   PLT 152  --   --   --   --   --  144*  --   --   --   --   --   --   --  132*  --   --   --   --   --   --   --   --   --   --   --   --   APTT  --   --  30  --   --   --   --   --   --   --   --   --   --   --   --   --   --   --   --   --   --   --   --   --   --   --   --   LABPROT  --   --  15.4*  --  15.6*  --   --   --   --   --   --   --   --   --   --   --   --   --   --   --   --   --   --   --   --   --   --   INR  --   --  1.24  --  1.25  --   --   --   --   --   --   --   --   --   --   --   --   --   --   --   --   --   --   --   --   --   --  HEPARINUNFRC  --   --   --   --   --   --   --   --   --   --   --    < >  --   --   --   --  0.19*  --   --   --   --   --   --  0.15*  --   --  <0.10*  CREATININE 0.48*  --  0.81   < >  --   --  1.47*  --  1.70*  --   --    < >  --   --  2.33*  --   --   --   --    < >  --   --  2.68*  --  2.82* 3.19*  --    TROPONINI  --    < > 0.04*  --   --   --  0.58*   < > 0.39*  --  0.34*  --  0.25*  --   --   --   --   --   --   --   --   --   --   --   --   --   --    < > = values in this interval not displayed.    Estimated Creatinine Clearance: 49.8 mL/min (A) (by C-G formula based on SCr of 3.19 mg/dL (H)).   Assessment: 47 yoM s/p code blue with PEA arrest initially,ventricular fibrillation and at one point this looks like torsades.   CT neg for mediastinal hematoma, head CT neg for bleed. CT chest was completed that was non-diagnostic of PE (insufficient contrast). Currently undergoing TTM for cardiac arrest - pt started rewarming ~2100 2/19.  Heparin level down to 0.15 (subtherapeutic) on gtt at 1400 units/hr - likely due to rewarming so clearing faster. Hgb stable, plt down to 132. No issues with line or bleeding reported per RN.  Of note, medication order did not reflect the 1400 units/hr change from this morning, but RN confirmed that heparin has been running at this rate since around 5 am this morning.   Goals: Heparin level: 0.3-0.7 units/ml Monitor pltc    Plan:  Increase heparin infusion to 1800 units/hr F/u 6 hr heparin level  Danae Orleans, PharmD PGY1 Pharmacy Resident Phone [phone] 09/03/2018       9:45 PM   PM ADDENDUM - PM heparin level drawn and resulted as undetectable. Per discussion with RN, heparin was turned off per CCM for HD cath placement from [phone]. No bleeding issues reported. Will re-enter heparin level for 6 hours from time of heparin drip resumption.  Babs Bertin, PharmD, BCPS Please check AMION for all [facility] contact numbers Clinical Pharmacist 09/03/2018 9:47 PM

## 2018-09-03 NOTE — Progress Notes (Signed)
CCM MD paged x 2 regarding critical value ABG results. Results were given to RN.

## 2018-09-03 NOTE — Consult Note (Addendum)
[facility] Nephrology Consultation Note  Requesting MD: Dr Earlean Polka Reason for consult: AKI  HPI:  Andre [name] is a 38 y.o. male with morbid obesity, chronic lymphedema, diastolic congestive heart failure, obstructive sleep apnea, hypoventilation syndrome requiring CPAP at night, hypertension initially admitted on 08/28/2018 for acute respiratory failure with hypoxia in the setting of CHF exacerbation.  Patient was treated with IV Lasix intermittently.  Patient had a cardiac arrest on 2/18, status post hypothermia protocol now on rewarming.  Patient is intubated, on Levophed and sedatives.  The Foley catheter was placed by urologist.  Urine output is trending down with around 300 cc of urine in the bag. At baseline, the serum creatinine level is around 0.7.  He received IV Lasix during this hospitalization and had a CT scan with contrast on 09/01/2018.  The serum creatinine level continue to trend up to 2.82, potassium level 5.0.  We are consulted for the evaluation and management of AKI. Review of system limited because patient is currently intubated and sedated.  I have discussed with ICU team and multiple family members including mother and his girlfriend.  Creatinine, Ser  Date/Time Value Ref Range Status  09/03/2018 01:20 PM 2.82 (H) 0.61 - 1.24 mg/dL Final  40/98/1191 47:82 AM 2.68 (H) 0.61 - 1.24 mg/dL Final  95/62/1308 65:78 AM 2.50 (H) 0.61 - 1.24 mg/dL Final  46/96/2952 84:13 AM 2.33 (H) 0.61 - 1.24 mg/dL Final  24/40/1027 25:36 PM 1.83 (H) 0.61 - 1.24 mg/dL Final  64/40/3474 25:95 AM 1.70 (H) 0.61 - 1.24 mg/dL Final  63/87/5643 32:95 AM 1.47 (H) 0.61 - 1.24 mg/dL Final  18/84/1660 63:01 PM 1.15 0.61 - 1.24 mg/dL Final  60/04/9322 55:73 PM 1.08 0.61 - 1.24 mg/dL Final  22/08/5425 06:23 PM 0.81 0.61 - 1.24 mg/dL Final  76/28/3151 76:16 AM 0.48 (L) 0.61 - 1.24 mg/dL Final  07/37/1062 69:48 AM 0.44 (L) 0.61 - 1.24 mg/dL Final  54/62/7035 00:93 AM 0.49 (L)  0.61 - 1.24 mg/dL Final  81/82/9937 16:96 AM 0.68 0.61 - 1.24 mg/dL Final  78/93/8101 75:10 AM 0.73 0.61 - 1.24 mg/dL Final  25/85/2778 24:23 PM 0.76 0.61 - 1.24 mg/dL Final  53/61/4431 54:00 AM 0.66 0.61 - 1.24 mg/dL Final  86/76/1950 93:26 AM 0.51 (L) 0.61 - 1.24 mg/dL Final  71/24/5809 98:33 AM 0.64 0.61 - 1.24 mg/dL Final  82/50/5397 67:34 AM 0.70 0.61 - 1.24 mg/dL Final  19/37/9024 09:73 AM 0.79 0.61 - 1.24 mg/dL Final  53/29/9242 68:34 AM 0.84 0.61 - 1.24 mg/dL Final  19/62/2297 98:92 PM 0.79 0.61 - 1.24 mg/dL Final  11/94/1740 81:44 AM 0.77 0.61 - 1.24 mg/dL Final  81/85/6314 97:02 AM 0.75 0.61 - 1.24 mg/dL Final  63/78/5885 02:77 AM 0.62 0.61 - 1.24 mg/dL Final  41/28/7867 67:20 PM 0.92 0.61 - 1.24 mg/dL Final  94/70/9628 36:62 PM 0.77 0.61 - 1.24 mg/dL Final  94/76/5465 03:54 AM 0.69 0.61 - 1.24 mg/dL Final  65/68/1275 17:00 AM 0.68 0.61 - 1.24 mg/dL Final  17/49/4496 75:91 AM 0.76 0.61 - 1.24 mg/dL Final  63/84/6659 93:57 PM 0.80 0.61 - 1.24 mg/dL Final  01/77/9390 30:09 AM 0.62 0.50 - 1.35 mg/dL Final  23/30/0762 26:33 AM 0.57 0.50 - 1.35 mg/dL Final  35/45/6256 38:93 PM 0.54 0.50 - 1.35 mg/dL Final  73/42/8768 11:57 AM 0.49 (L) 0.50 - 1.35 mg/dL Final  26/20/3559 74:16 AM 0.66 0.50 - 1.35 mg/dL Final  38/45/3646 80:32 AM 0.73 0.50 - 1.35 mg/dL Final  07/07/8249 03:70 AM  0.63 0.50 - 1.35 mg/dL Final  29/56/213008/04/2009 86:5709:09 PM 0.91 0.40 - 1.50 mg/dL Final  84/69/629507/19/2010 28:4104:02 AM 0.78 0.4 - 1.5 mg/dL Final  32/44/010207/18/2010 72:5304:00 AM 0.80 0.4 - 1.5 mg/dL Final  66/44/034707/17/2010 42:5903:25 AM 0.70 0.4 - 1.5 mg/dL Final  56/38/756407/16/2010 33:2904:10 AM 0.67 0.4 - 1.5 mg/dL Final  51/88/416607/15/2010 06:3003:55 AM 0.64 0.4 - 1.5 mg/dL Final  16/01/093207/14/2010 35:5704:10 AM 0.76 0.4 - 1.5 mg/dL Final  32/20/254207/06/2009 70:6201:55 PM 0.65 0.4 - 1.5 mg/dL Final  37/62/831507/11/2008 17:6111:52 PM 0.9 0.4 - 1.5 mg/dL Final  60/73/710603/29/2010 26:9407:00 AM 0.63 0.4 - 1.5 mg/dL Final  85/46/270303/28/2010 50:0905:35 AM 0.65 0.4 - 1.5 mg/dL Final  38/18/299303/27/2010 71:6904:14 AM 0.81 0.4 - 1.5 mg/dL Final   67/89/381003/26/2010 17:5103:42 PM 0.67 0.4 - 1.5 mg/dL Final     PMHx:   Past Medical History:  Diagnosis Date  . Anemia    iron deficient  . Cellulitis    lower extremities  . Cellulitis of scrotum    History of  . CHF (congestive heart failure) (HCC)   . History of acute bronchitis with bronchospasm   . Hypertension   . Morbid obesity (HCC)   . Obesity hypoventilation syndrome (HCC)   . Obstructive sleep apnea   . Panniculitis    History of  . Sleep apnea     History reviewed. No pertinent surgical history.  Family Hx:  Family History  Problem Relation Age of Onset  . Asthma Mother   . Cancer Father   . Hypertension Other     Social History:  reports that he has been smoking. He has a 9.00 pack-year smoking history. He has never used smokeless tobacco. He reports current alcohol use. He reports that he does not use drugs.  Allergies:  Allergies  Allergen Reactions  . Other Hives    Steroid, but cannot remember name.    Medications: Prior to Admission medications   Medication Sig Start Date End Date Taking? Authorizing Provider  clindamycin (CLEOCIN) 150 MG capsule Take 300 mg by mouth 4 (four) times daily. Take 300mg  by mouth four times daily 08/19/18  Yes [provider]  furosemide (LASIX) 40 MG tablet Take 1 tablet (40 mg total) by mouth daily. 06/23/18  Yes Antony MaduraHumes, Kelly, PA-C  ibuprofen (ADVIL,MOTRIN) 200 MG tablet Take 600 mg by mouth every 6 (six) hours as needed for moderate pain.   Yes [provider]  lisinopril (PRINIVIL,ZESTRIL) 5 MG tablet Take 1 tablet (5 mg total) by mouth daily. 06/23/18  Yes Antony MaduraHumes, Kelly, PA-C  albuterol (PROVENTIL HFA;VENTOLIN HFA) 108 (90 Base) MCG/ACT inhaler Inhale 2 puffs into the lungs every 6 (six) hours as needed for wheezing or shortness of breath. Patient not taking: Reported on 06/13/2018 01/25/18   Briant CedarEzenduka, Nkeiruka J, MD  amLODipine (NORVASC) 10 MG tablet Take 1 tablet (10 mg total) by mouth daily. Patient not  taking: Reported on 06/22/2018 01/25/18 02/24/18  Briant CedarEzenduka, Nkeiruka J, MD  diphenhydrAMINE-zinc acetate (BENADRYL) cream Apply topically 3 (three) times daily as needed for itching. 08/30/18   Alwyn RenMathews, Elizabeth G, MD  fluconazole (DIFLUCAN) 100 MG tablet Take 1 tablet (100 mg total) by mouth daily for 5 days. 08/30/18 09/04/18  Alwyn RenMathews, Elizabeth G, MD  furosemide (LASIX) 40 MG tablet Take 2 tablets (80 mg total) by mouth 2 (two) times daily for 30 days. 08/30/18 09/29/18  Alwyn RenMathews, Elizabeth G, MD  nystatin cream (MYCOSTATIN) Apply topically 2 (two) times daily. 08/30/18   Alwyn RenMathews, Elizabeth G, MD  potassium chloride SA (K-DUR,KLOR-CON) 20 MEQ tablet Take 1 tablet (20 mEq total) by mouth 2 (two) times daily. 08/30/18   Alwyn RenMathews, Elizabeth G, MD  pregabalin (LYRICA) 25 MG capsule Take 25 mg by mouth 3 (three) times daily. 08/05/18   [provider]    I have reviewed the patient's current medications.  Labs:  Results for orders placed or performed during the hospital encounter of May 09, 2019 (from the past 48 hour(s))  Arterial Blood Gas     Status: Abnormal   Collection Time: 09/01/18  3:15 PM  Result Value Ref Range   FIO2 100.00    Delivery systems VENTILATOR    Mode PRESSURE REGULATED VOLUME CONTROL    VT 510 mL   LHR 30 resp/min   Peep/cpap 5.0 cm H20   pH, Arterial 7.422 7.350 - 7.450   pCO2 arterial 69.6 (HH) 32.0 - 48.0 mmHg    Comment: RBV PETE BABCOCK,NP BY LISA CRADDOCK,RRT,RCP ON 09/01/2018 AT 1525   pO2, Arterial 67.9 (L) 83.0 - 108.0 mmHg   Bicarbonate 44.6 (H) 20.0 - 28.0 mmol/L   Acid-Base Excess 16.4 (H) 0.0 - 2.0 mmol/L   O2 Saturation 93.1 %   Patient temperature 98.6    Collection site RIGHT RADIAL    Drawn by [phone]    Sample type ARTERIAL DRAW    Allens test (pass/fail) PASS PASS    Comment: Performed at [facility], [address] [place], [place] [address]  Glucose, capillary     Status: Abnormal   Collection Time: 09/01/18  5:15 PM  Result  Value Ref Range   Glucose-Capillary 151 (H) 70 - 99 mg/dL  Basic metabolic panel     Status: Abnormal   Collection Time: 09/01/18  5:59 PM  Result Value Ref Range   Sodium 138 135 - 145 mmol/L   Potassium 3.4 (L) 3.5 - 5.1 mmol/L   Chloride 84 (L) 98 - 111 mmol/L   CO2 41 (H) 22 - 32 mmol/L   Glucose, Bld 141 (H) 70 - 99 mg/dL   BUN 17 6 - 20 mg/dL   Creatinine, Ser 0.101.08 0.61 - 1.24 mg/dL   Calcium 8.6 (L) 8.9 - 10.3 mg/dL   GFR calc non Af Amer >60 >60 mL/min   GFR calc Af Amer >60 >60 mL/min   Anion gap 13 5 - 15    Comment: Performed at [facility], [address] [place], [place] [address]  Blood gas, arterial     Status: Abnormal   Collection Time: 09/01/18  6:20 PM  Result Value Ref Range   FIO2 100.00    Delivery systems VENTILATOR    Mode PRESSURE REGULATED VOLUME CONTROL    VT 580.00 mL   LHR 30.00 resp/min   Peep/cpap 10.0 cm H20   pH, Arterial 7.578 (H) 7.350 - 7.450   pCO2 arterial 47.3 32.0 - 48.0 mmHg   pO2, Arterial 57.6 (L) 83.0 - 108.0 mmHg   Bicarbonate 45.5 (H) 20.0 - 28.0 mmol/L   Acid-Base Excess 20.6 (H) 0.0 - 2.0 mmol/L   O2 Saturation 95.4 %   Patient temperature 94.1    Collection site ARTERIAL DRAW    Drawn by [phone]    Sample type ARTERIAL DRAW    Allens test (pass/fail) PASS PASS  Glucose, capillary     Status: Abnormal   Collection Time: 09/01/18  6:24 PM  Result Value Ref Range   Glucose-Capillary 133 (H) 70 - 99 mg/dL  Basic metabolic  panel     Status: Abnormal   Collection Time: 09/01/18  7:00 PM  Result Value Ref Range   Sodium 137 135 - 145 mmol/L   Potassium 3.3 (L) 3.5 - 5.1 mmol/L   Chloride 83 (L) 98 - 111 mmol/L   CO2 41 (H) 22 - 32 mmol/L   Glucose, Bld 122 (H) 70 - 99 mg/dL   BUN 18 6 - 20 mg/dL   Creatinine, Ser 6.96 0.61 - 1.24 mg/dL   Calcium 8.9 8.9 - 29.5 mg/dL   GFR calc non Af Amer >60 >60 mL/min   GFR calc Af Amer >60 >60 mL/min   Anion gap 13 5 - 15    Comment: Performed at [facility], [address], [place], [place] [address]  Glucose, capillary     Status: Abnormal   Collection Time: 09/01/18  7:02 PM  Result Value Ref Range   Glucose-Capillary 134 (H) 70 - 99 mg/dL  Glucose, capillary     Status: Abnormal   Collection Time: 09/01/18  8:40 PM  Result Value Ref Range   Glucose-Capillary 117 (H) 70 - 99 mg/dL  I-STAT 7, (LYTES, BLD GAS, ICA, H+H)     Status: Abnormal   Collection Time: 09/01/18  8:41 PM  Result Value Ref Range   pH, Arterial 7.470 (H) 7.350 - 7.450   pCO2 arterial 65.7 (HH) 32.0 - 48.0 mmHg   pO2, Arterial 169.0 (H) 83.0 - 108.0 mmHg   Bicarbonate 49.5 (H) 20.0 - 28.0 mmol/L   TCO2 >50 (H) 22 - 32 mmol/L   O2 Saturation 100.0 %   Acid-Base Excess 20.0 (H) 0.0 - 2.0 mmol/L   Sodium 138 135 - 145 mmol/L   Potassium 3.2 (L) 3.5 - 5.1 mmol/L   Calcium, Ion 1.13 (L) 1.15 - 1.40 mmol/L   HCT 43.0 39.0 - 52.0 %   Hemoglobin 14.6 13.0 - 17.0 g/dL   Patient temperature 24.4 C    Sample type ARTERIAL   Glucose, capillary     Status: Abnormal   Collection Time: 09/01/18  9:55 PM  Result Value Ref Range   Glucose-Capillary 127 (H) 70 - 99 mg/dL  Glucose, capillary     Status: Abnormal   Collection Time: 09/01/18 10:46 PM  Result Value Ref Range   Glucose-Capillary 124 (H) 70 - 99 mg/dL  Glucose, capillary     Status: Abnormal   Collection Time: 09/01/18 11:54 PM  Result Value Ref Range   Glucose-Capillary 105 (H) 70 - 99 mg/dL  Glucose, capillary     Status: Abnormal   Collection Time: 09/02/18 12:50 AM  Result Value Ref Range   Glucose-Capillary 103 (H) 70 - 99 mg/dL  Protime-INR     Status: Abnormal   Collection Time: 09/02/18  1:05 AM  Result Value Ref Range   Prothrombin Time 15.6 (H) 11.4 - 15.2 seconds   INR 1.25     Comment: Performed at [facility], [address], [place], [place] [address]  Glucose, capillary     Status: None   Collection Time: 09/02/18  2:39 AM  Result Value Ref Range   Glucose-Capillary 99 70 - 99 mg/dL  Glucose,  capillary     Status: None   Collection Time: 09/02/18  4:17 AM  Result Value Ref Range   Glucose-Capillary 97 70 - 99 mg/dL  I-STAT 7, (LYTES, BLD GAS, ICA, H+H)     Status: Abnormal   Collection Time: 09/02/18  4:28 AM  Result Value Ref Range   pH, Arterial 7.423 7.350 - 7.450   pCO2 arterial 71.4 (HH) 32.0 - 48.0 mmHg   pO2, Arterial 83.0 83.0 - 108.0 mmHg   Bicarbonate 48.5 (H) 20.0 - 28.0 mmol/L   TCO2 >50 (H) 22 - 32 mmol/L   O2 Saturation 97.0 %   Acid-Base Excess 18.0 (H) 0.0 - 2.0 mmol/L   Sodium 138 135 - 145 mmol/L   Potassium 3.5 3.5 - 5.1 mmol/L   Calcium, Ion 1.13 (L) 1.15 - 1.40 mmol/L   HCT 41.0 39.0 - 52.0 %   Hemoglobin 13.9 13.0 - 17.0 g/dL   Patient temperature 16.1 C    Sample type ARTERIAL    Comment NOTIFIED PHYSICIAN   CBC     Status: Abnormal   Collection Time: 09/02/18  4:30 AM  Result Value Ref Range   WBC 7.3 4.0 - 10.5 K/uL   RBC 4.84 4.22 - 5.81 MIL/uL   Hemoglobin 12.6 (L) 13.0 - 17.0 g/dL   HCT 09.6 04.5 - 40.9 %   MCV 90.1 80.0 - 100.0 fL   MCH 26.0 26.0 - 34.0 pg   MCHC 28.9 (L) 30.0 - 36.0 g/dL   RDW 81.1 (H) 91.4 - 78.2 %   Platelets 144 (L) 150 - 400 K/uL   nRBC 0.0 0.0 - 0.2 %    Comment: Performed at [facility], [address], [place], [place] [address]  Basic metabolic panel     Status: Abnormal   Collection Time: 09/02/18  4:30 AM  Result Value Ref Range   Sodium 139 135 - 145 mmol/L   Potassium 3.3 (L) 3.5 - 5.1 mmol/L   Chloride 87 (L) 98 - 111 mmol/L   CO2 44 (H) 22 - 32 mmol/L   Glucose, Bld 101 (H) 70 - 99 mg/dL   BUN 23 (H) 6 - 20 mg/dL   Creatinine, Ser 3.08 (H) 0.61 - 1.24 mg/dL   Calcium 8.5 (L) 8.9 - 10.3 mg/dL   GFR calc non Af Amer 60 (L) >60 mL/min   GFR calc Af Amer >60 >60 mL/min   Anion gap 8 5 - 15    Comment: Performed at [facility], [address], [place], [place] [address]  Magnesium     Status: None   Collection Time: 09/02/18  4:30 AM  Result Value Ref Range   Magnesium 2.1 1.7 - 2.4  mg/dL    Comment: Performed at [facility], [address] [place], [place] [address]  Phosphorus     Status: None   Collection Time: 09/02/18  4:30 AM  Result Value Ref Range   Phosphorus 3.9 2.5 - 4.6 mg/dL    Comment: Performed at [facility], [address], [place], [place] [address]  Troponin I - Now Then Q6H     Status: Abnormal   Collection Time: 09/02/18  4:30 AM  Result Value Ref Range   Troponin I 0.58 (HH) <0.03 ng/mL    Comment: CRITICAL RESULT CALLED TO, READ BACK BY AND VERIFIED WITH: [facility] M,RN 09/02/18 0525 WAYK Performed at [facility], [address], [place], [place] [address]   I-STAT 4, (NA,K, GLUC, HGB,HCT)     Status: Abnormal   Collection Time: 09/02/18  4:33 AM  Result Value Ref Range   Sodium 139 135 - 145 mmol/L   Potassium 3.5 3.5 - 5.1 mmol/L   Glucose, Bld 103 (H) 70 - 99 mg/dL  HCT 44.0 39.0 - 52.0 %   Hemoglobin 15.0 13.0 - 17.0 g/dL  Glucose, capillary     Status: None   Collection Time: 09/02/18  6:31 AM  Result Value Ref Range   Glucose-Capillary 96 70 - 99 mg/dL  Troponin I - Now Then Q6H     Status: Abnormal   Collection Time: 09/02/18  6:47 AM  Result Value Ref Range   Troponin I 0.48 (HH) <0.03 ng/mL    Comment: CRITICAL VALUE NOTED.  VALUE IS CONSISTENT WITH PREVIOUSLY REPORTED AND CALLED VALUE. Performed at [facility], [address], [place], [place] [address]   Glucose, capillary     Status: None   Collection Time: 09/02/18  8:15 AM  Result Value Ref Range   Glucose-Capillary 89 70 - 99 mg/dL  Glucose, capillary     Status: None   Collection Time: 09/02/18  9:56 AM  Result Value Ref Range   Glucose-Capillary 75 70 - 99 mg/dL  Basic metabolic panel     Status: Abnormal   Collection Time: 09/02/18 11:03 AM  Result Value Ref Range   Sodium 138 135 - 145 mmol/L   Potassium 3.7 3.5 - 5.1 mmol/L   Chloride 87 (L) 98 - 111 mmol/L   CO2 39 (H) 22 - 32 mmol/L   Glucose, Bld 74 70 - 99 mg/dL   BUN 25  (H) 6 - 20 mg/dL   Creatinine, Ser 6.04 (H) 0.61 - 1.24 mg/dL   Calcium 8.6 (L) 8.9 - 10.3 mg/dL   GFR calc non Af Amer 50 (L) >60 mL/min   GFR calc Af Amer 58 (L) >60 mL/min   Anion gap 12 5 - 15    Comment: Performed at [facility], [address] [place], [place] [address]  Troponin I - Once     Status: Abnormal   Collection Time: 09/02/18 11:03 AM  Result Value Ref Range   Troponin I 0.39 (HH) <0.03 ng/mL    Comment: CRITICAL VALUE NOTED.  VALUE IS CONSISTENT WITH PREVIOUSLY REPORTED AND CALLED VALUE. Performed at [facility], [address], [place], [place] [address]   Glucose, capillary     Status: None   Collection Time: 09/02/18 11:53 AM  Result Value Ref Range   Glucose-Capillary 74 70 - 99 mg/dL  I-STAT 7, (LYTES, BLD GAS, ICA, H+H)     Status: Abnormal   Collection Time: 09/02/18  1:16 PM  Result Value Ref Range   pH, Arterial 7.495 (H) 7.350 - 7.450   pCO2 arterial 60.5 (H) 32.0 - 48.0 mmHg   pO2, Arterial 65.0 (L) 83.0 - 108.0 mmHg   Bicarbonate 48.4 (H) 20.0 - 28.0 mmol/L   TCO2 >50 (H) 22 - 32 mmol/L   O2 Saturation 96.0 %   Acid-Base Excess 20.0 (H) 0.0 - 2.0 mmol/L   Sodium 138 135 - 145 mmol/L   Potassium 3.7 3.5 - 5.1 mmol/L   Calcium, Ion 1.13 (L) 1.15 - 1.40 mmol/L   HCT 40.0 39.0 - 52.0 %   Hemoglobin 13.6 13.0 - 17.0 g/dL   Patient temperature 78.2 C    Sample type ARTERIAL    Comment VALUES EXPECTED, NO REPEAT   Glucose, capillary     Status: None   Collection Time: 09/02/18  2:00 PM  Result Value Ref Range   Glucose-Capillary 75 70 - 99 mg/dL  Magnesium     Status: None   Collection Time: 09/02/18  2:33 PM  Result Value Ref Range   Magnesium 1.9 1.7 - 2.4 mg/dL    Comment: Performed at [facility], [address] [place], [place] [address]  Phosphorus     Status: None   Collection Time: 09/02/18  2:33 PM  Result Value Ref Range   Phosphorus 3.7 2.5 - 4.6 mg/dL    Comment: Performed at [facility], [address], [place], [place] [address]  Troponin I - Now Then Q6H     Status: Abnormal   Collection Time: 09/02/18  3:22 PM  Result Value Ref Range   Troponin I 0.34 (HH) <0.03 ng/mL    Comment: CRITICAL VALUE NOTED.  VALUE IS CONSISTENT WITH PREVIOUSLY REPORTED AND CALLED VALUE. Performed at [facility], [address], [place], [place] [address]   Glucose, capillary     Status: None   Collection Time: 09/02/18  3:26 PM  Result Value Ref Range   Glucose-Capillary 76 70 - 99 mg/dL  Basic metabolic panel     Status: Abnormal   Collection Time: 09/02/18  5:00 PM  Result Value Ref Range   Sodium 139 135 - 145 mmol/L   Potassium 3.7 3.5 - 5.1 mmol/L   Chloride 88 (L) 98 - 111 mmol/L   CO2 39 (H) 22 - 32 mmol/L   Glucose, Bld 82 70 - 99 mg/dL   BUN 27 (H) 6 - 20 mg/dL   Creatinine, Ser 4.69 (H) 0.61 - 1.24 mg/dL   Calcium 8.6 (L) 8.9 - 10.3 mg/dL   GFR calc non Af Amer 46 (L) >60 mL/min   GFR calc Af Amer 53 (L) >60 mL/min   Anion gap 12 5 - 15    Comment: Performed at [facility], [address], [place], [place] [address]  Heparin level (unfractionated)     Status: None   Collection Time: 09/02/18  6:52 PM  Result Value Ref Range   Heparin Unfractionated 0.32 0.30 - 0.70 IU/mL    Comment: (NOTE) If heparin results are below expected values, and patient dosage has  been confirmed, suggest follow up testing of antithrombin III levels. Performed at [facility], [address], [place], [place] [address]   Glucose, capillary     Status: None   Collection Time: 09/02/18  7:54 PM  Result Value Ref Range   Glucose-Capillary 89 70 - 99 mg/dL  Heparin level (unfractionated)     Status: None   Collection Time: 09/02/18  8:48 PM  Result Value Ref Range   Heparin Unfractionated 0.32 0.30 - 0.70 IU/mL    Comment: (NOTE) If heparin results are below expected values, and patient dosage has  been confirmed, suggest follow up testing of antithrombin III levels. Performed at  [name], [address], [place], [place] [address]   I-STAT 4, (NA,K, GLUC, HGB,HCT)     Status: None   Collection Time: 09/02/18  8:49 PM  Result Value Ref Range   Sodium 138 135 - 145 mmol/L   Potassium 3.8 3.5 - 5.1 mmol/L   Glucose, Bld 93 70 - 99 mg/dL   HCT 27.2 53.6 - 64.4 %   Hemoglobin 14.3 13.0 - 17.0 g/dL  Glucose, capillary     Status: Abnormal   Collection Time: 09/02/18 11:05 PM  Result Value Ref Range   Glucose-Capillary 112 (H) 70 - 99 mg/dL  I-STAT 4, (NA,K, GLUC, HGB,HCT)     Status: Abnormal   Collection Time:  09/02/18 11:06 PM  Result Value Ref Range   Sodium 138 135 - 145 mmol/L   Potassium 3.8 3.5 - 5.1 mmol/L   Glucose, Bld 111 (H) 70 - 99 mg/dL   HCT 69.6 29.5 - 28.4 %   Hemoglobin 15.3 13.0 - 17.0 g/dL  Troponin I - Now Then Q6H     Status: Abnormal   Collection Time: 09/02/18 11:07 PM  Result Value Ref Range   Troponin I 0.25 (HH) <0.03 ng/mL    Comment: CRITICAL VALUE NOTED.  VALUE IS CONSISTENT WITH PREVIOUSLY REPORTED AND CALLED VALUE. Performed at [facility], [address] [place], [place] [address]   I-STAT 4, (NA,K, GLUC, HGB,HCT)     Status: Abnormal   Collection Time: 09/03/18  1:11 AM  Result Value Ref Range   Sodium 138 135 - 145 mmol/L   Potassium 4.0 3.5 - 5.1 mmol/L   Glucose, Bld 115 (H) 70 - 99 mg/dL   HCT 01.0 27.2 - 53.6 %   Hemoglobin 15.0 13.0 - 17.0 g/dL  Glucose, capillary     Status: None   Collection Time: 09/03/18  3:36 AM  Result Value Ref Range   Glucose-Capillary 94 70 - 99 mg/dL  Basic metabolic panel     Status: Abnormal   Collection Time: 09/03/18  3:37 AM  Result Value Ref Range   Sodium 138 135 - 145 mmol/L   Potassium 4.1 3.5 - 5.1 mmol/L   Chloride 89 (L) 98 - 111 mmol/L   CO2 37 (H) 22 - 32 mmol/L   Glucose, Bld 108 (H) 70 - 99 mg/dL   BUN 31 (H) 6 - 20 mg/dL   Creatinine, Ser 6.44 (H) 0.61 - 1.24 mg/dL   Calcium 8.5 (L) 8.9 - 10.3 mg/dL   GFR calc non Af Amer 34 (L) >60 mL/min    GFR calc Af Amer 40 (L) >60 mL/min   Anion gap 12 5 - 15    Comment: Performed at [facility], [address], [place], [place] [address]  CBC     Status: Abnormal   Collection Time: 09/03/18  3:37 AM  Result Value Ref Range   WBC 8.7 4.0 - 10.5 K/uL   RBC 5.01 4.22 - 5.81 MIL/uL   Hemoglobin 13.0 13.0 - 17.0 g/dL   HCT 25.9 56.3 - 87.5 %   MCV 91.6 80.0 - 100.0 fL   MCH 25.9 (L) 26.0 - 34.0 pg   MCHC 28.3 (L) 30.0 - 36.0 g/dL   RDW 64.3 (H) 32.9 - 51.8 %   Platelets 132 (L) 150 - 400 K/uL   nRBC 0.0 0.0 - 0.2 %    Comment: Performed at [facility], [address], [place], [place] [address]  Magnesium     Status: None   Collection Time: 09/03/18  3:37 AM  Result Value Ref Range   Magnesium 2.0 1.7 - 2.4 mg/dL    Comment: Performed at [facility], [address] [place], [place] [address]  Phosphorus     Status: Abnormal   Collection Time: 09/03/18  3:37 AM  Result Value Ref Range   Phosphorus 5.4 (H) 2.5 - 4.6 mg/dL    Comment: Performed at [facility], [address] [place], [place] [address]  I-STAT 4, (NA,K, GLUC, HGB,HCT)     Status: Abnormal   Collection Time: 09/03/18  3:38 AM  Result Value Ref Range   Sodium 138 135 - 145  mmol/L   Potassium 4.1 3.5 - 5.1 mmol/L   Glucose, Bld 109 (H) 70 - 99 mg/dL   HCT 26.2 03.5 - 59.7 %   Hemoglobin 14.6 13.0 - 17.0 g/dL  Heparin level (unfractionated)     Status: Abnormal   Collection Time: 09/03/18  3:43 AM  Result Value Ref Range   Heparin Unfractionated 0.19 (L) 0.30 - 0.70 IU/mL    Comment: (NOTE) If heparin results are below expected values, and patient dosage has  been confirmed, suggest follow up testing of antithrombin III levels. Performed at [facility], [address] [place], [place] [address]   I-STAT 4, (NA,K, GLUC, HGB,HCT)     Status: Abnormal   Collection Time: 09/03/18  5:38 AM  Result Value Ref Range   Sodium 138 135 - 145 mmol/L   Potassium 4.2 3.5 - 5.1 mmol/L    Glucose, Bld 105 (H) 70 - 99 mg/dL   HCT 45.3 64.6 - 80.3 %   Hemoglobin 15.0 13.0 - 17.0 g/dL  I-STAT 4, (NA,K, GLUC, HGB,HCT)     Status: Abnormal   Collection Time: 09/03/18  7:01 AM  Result Value Ref Range   Sodium 138 135 - 145 mmol/L   Potassium 4.3 3.5 - 5.1 mmol/L   Glucose, Bld 100 (H) 70 - 99 mg/dL   HCT 21.2 24.8 - 25.0 %   Hemoglobin 15.0 13.0 - 17.0 g/dL  Glucose, capillary     Status: None   Collection Time: 09/03/18  7:49 AM  Result Value Ref Range   Glucose-Capillary 89 70 - 99 mg/dL  Basic metabolic panel     Status: Abnormal   Collection Time: 09/03/18  8:50 AM  Result Value Ref Range   Sodium 138 135 - 145 mmol/L   Potassium 4.4 3.5 - 5.1 mmol/L   Chloride 89 (L) 98 - 111 mmol/L   CO2 36 (H) 22 - 32 mmol/L   Glucose, Bld 102 (H) 70 - 99 mg/dL   BUN 32 (H) 6 - 20 mg/dL   Creatinine, Ser 0.37 (H) 0.61 - 1.24 mg/dL   Calcium 8.4 (L) 8.9 - 10.3 mg/dL   GFR calc non Af Amer 31 (L) >60 mL/min   GFR calc Af Amer 36 (L) >60 mL/min   Anion gap 13 5 - 15    Comment: Performed at [facility], [address], [place], [place] [address]  I-STAT 7, (LYTES, BLD GAS, ICA, H+H)     Status: Abnormal   Collection Time: 09/03/18  9:30 AM  Result Value Ref Range   pH, Arterial 7.296 (L) 7.350 - 7.450   pCO2 arterial 91.2 (HH) 32.0 - 48.0 mmHg   pO2, Arterial 64.0 (L) 83.0 - 108.0 mmHg   Bicarbonate 45.0 (H) 20.0 - 28.0 mmol/L   TCO2 48 (H) 22 - 32 mmol/L   O2 Saturation 89.0 %   Acid-Base Excess 13.0 (H) 0.0 - 2.0 mmol/L   Sodium 139 135 - 145 mmol/L   Potassium 4.7 3.5 - 5.1 mmol/L   Calcium, Ion 1.12 (L) 1.15 - 1.40 mmol/L   HCT 42.0 39.0 - 52.0 %   Hemoglobin 14.3 13.0 - 17.0 g/dL   Patient temperature 91.6 C    Sample type ARTERIAL    Comment VALUES EXPECTED, NO REPEAT   I-STAT 7, (LYTES, BLD GAS, ICA, H+H)     Status: Abnormal   Collection Time: 09/03/18 11:02 AM  Result Value Ref Range   pH, Arterial 7.286 (L)  7.350 - 7.450   pCO2 arterial 91.9 (HH) 32.0  - 48.0 mmHg   pO2, Arterial 67.0 (L) 83.0 - 108.0 mmHg   Bicarbonate 44.2 (H) 20.0 - 28.0 mmol/L   TCO2 47 (H) 22 - 32 mmol/L   O2 Saturation 90.0 %   Acid-Base Excess 13.0 (H) 0.0 - 2.0 mmol/L   Sodium 138 135 - 145 mmol/L   Potassium 4.8 3.5 - 5.1 mmol/L   Calcium, Ion 1.11 (L) 1.15 - 1.40 mmol/L   HCT 42.0 39.0 - 52.0 %   Hemoglobin 14.3 13.0 - 17.0 g/dL   Patient temperature 76.1 C    Sample type ARTERIAL    Comment VALUES EXPECTED, NO REPEAT   Basic metabolic panel     Status: Abnormal   Collection Time: 09/03/18 11:29 AM  Result Value Ref Range   Sodium 139 135 - 145 mmol/L   Potassium 4.9 3.5 - 5.1 mmol/L   Chloride 89 (L) 98 - 111 mmol/L   CO2 39 (H) 22 - 32 mmol/L   Glucose, Bld 96 70 - 99 mg/dL   BUN 32 (H) 6 - 20 mg/dL   Creatinine, Ser 9.50 (H) 0.61 - 1.24 mg/dL   Calcium 8.4 (L) 8.9 - 10.3 mg/dL   GFR calc non Af Amer 29 (L) >60 mL/min   GFR calc Af Amer 33 (L) >60 mL/min   Anion gap 11 5 - 15    Comment: Performed at [facility], [address], [place], [place] [address]  Culture, respiratory     Status: None (Preliminary result)   Collection Time: 09/03/18 11:48 AM  Result Value Ref Range   Specimen Description TRACHEAL ASPIRATE    Special Requests NONE    Gram Stain      RARE WBC PRESENT, PREDOMINANTLY PMN NO ORGANISMS SEEN Performed at [facility], [address], [place], [place] [address]    Culture PENDING    Report Status PENDING   Glucose, capillary     Status: None   Collection Time: 09/03/18 12:01 PM  Result Value Ref Range   Glucose-Capillary 94 70 - 99 mg/dL  Basic metabolic panel     Status: Abnormal   Collection Time: 09/03/18  1:20 PM  Result Value Ref Range   Sodium 140 135 - 145 mmol/L   Potassium 5.0 3.5 - 5.1 mmol/L   Chloride 90 (L) 98 - 111 mmol/L   CO2 42 (H) 22 - 32 mmol/L   Glucose, Bld 92 70 - 99 mg/dL   BUN 33 (H) 6 - 20 mg/dL   Creatinine, Ser 0.99 (H) 0.61 - 1.24 mg/dL   Calcium 8.4 (L) 8.9 - 10.3 mg/dL    GFR calc non Af Amer 27 (L) >60 mL/min   GFR calc Af Amer 31 (L) >60 mL/min   Anion gap 8 5 - 15    Comment: Performed at [facility], [address] [place], [place] [address]     ROS:  Pertinent items noted in HPI and remainder of comprehensive ROS otherwise negative.  Physical Exam: Vitals:   09/03/18 1330 09/03/18 1345  BP:    Pulse:    Resp: 18 (!) 25  Temp:    SpO2: 92% 91%     General exam: Morbidly obese male, lying in bed, intubated and sedated. Respiratory system: Coarse breath sound bilateral,  cardiovascular system: S1 & S2 heard, RRR.  Nonpitting chronic lymphedema. Gastrointestinal system: Abdomen is soft. Normal bowel sounds heard. Central  nervous system: Sedated. Extremities: No joint deformity or cyanosis. Skin: No rashes, lesions or ulcers Psychiatry: Unable to examine because of sedation.  Assessment/Plan:  #Oliguric AKI likely ATN in the setting of cardiac arrest and contrast nephropathy. The urine output is very minimal and serum creatinine level is continue to elevate to 2.82 today.  At baseline, patient has normal serum creatinine level.  Urinalysis on admission showed no RBC however has many bacteria, WBC and 100 protein. Contiue foley catheter with strict I/O.  The CT scan abdomen pelvis on 01/23/2018 showed normal kidneys without hydronephrosis.  Ultrasound kidneys will be limited because of body habitus. -I will order Lasix 120 mg IV 1 dose.  Monitor urine output and BMP.  If kidney function worsen we may try short session of RRT but I do not think patient will be able to do outpatient treatment.  I have discussed this with the patient's multiple family members today.  #Cardiac arrest: Status post hypothermia protocol.  Echo showed EF of 55 to 60%.  #Shock: Currently on Levophed.  Monitor blood pressure.  #Acute respiratory failure/respiratory acidosis: Currently on vent per PCCM.  #Morbid obesity  I have discussed with ICU team.  We will  continue to follow with you.  Eyanna Mcgonagle Jaynie Collins 09/03/2018, 1:52 PM  [facility].

## 2018-09-03 NOTE — Progress Notes (Signed)
eLink Physician-Brief Progress Note Patient Name: Andre [name] DOB: 03/27/1981 MRN: [id number]   Date of Service  09/03/2018  HPI/Events of Note  Informed by RN regarding ABG results.  Pt is undergoing CRRT.  Pt remains hypotensive and has increased O2 demand.  Pt is obese, intubated.  eICU Interventions  APRN at the bedside to insert HD cath.  Pt remains hypercapnic on PC and has high O2 demand.  Tolerate O2 sats >88%. Recheck ABG after line placement.      Intervention Category Intermediate Interventions: Other:  Larinda Buttery 09/03/2018, 8:04 PM

## 2018-09-03 NOTE — Progress Notes (Signed)
   09/03/18 1400  Neurological  Neuro (WDL) X  Level of Consciousness Unresponsive  Orientation Level Intubated/Tracheostomy - Unable to assess  Cognition Unable to follow commands  Speech Intubated/Tracheostomy - Unable to assess  R Pupil Size (mm) 2  R Pupil Shape Round  R Pupil Reaction Sluggish  L Pupil Size (mm) 2  L Pupil Shape Round  L Pupil Reaction Sluggish  R Hand Grip Absent  L Hand Grip Absent   RUE Motor Response Other (Comment);No movment to painful stimulus (flickers of movement)  LUE Motor Response Other (Comment);No movement to painful stimulus (flickers of movement)  RLE Motor Response Other (Comment);No movement to painful stimulus (flickers of movement)  LLE Motor Response Other (Comment);No movement to painful stimulus (flickers of movement)    Neuro exam performed. Patient with no response to sternal rub or peripheral painful stimuli. Patient has had intermittent, spontaneous movements of both hands and feet. Pupils remain equal and sluggishly reactive. Will continue to monitor and reassess.

## 2018-09-03 NOTE — Progress Notes (Addendum)
Spoke with Canary Brim, NP from CCM about trying patient on PC due to high CO2. Placed patient on PC 30, rate 26, FIO2 100, and PEEP 14. PIP have decreased and VE increased from 8 to 12.6. Patient seems to be tolerating very well. NP said get an ABG in 1-1.5 hours to see if this helps. NP to touch base with MD

## 2018-09-03 NOTE — Progress Notes (Signed)
ANTICOAGULATION CONSULT NOTE  Pharmacy Consult for heparin  Indication: Rule-out PE  Allergies  Allergen Reactions  . Other Hives    Steroid, but cannot remember name.    Patient Measurements: Height: 5\' 6"  (167.6 cm) Weight: (!) 407 lb 3 oz (184.7 kg) IBW/kg (Calculated) : 63.8 Heparin Dosing Weight: 122 kg   Vital Signs: Temp: 99 F (37.2 C) (02/20 1400) Temp Source: Esophageal (02/20 1400) BP: 104/46 (02/20 1300) Pulse Rate: 95 (02/20 1138)  Labs: Recent Labs    09/01/18 0532  09/01/18 1304  09/02/18 0105  09/02/18 0430  09/02/18 1103  09/02/18 1522  09/02/18 2048  09/02/18 2307  09/03/18 0337  09/03/18 0343  09/03/18 0701 09/03/18 0850 09/03/18 0930 09/03/18 1102 09/03/18 1129 09/03/18 1318 09/03/18 1320  HGB 12.9*  --   --    < >  --    < > 12.6*   < >  --    < >  --   --   --    < >  --    < > 13.0   < >  --    < > 15.0  --  14.3 14.3  --   --   --   HCT 46.4  --   --    < >  --    < > 43.6   < >  --    < >  --   --   --    < >  --    < > 45.9   < >  --    < > 44.0  --  42.0 42.0  --   --   --   PLT 152  --   --   --   --   --  144*  --   --   --   --   --   --   --   --   --  132*  --   --   --   --   --   --   --   --   --   --   APTT  --   --  30  --   --   --   --   --   --   --   --   --   --   --   --   --   --   --   --   --   --   --   --   --   --   --   --   LABPROT  --   --  15.4*  --  15.6*  --   --   --   --   --   --   --   --   --   --   --   --   --   --   --   --   --   --   --   --   --   --   INR  --   --  1.24  --  1.25  --   --   --   --   --   --   --   --   --   --   --   --   --   --   --   --   --   --   --   --   --   --  HEPARINUNFRC  --   --   --   --   --   --   --   --   --   --   --    < > 0.32  --   --   --   --   --  0.19*  --   --   --   --   --   --  0.15*  --   CREATININE 0.48*  --  0.81   < >  --   --  1.47*  --  1.70*  --   --    < >  --   --   --   --  2.33*  --   --   --   --  2.50*  --   --  2.68*  --  2.82*   TROPONINI  --    < > 0.04*  --   --   --  0.58*   < > 0.39*  --  0.34*  --   --   --  0.25*  --   --   --   --   --   --   --   --   --   --   --   --    < > = values in this interval not displayed.    Estimated Creatinine Clearance: 56.4 mL/min (A) (by C-G formula based on SCr of 2.82 mg/dL (H)).   Assessment: 58 yoM s/p code blue with PEA arrest initially,ventricular fibrillation and at one point this looks like torsades.   CT neg for mediastinal hematoma, head CT neg for bleed. CT chest was completed that was non-diagnostic of PE (insufficient contrast). Currently undergoing TTM for cardiac arrest - pt started rewarming ~2100 2/19.  Heparin level down to 0.15 (subtherapeutic) on gtt at 1400 units/hr - likely due to rewarming so clearing faster. Hgb stable, plt down to 132. No issues with line or bleeding reported per RN.  Of note, medication order did not reflect the 1400 units/hr change from this morning, but RN confirmed that heparin has been running at this rate since around 5 am this morning.   Goals: Heparin level: 0.3-0.7 units/ml Monitor pltc    Plan:  Increase heparin infusion to 1800 units/hr F/u 6 hr heparin level  Danae Orleans, PharmD PGY1 Pharmacy Resident Phone [phone] 09/03/2018       2:42 PM

## 2018-09-03 NOTE — Progress Notes (Addendum)
Brief follow up note: Patient received lasix 120 mg with no increase in urine output.  PCCM contacted [place] for requiring 100 % Fio2 and concern for fluid overload. He is on levophed 22 mcg and on systemic heparin. I have discussed with multiple family members regarding plan to initiate CRRT.  -will start CRRT with 4k, UF 100 cc/hr after catheter placement by PCCM.

## 2018-09-03 NOTE — Progress Notes (Signed)
Family asked for prayer and spiritual support.  Chaplain prayed and listened to family.   Phebe Colla, Chaplain    09/03/18 1100  Clinical Encounter Type  Visited With Patient and family together  Visit Type Spiritual support  Referral From Family  Consult/Referral To Chaplain  Spiritual Encounters  Spiritual Needs Prayer;Emotional  Stress Factors  Patient Stress Factors Not reviewed  Family Stress Factors Not reviewed

## 2018-09-03 NOTE — Progress Notes (Signed)
Fentanyl bag noted to be damaged and leaking. Bag and tubing changed. 225cc Fentanyl wasted in stericycle and witnessed by Araceli Bouche, RN.

## 2018-09-04 ENCOUNTER — Inpatient Hospital Stay (HOSPITAL_COMMUNITY): Payer: Medicare Other

## 2018-09-04 DIAGNOSIS — I469 Cardiac arrest, cause unspecified: Secondary | ICD-10-CM

## 2018-09-04 DIAGNOSIS — R6521 Severe sepsis with septic shock: Secondary | ICD-10-CM

## 2018-09-04 DIAGNOSIS — E662 Morbid (severe) obesity with alveolar hypoventilation: Secondary | ICD-10-CM

## 2018-09-04 DIAGNOSIS — A419 Sepsis, unspecified organism: Secondary | ICD-10-CM

## 2018-09-04 DIAGNOSIS — R69 Illness, unspecified: Secondary | ICD-10-CM

## 2018-09-04 DIAGNOSIS — I5033 Acute on chronic diastolic (congestive) heart failure: Secondary | ICD-10-CM

## 2018-09-04 LAB — RENAL FUNCTION PANEL
ANION GAP: 7 (ref 5–15)
Albumin: 2.2 g/dL — ABNORMAL LOW (ref 3.5–5.0)
Albumin: 2.4 g/dL — ABNORMAL LOW (ref 3.5–5.0)
Anion gap: 8 (ref 5–15)
BUN: 36 mg/dL — ABNORMAL HIGH (ref 6–20)
BUN: 33 mg/dL — ABNORMAL HIGH (ref 6–20)
CALCIUM: 8.4 mg/dL — AB (ref 8.9–10.3)
CO2: 38 mmol/L — ABNORMAL HIGH (ref 22–32)
CO2: 34 mmol/L — ABNORMAL HIGH (ref 22–32)
Calcium: 8.2 mg/dL — ABNORMAL LOW (ref 8.9–10.3)
Chloride: 91 mmol/L — ABNORMAL LOW (ref 98–111)
Chloride: 96 mmol/L — ABNORMAL LOW (ref 98–111)
Creatinine, Ser: 3.24 mg/dL — ABNORMAL HIGH (ref 0.61–1.24)
Creatinine, Ser: 3.21 mg/dL — ABNORMAL HIGH (ref 0.61–1.24)
GFR calc Af Amer: 27 mL/min — ABNORMAL LOW (ref >60)
GFR calc Af Amer: 27 mL/min — ABNORMAL LOW (ref >60)
GFR calc non Af Amer: 23 mL/min — ABNORMAL LOW (ref >60)
GFR calc non Af Amer: 23 mL/min — ABNORMAL LOW (ref >60)
Glucose, Bld: 127 mg/dL — ABNORMAL HIGH (ref 70–99)
Glucose, Bld: 129 mg/dL — ABNORMAL HIGH (ref 70–99)
Phosphorus: 4 mg/dL (ref 2.5–4.6)
Phosphorus: 6.1 mg/dL — ABNORMAL HIGH (ref 2.5–4.6)
Potassium: 4.6 mmol/L (ref 3.5–5.1)
Potassium: 5 mmol/L (ref 3.5–5.1)
Sodium: 137 mmol/L (ref 135–145)
Sodium: 137 mmol/L (ref 135–145)

## 2018-09-04 LAB — BLOOD GAS, ARTERIAL
Acid-Base Excess: 6.8 mmol/L — ABNORMAL HIGH (ref 0.0–2.0)
Acid-Base Excess: 6.6 mmol/L — ABNORMAL HIGH (ref 0.0–2.0)
Acid-Base Excess: 5 mmol/L — ABNORMAL HIGH (ref 0.0–2.0)
Acid-Base Excess: 2.2 mmol/L — ABNORMAL HIGH (ref 0.0–2.0)
BICARBONATE: 30.8 mmol/L — AB (ref 20.0–28.0)
Bicarbonate: 35.4 mmol/L — ABNORMAL HIGH (ref 20.0–28.0)
Bicarbonate: 34.7 mmol/L — ABNORMAL HIGH (ref 20.0–28.0)
Bicarbonate: 33.5 mmol/L — ABNORMAL HIGH (ref 20.0–28.0)
DRAWN BY: 36529
Drawn by: 365291
FIO2: 100
FIO2: 100
FIO2: 100
FIO2: 100
LHR: 30 {breaths}/min
MECHVT: 440 mL
MECHVT: 440 mL
Nitric Oxide: 5
O2 SAT: 92.6 %
O2 Saturation: 85.5 %
O2 Saturation: 86.4 %
O2 Saturation: 92.9 %
PEEP: 14 cmH2O
PEEP: 18 cmH2O
PEEP: 18 cmH2O
PEEP: 18 cmH2O
Patient temperature: 98.6
Patient temperature: 98.6
Patient temperature: 98.6
Patient temperature: 98.6
Pressure control: 30 cmH2O
RATE: 26 resp/min
RATE: 30 resp/min
RATE: 30 resp/min
VT: 440 mL
pCO2 arterial: 107 mmHg (ref 32.0–48.0)
pCO2 arterial: 98 mmHg (ref 32.0–48.0)
pCO2 arterial: 99.9 mmHg (ref 32.0–48.0)
pCO2 arterial: 96.6 mmHg (ref 32.0–48.0)
pH, Arterial: 7.146 — CL (ref 7.350–7.450)
pH, Arterial: 7.175 — CL (ref 7.350–7.450)
pH, Arterial: 7.151 — CL (ref 7.350–7.450)
pH, Arterial: 7.131 — CL (ref 7.350–7.450)
pO2, Arterial: 62.3 mmHg — ABNORMAL LOW (ref 83.0–108.0)
pO2, Arterial: 75.7 mmHg — ABNORMAL LOW (ref 83.0–108.0)
pO2, Arterial: 61.1 mmHg — ABNORMAL LOW (ref 83.0–108.0)
pO2, Arterial: 78 mmHg — ABNORMAL LOW (ref 83.0–108.0)

## 2018-09-04 LAB — RESPIRATORY PANEL BY PCR
Adenovirus: NOT DETECTED
Bordetella pertussis: NOT DETECTED
CORONAVIRUS OC43-RVPPCR: NOT DETECTED
Chlamydophila pneumoniae: NOT DETECTED
Coronavirus 229E: NOT DETECTED
Coronavirus HKU1: NOT DETECTED
Coronavirus NL63: NOT DETECTED
INFLUENZA B-RVPPCR: NOT DETECTED
Influenza A: NOT DETECTED
Metapneumovirus: NOT DETECTED
Mycoplasma pneumoniae: NOT DETECTED
PARAINFLUENZA VIRUS 4-RVPPCR: NOT DETECTED
Parainfluenza Virus 1: NOT DETECTED
Parainfluenza Virus 2: NOT DETECTED
Parainfluenza Virus 3: NOT DETECTED
RESPIRATORY SYNCYTIAL VIRUS-RVPPCR: NOT DETECTED
Rhinovirus / Enterovirus: NOT DETECTED

## 2018-09-04 LAB — POCT I-STAT 7, (LYTES, BLD GAS, ICA,H+H)
Acid-Base Excess: 11 mmol/L — ABNORMAL HIGH (ref 0.0–2.0)
Acid-Base Excess: 7 mmol/L — ABNORMAL HIGH (ref 0.0–2.0)
Acid-Base Excess: 1 mmol/L (ref 0.0–2.0)
BICARBONATE: 40.9 mmol/L — AB (ref 20.0–28.0)
Bicarbonate: 36.6 mmol/L — ABNORMAL HIGH (ref 20.0–28.0)
Bicarbonate: 32.8 mmol/L — ABNORMAL HIGH (ref 20.0–28.0)
CALCIUM ION: 1.17 mmol/L (ref 1.15–1.40)
Calcium, Ion: 1.11 mmol/L — ABNORMAL LOW (ref 1.15–1.40)
Calcium, Ion: 1.21 mmol/L (ref 1.15–1.40)
HCT: 42 % (ref 39.0–52.0)
HCT: 40 % (ref 39.0–52.0)
HEMATOCRIT: 43 % (ref 39.0–52.0)
HEMOGLOBIN: 14.6 g/dL (ref 13.0–17.0)
Hemoglobin: 14.3 g/dL (ref 13.0–17.0)
Hemoglobin: 13.6 g/dL (ref 13.0–17.0)
O2 SAT: 93 %
O2 Saturation: 91 %
O2 Saturation: 92 %
Patient temperature: 37.4
Patient temperature: 37
Patient temperature: 37
Potassium: 4.6 mmol/L (ref 3.5–5.1)
Potassium: 4.6 mmol/L (ref 3.5–5.1)
Potassium: 4.5 mmol/L (ref 3.5–5.1)
SODIUM: 137 mmol/L (ref 135–145)
SODIUM: 137 mmol/L (ref 135–145)
Sodium: 138 mmol/L (ref 135–145)
TCO2: 43 mmol/L — ABNORMAL HIGH (ref 22–32)
TCO2: 39 mmol/L — ABNORMAL HIGH (ref 22–32)
TCO2: 35 mmol/L — ABNORMAL HIGH (ref 22–32)
pCO2 arterial: 81.6 mmHg (ref 32.0–48.0)
pCO2 arterial: 75.7 mmHg (ref 32.0–48.0)
pCO2 arterial: 90.4 mmHg (ref 32.0–48.0)
pH, Arterial: 7.31 — ABNORMAL LOW (ref 7.350–7.450)
pH, Arterial: 7.293 — ABNORMAL LOW (ref 7.350–7.450)
pH, Arterial: 7.167 — CL (ref 7.350–7.450)
pO2, Arterial: 72 mmHg — ABNORMAL LOW (ref 83.0–108.0)
pO2, Arterial: 74 mmHg — ABNORMAL LOW (ref 83.0–108.0)
pO2, Arterial: 88 mmHg (ref 83.0–108.0)

## 2018-09-04 LAB — CBC
HEMATOCRIT: 44.3 % (ref 39.0–52.0)
Hemoglobin: 12.2 g/dL — ABNORMAL LOW (ref 13.0–17.0)
MCH: 25.4 pg — ABNORMAL LOW (ref 26.0–34.0)
MCHC: 27.5 g/dL — ABNORMAL LOW (ref 30.0–36.0)
MCV: 92.1 fL (ref 80.0–100.0)
Platelets: 136 10*3/uL — ABNORMAL LOW (ref 150–400)
RBC: 4.81 MIL/uL (ref 4.22–5.81)
RDW: 20.9 % — AB (ref 11.5–15.5)
WBC: 9.7 10*3/uL (ref 4.0–10.5)
nRBC: 0 % (ref 0.0–0.2)

## 2018-09-04 LAB — HEPARIN LEVEL (UNFRACTIONATED)
HEPARIN UNFRACTIONATED: 0.11 [IU]/mL — AB (ref 0.30–0.70)
Heparin Unfractionated: 0.31 IU/mL (ref 0.30–0.70)
Heparin Unfractionated: 0.38 IU/mL (ref 0.30–0.70)

## 2018-09-04 LAB — GLUCOSE, CAPILLARY
Glucose-Capillary: 155 mg/dL — ABNORMAL HIGH (ref 70–99)
Glucose-Capillary: 124 mg/dL — ABNORMAL HIGH (ref 70–99)
Glucose-Capillary: 112 mg/dL — ABNORMAL HIGH (ref 70–99)

## 2018-09-04 LAB — MAGNESIUM: Magnesium: 2.1 mg/dL (ref 1.7–2.4)

## 2018-09-04 MED ORDER — SODIUM BICARBONATE 8.4 % IV SOLN
100.0000 meq | Freq: Once | INTRAVENOUS | Status: AC
  Administered 2018-09-04: 100 meq via INTRAVENOUS
  Filled 2018-09-04: qty 100

## 2018-09-04 MED ORDER — HEPARIN BOLUS VIA INFUSION
4000.0000 [IU] | Freq: Once | INTRAVENOUS | Status: AC
  Administered 2018-09-04: 4000 [IU] via INTRAVENOUS
  Filled 2018-09-04: qty 4000

## 2018-09-04 MED ORDER — FENTANYL CITRATE (PF) 100 MCG/2ML IJ SOLN: 100.0000 ug | Freq: Once | INTRAMUSCULAR | Status: DC | PRN

## 2018-09-04 MED ORDER — MIDAZOLAM HCL 2 MG/2ML IJ SOLN: 2.0000 mg | Freq: Once | INTRAMUSCULAR | Status: DC | PRN

## 2018-09-04 MED ORDER — MIDAZOLAM 50MG/50ML (1MG/ML) PREMIX INFUSION
2.0000 mg/h | INTRAVENOUS | Status: DC
  Administered 2018-09-04: 5 mg/h via INTRAVENOUS
  Administered 2018-09-05: 3 mg/h via INTRAVENOUS
  Administered 2018-09-05: 2 mg/h via INTRAVENOUS
  Administered 2018-09-06: 3 mg/h via INTRAVENOUS
  Administered 2018-09-07: 5 mg/h via INTRAVENOUS
  Administered 2018-09-07: 4 mg/h via INTRAVENOUS
  Filled 2018-09-04 (×7): qty 50

## 2018-09-04 MED ORDER — SODIUM CHLORIDE 0.9 % IV SOLN
3.0000 ug/kg/min | INTRAVENOUS | Status: DC
  Administered 2018-09-04 – 2018-09-05 (×6): 3 ug/kg/min via INTRAVENOUS
  Administered 2018-09-06: 4 ug/kg/min via INTRAVENOUS
  Administered 2018-09-06: 3 ug/kg/min via INTRAVENOUS
  Administered 2018-09-06 (×3): 4 ug/kg/min via INTRAVENOUS
  Administered 2018-09-06: 3 ug/kg/min via INTRAVENOUS
  Administered 2018-09-07 – 2018-09-08 (×8): 4 ug/kg/min via INTRAVENOUS
  Filled 2018-09-04 (×20): qty 20

## 2018-09-04 MED ORDER — ALBUTEROL (5 MG/ML) CONTINUOUS INHALATION SOLN
20.0000 mg/h | INHALATION_SOLUTION | RESPIRATORY_TRACT | Status: DC
  Administered 2018-09-04: 20 mg/h via RESPIRATORY_TRACT
  Filled 2018-09-04: qty 20

## 2018-09-04 MED ORDER — METHYLPREDNISOLONE SODIUM SUCC 40 MG IJ SOLR
40.0000 mg | Freq: Four times a day (QID) | INTRAMUSCULAR | Status: DC
  Administered 2018-09-04 – 2018-09-08 (×15): 40 mg via INTRAVENOUS
  Filled 2018-09-04 (×15): qty 1

## 2018-09-04 MED ORDER — MIDAZOLAM BOLUS VIA INFUSION
2.0000 mg | INTRAVENOUS | Status: DC | PRN
  Filled 2018-09-04: qty 2

## 2018-09-04 MED ORDER — FENTANYL 2500MCG IN NS 250ML (10MCG/ML) PREMIX INFUSION
100.0000 ug/h | INTRAVENOUS | Status: DC
  Administered 2018-09-04: 100 ug/h via INTRAVENOUS
  Administered 2018-09-05: 150 ug/h via INTRAVENOUS
  Administered 2018-09-06: 175 ug/h via INTRAVENOUS
  Administered 2018-09-06: 150 ug/h via INTRAVENOUS
  Administered 2018-09-07: 200 ug/h via INTRAVENOUS
  Administered 2018-09-07: 250 ug/h via INTRAVENOUS
  Administered 2018-09-08: 225 ug/h via INTRAVENOUS
  Filled 2018-09-04 (×6): qty 250

## 2018-09-04 MED ORDER — FENTANYL BOLUS VIA INFUSION
50.0000 ug | INTRAVENOUS | Status: DC | PRN
  Filled 2018-09-04: qty 50

## 2018-09-04 MED ORDER — FENTANYL CITRATE (PF) 100 MCG/2ML IJ SOLN: 100.0000 ug | Freq: Once | INTRAMUSCULAR | Status: DC

## 2018-09-04 MED ORDER — CISATRACURIUM BOLUS VIA INFUSION
0.1000 mg/kg | Freq: Once | INTRAVENOUS | Status: AC
  Administered 2018-09-04: 21.8 mg via INTRAVENOUS
  Filled 2018-09-04: qty 22

## 2018-09-04 MED ORDER — SODIUM CHLORIDE 0.9 % IV SOLN
2.0000 g | Freq: Two times a day (BID) | INTRAVENOUS | Status: DC
  Administered 2018-09-04 – 2018-09-07 (×8): 2 g via INTRAVENOUS
  Filled 2018-09-04 (×9): qty 2

## 2018-09-04 MED ORDER — METHYLPREDNISOLONE SODIUM SUCC 125 MG IJ SOLR
125.0000 mg | Freq: Once | INTRAMUSCULAR | Status: AC
  Administered 2018-09-04: 125 mg via INTRAVENOUS
  Filled 2018-09-04: qty 2

## 2018-09-04 MED ORDER — ARTIFICIAL TEARS OPHTHALMIC OINT
1.0000 "application " | TOPICAL_OINTMENT | Freq: Three times a day (TID) | OPHTHALMIC | Status: DC
  Administered 2018-09-04 – 2018-09-08 (×12): 1 via OPHTHALMIC
  Filled 2018-09-04 (×3): qty 3.5

## 2018-09-04 MED ORDER — MIDAZOLAM HCL 2 MG/2ML IJ SOLN: 2.0000 mg | Freq: Once | INTRAMUSCULAR | Status: DC

## 2018-09-04 NOTE — Progress Notes (Signed)
  Echocardiogram 2D Echocardiogram has been performed.  Andre [name] 09/04/2018, 3:19 PM

## 2018-09-04 NOTE — Progress Notes (Signed)
Remaining Versed wasted with Eloise Harman RN. Approximately 20cc. IV pump issue caused air in the line and had to be changed out.

## 2018-09-04 NOTE — Care Plan (Signed)
Interval Pulmonary Progress Note  Briefly, Ms. Stroebel is a 38 year old male who was initially to [facility] admitted for cellulitis who developed acute hypercarbic and hypoxemic respiratory requiring BiPAP then had a ?aspiration event followed by cardiac arrest. He was then transferred to [facility]. Hospital course here has included refractory hypoxemia, septic shock from presumed respiratory source and renal failure requiring dialysis.  Today, I was alerted by staff patient developed worsening hypoxemia and hypercarbia and on exam. Vent settings were reviewed and adjusted with increased PEEP to improve lung recruitment and increased RR to improve minute ventilation with goal MV targeted 13-14. PO2 improved minimally so patient was started on paralytic protocol.   Based on exam, imaging and flow volume loops, his clinical picture suggests poor ventilation from obstruction secondary to severe tracheobronchomalacia, airway lung disease including obesity hypoventilation and obstructive sleep apnea and chest wall restriction related to obesity. Based on his chest imaging, he is not in ARDS and still has some component of volume overload but improved compared to admission. Patient's care was discussed and coordinated with multiple members including Heart Failure, Critical Care and ECMO coordinator and Pharmacy team.  and His echocardiogram was reviewed again with Heart Failure attending, Dr. Juanetta Snow, and in addition to noting diastolic heart failure, he felt patient had intraventricular septal flattening and increased IVC to suggest some component of pulmonary hypertension that may benefit from inhaled nitric oxide therapy. Nitric oxide trialed with improved PO2.  Plan Increase inhaled nitric oxide from 5 to 10 parts per billion. Recheck ABG at 1900. If improved, continue at current rate. If worse, reduced down to rate of 5. Continue CVV-HD for volume removal Scheduled bronchodilators  Scheduled  solumedrol Continue paralytic protocol Sedation with goal RASS -5  Family updated on patient's critical condition including death. Family wishes to remain aggressive with care.  If patient were to have refractory hypoxemia based on above measures, may consider ECMO at [facility]. Case was discussed with ECMO at [facility]. Staff expressed concerned regarding patient's co-morbid conditions including obesity. At the time of initial call, patient was not yet above treatment plan (paralytic, inhaled nitric oxide, steroids). Would be reasonable to re-visit this option if he were to deteriorate. [facility] [employer] [phone].     Critical Care Time: 180 minutes The patient is critically ill with multiple organ systems failure and requires high complexity decision making for assessment and support, frequent evaluation and titration of therapies, application of advanced monitoring technologies and extensive interpretation of multiple databases.   Critical Care Time devoted to patient care services described in this note is 180 Minutes. This time reflects time of care of this signee Dr. Mechele Collin. This critical care time does not reflect procedure time, or teaching time or supervisory time of PA/NP/Med student/Med Resident etc but could involve care discussion time.  Mechele Collin, M.D. [facility] Pulmonary/Critical Care Medicine Pager: [phone] After hours pager: [phone]

## 2018-09-04 NOTE — Progress Notes (Signed)
ANTICOAGULATION CONSULT NOTE  Pharmacy Consult for heparin  Indication: Rule-out PE  Allergies  Allergen Reactions  . Other Hives    Steroid, but cannot remember name.    Patient Measurements: Height: 5\' 6"  (167.6 cm) Weight: (!) 481 lb (218.2 kg) IBW/kg (Calculated) : 63.8 Heparin Dosing Weight: 122 kg   Vital Signs: Temp: 98.8 F (37.1 C) (02/21 1100) Temp Source: Oral (02/21 0754) BP: 91/37 (02/21 1059) Pulse Rate: 93 (02/21 0742)  Labs: Recent Labs    09/01/18 1304  09/02/18 0105  09/02/18 0430  09/02/18 1103  09/02/18 1522  09/02/18 2307  09/03/18 0337  09/03/18 1320 09/03/18 1704 09/03/18 2050 09/04/18 0400 09/04/18 0405 09/04/18 0945 09/04/18 1100  HGB  --    < >  --    < > 12.6*   < >  --    < >  --    < >  --    < > 13.0   < >  --   --   --  12.2* 14.3 13.6  --   HCT  --    < >  --    < > 43.6   < >  --    < >  --    < >  --    < > 45.9   < >  --   --   --  44.3 42.0 40.0  --   PLT  --   --   --   --  144*  --   --   --   --   --   --   --  132*  --   --   --   --  136*  --   --   --   APTT 30  --   --   --   --   --   --   --   --   --   --   --   --   --   --   --   --   --   --   --   --   LABPROT 15.4*  --  15.6*  --   --   --   --   --   --   --   --   --   --   --   --   --   --   --   --   --   --   INR 1.24  --  1.25  --   --   --   --   --   --   --   --   --   --   --   --   --   --   --   --   --   --   HEPARINUNFRC  --   --   --   --   --   --   --   --   --    < >  --   --   --    < >  --   --  <0.10* 0.11*  --   --  0.31  CREATININE 0.81   < >  --   --  1.47*  --  1.70*  --   --    < >  --   --  2.33*   < > 2.82* 3.19*  --  3.24*  --   --   --  TROPONINI 0.04*  --   --   --  0.58*   < > 0.39*  --  0.34*  --  0.25*  --   --   --   --   --   --   --   --   --   --    < > = values in this interval not displayed.    Estimated Creatinine Clearance: 54.9 mL/min (A) (by C-G formula based on SCr of 3.24 mg/dL (H)).   Assessment: 43 yoM s/p  code blue with PEA arrest initially,ventricular fibrillation and at one point this looks like torsades.   CT neg for mediastinal hematoma, head CT neg for bleed. CT chest was completed that was non-diagnostic of PE (insufficient contrast). Currently undergoing TTM for cardiac arrest - pt started rewarming ~2100 2/19.  Heparin level now therapeutic at 0.31, on 2300 units/hr. Now completely rewarmed. Hgb stable at 13.6, plt 136. No s/sx of bleeding. No infusion issues- heparin running into central line and level drawn off arterial line.   Goals: Heparin level: 0.3-0.7 units/ml Monitor pltc    Plan:  Increase heparin infusion to 2400 units/hr to keep in goal range F/u 6 hr heparin level Monitor daily HL, CBC, and for s/sx of bleeding  Sherron Monday, PharmD, BCCCP Clinical Pharmacist  Pager: [phone] Phone: [phone] 09/04/2018       12:19 PM

## 2018-09-04 NOTE — Procedures (Signed)
Admit: 08/17/2018 LOS: 7  67M with AKI, oliguric; hypoxic RF  Current CRRT Prescription: Start Date: 09/03/18 Catheter: Temp L IJ by CCM 09/03/18 BFR: 200 Pre Blood Pump: 500 4k DFR: 2000 4K Replacement Rate: 500 4K Goal UF: net neg 193mL/hr Anticoagulation: systemic heparin Clotting: not s ince initiation   S:  100% FIO2, PEEP 14; PaO2 72  K 4.6, P 4.0  Tolerating fluid removal  Continues on norepinephrine and vasopressin   O: 02/20 0701 - 02/21 0700 In: 3710.2 [P.O.:60; I.V.:2530.1; NG/GT:950; IV Piggyback:170.1] Out: 2924 [Urine:1321]  Filed Weights   09/02/18 0500 09/03/18 0500 09/04/18 0500  Weight: (!) 220.9 kg (!) 184.7 kg (!) 218.2 kg    Recent Labs  Lab 09/03/18 0337  09/03/18 1320 09/03/18 1704 09/04/18 0400 09/04/18 0405  NA 138   < > 140 139 137 137  K 4.1   < > 5.0 5.2* 4.6 4.6  CL 89*   < > 90* 89* 91*  --   CO2 37*   < > 42* 41* 38*  --   GLUCOSE 108*   < > 92 100* 127*  --   BUN 31*   < > 33* 35* 36*  --   CREATININE 2.33*   < > 2.82* 3.19* 3.24*  --   CALCIUM 8.5*   < > 8.4* 8.2* 8.2*  --   PHOS 5.4*  --   --  7.5* 4.0  --    < > = values in this interval not displayed.   Recent Labs  Lab 09/02/18 0430  09/03/18 0337  09/03/18 1102 09/04/18 0400 09/04/18 0405  WBC 7.3  --  8.7  --   --  9.7  --   HGB 12.6*   < > 13.0   < > 14.3 12.2* 14.3  HCT 43.6   < > 45.9   < > 42.0 44.3 42.0  MCV 90.1  --  91.6  --   --  92.1  --   PLT 144*  --  132*  --   --  136*  --    < > = values in this interval not displayed.    Scheduled Meds: . artificial tears  1 application Both Eyes Q8H  . budesonide (PULMICORT) nebulizer solution  0.5 mg Nebulization BID  . chlorhexidine  15 mL Mouth Rinse BID  . feeding supplement (PRO-STAT SUGAR FREE 64)  30 mL Per Tube BID  . feeding supplement (VITAL HIGH PROTEIN)  1,000 mL Per Tube Q24H  . fentaNYL (SUBLIMAZE) injection  100 mcg Intravenous Once  . ipratropium-albuterol  3 mL Nebulization Q4H  . mouth  rinse  15 mL Mouth Rinse q12n4p  . mouth rinse  15 mL Mouth Rinse 10 times per day  . nystatin cream   Topical BID  . pantoprazole (PROTONIX) IV  40 mg Intravenous QHS  . sodium chloride flush  3 mL Intravenous Q12H   Continuous Infusions: .  prismasol BGK 4/2.5 500 mL/hr at 09/03/18 2230  .  prismasol BGK 4/2.5 500 mL/hr at 09/03/18 2230  . sodium chloride Stopped (09/01/18 0757)  . cefTRIAXone (ROCEPHIN)  IV Stopped (09/03/18 1236)  . cisatracurium (NIMBEX) infusion Stopped (09/03/18 1004)  . fentaNYL infusion INTRAVENOUS 100 mcg/hr (09/04/18 0700)  . heparin 2,300 Units/hr (09/04/18 0700)  . midazolam (VERSED) 50mg  in NS 29mL (1mg /ml) premix infusion 2 mg/hr (09/04/18 0700)  . norepinephrine (LEVOPHED) Adult infusion 26 mcg/min (09/04/18 0700)  . prismasol BGK 4/2.5 2,000 mL/hr at 09/04/18  [place]  . vasopressin (PITRESSIN) infusion - *FOR SHOCK* 0.04 Units/min (09/04/18 0700)   PRN Meds:.sodium chloride, albuterol, fentaNYL, heparin, midazolam  ABG    Component Value Date/Time   PHART 7.310 (L) 09/04/2018 0405   PCO2ART 81.6 (HH) 09/04/2018 0405   PO2ART 72.0 (L) 09/04/2018 0405   HCO3 40.9 (H) 09/04/2018 0405   TCO2 43 (H) 09/04/2018 0405   O2SAT 91.0 09/04/2018 0405    A/P  1. Dialysis dependent AKI , nonoliguric, hypervolemic with profound hypoxic respiratory failure.  Etiology is cardiac arrest with contrast nephropathy.  Started CRRT 2/20 and tolerating some fluid removal.  Continue 4K solution, UF as able. 2. Hypoxic respiratory failure, VDRF, 100% FiO2 with high PEEP, Per CCM 3. Status post cardiac arrest with hypothermia protocol. 4. Shock on 2 pressors 5. Morbid obesity  Sabra Heck, MD [facility] pgr [phone]

## 2018-09-04 NOTE — Progress Notes (Signed)
   09/04/18 1840  Clinical Encounter Type  Visited With Patient and family together  Visit Type Initial;Spiritual support  Referral From Nurse  Consult/Referral To Chaplain  The chaplain responded to Pt.'s family call for spiritual care.  The chaplain introduced herself to the Pt. mother and other family members.  The Pt. mother asked for healing prayer for her son.  The chaplain offered F/U spiritual care as needed.

## 2018-09-04 NOTE — Progress Notes (Signed)
Patient began to show oxygen desaturation and tidal volumes decreasing. Paged RT. ABG critical values. Critical care team now at bedside. Family at the bedside and aware of situation.

## 2018-09-04 NOTE — Progress Notes (Signed)
eLink Physician-Brief Progress Note Patient Name: Andre [name] DOB: Oct 18, 1980 MRN: [id number]   Date of Service  09/04/2018  HPI/Events of Note  HD cath inserted overnight.  Pt started on CRRT.  Repeat ABG done this morning shows improvement in hercapnia - 7.31/81.6/72.  eICU Interventions  Continue to monitor.  Pt is still on PC mode.         Larinda Buttery 09/04/2018, 5:04 AM

## 2018-09-04 NOTE — Progress Notes (Signed)
ANTICOAGULATION CONSULT NOTE  Pharmacy Consult for heparin  Indication: Rule-out PE  Allergies  Allergen Reactions  . Other Hives    Steroid, but cannot remember name.    Patient Measurements: Height: 5\' 6"  (167.6 cm) Weight: (!) 481 lb (218.2 kg) IBW/kg (Calculated) : 63.8 Heparin Dosing Weight: 122 kg   Vital Signs: Temp: 98.6 F (37 C) (02/21 2200) Temp Source: Core (02/21 2000) BP: 115/38 (02/21 2200) Pulse Rate: 115 (02/21 1554)  Labs: Recent Labs    09/02/18 0105  09/02/18 0430  09/02/18 1103  09/02/18 1522  09/02/18 2307  09/03/18 0337  09/03/18 1704  09/04/18 0400 09/04/18 0405 09/04/18 0945 09/04/18 1100 09/04/18 1600 09/04/18 2000 09/04/18 2019  HGB  --    < > 12.6*   < >  --    < >  --    < >  --    < > 13.0   < >  --   --  12.2* 14.3 13.6  --   --   --  14.6  HCT  --    < > 43.6   < >  --    < >  --    < >  --    < > 45.9   < >  --   --  44.3 42.0 40.0  --   --   --  43.0  PLT  --   --  144*  --   --   --   --   --   --   --  132*  --   --   --  136*  --   --   --   --   --   --   LABPROT 15.6*  --   --   --   --   --   --   --   --   --   --   --   --   --   --   --   --   --   --   --   --   INR 1.25  --   --   --   --   --   --   --   --   --   --   --   --   --   --   --   --   --   --   --   --   HEPARINUNFRC  --   --   --   --   --   --   --    < >  --   --   --    < >  --    < > 0.11*  --   --  0.31  --  0.38  --   CREATININE  --    < > 1.47*  --  1.70*  --   --    < >  --   --  2.33*   < > 3.19*  --  3.24*  --   --   --  3.21*  --   --   TROPONINI  --   --  0.58*   < > 0.39*  --  0.34*  --  0.25*  --   --   --   --   --   --   --   --   --   --   --   --    < > =  values in this interval not displayed.    Estimated Creatinine Clearance: 55.4 mL/min (A) (by C-G formula based on SCr of 3.21 mg/dL (H)).   Assessment: 26 yoM s/p code blue with PEA arrest initially,ventricular fibrillation and at one point this looks like torsades.   CT neg for  mediastinal hematoma, head CT neg for bleed. CT chest was completed that was non-diagnostic of PE (insufficient contrast). Currently undergoing TTM for cardiac arrest - pt started rewarming ~2100 2/19.  Heparin level now therapeutic at 0.38, on 2450 units/hr. Now completely rewarmed. Hgb stable at 13.6, plt 136. No s/sx of bleeding. No infusion issues- heparin running into central line and level drawn off arterial line.   Goals: Heparin level: 0.3-0.7 units/ml Monitor pltc    Plan:  Continue heparin infusion 2400 units/hr Monitor daily HL, CBC, and for s/sx of bleeding  Leota Sauers Pharm.D. CPP, BCPS Clinical Pharmacist [phone] 09/04/2018 10:10 PM

## 2018-09-04 NOTE — Progress Notes (Signed)
ANTICOAGULATION CONSULT NOTE - Follow Up Consult  Pharmacy Consult for heparin Indication: r/o PE  Labs: Recent Labs    09/01/18 0532  09/01/18 1304  09/02/18 0105  09/02/18 0430  09/02/18 1103  09/02/18 1522  09/02/18 2307  09/03/18 0337  09/03/18 0930 09/03/18 1102  09/03/18 1318 09/03/18 1320 09/03/18 1704 09/03/18 2050 09/04/18 0400 09/04/18 0405  HGB 12.9*  --   --    < >  --    < > 12.6*   < >  --    < >  --    < >  --    < > 13.0   < > 14.3 14.3  --   --   --   --   --   --  14.3  HCT 46.4  --   --    < >  --    < > 43.6   < >  --    < >  --    < >  --    < > 45.9   < > 42.0 42.0  --   --   --   --   --   --  42.0  PLT 152  --   --   --   --   --  144*  --   --   --   --   --   --   --  132*  --   --   --   --   --   --   --   --   --   --   APTT  --   --  30  --   --   --   --   --   --   --   --   --   --   --   --   --   --   --   --   --   --   --   --   --   --   LABPROT  --   --  15.4*  --  15.6*  --   --   --   --   --   --   --   --   --   --   --   --   --   --   --   --   --   --   --   --   INR  --   --  1.24  --  1.25  --   --   --   --   --   --   --   --   --   --   --   --   --   --   --   --   --   --   --   --   HEPARINUNFRC  --   --   --   --   --   --   --   --   --   --   --    < >  --   --   --    < >  --   --   --  0.15*  --   --  <0.10* 0.11*  --   CREATININE 0.48*  --  0.81   < >  --   --  1.47*  --  1.70*  --   --    < >  --   --  2.33*   < >  --   --    < >  --  2.82* 3.19*  --  3.24*  --   TROPONINI  --    < > 0.04*  --   --   --  0.58*   < > 0.39*  --  0.34*  --  0.25*  --   --   --   --   --   --   --   --   --   --   --   --    < > = values in this interval not displayed.    Assessment: 38yo male remains subtherapeutic on heparin after rate change; now fully rewarmed; no gtt issues or signs of bleeding per RN.  Goal of Therapy:  Heparin level 0.3-0.7 units/ml   Plan:  Will give heparin bolus of 4000 units and increase heparin gtt by 4  units/kgABW/hr to 2300 units/hr and check level in 6 hours.    Vernard Gambles, PharmD, BCPS  09/04/2018,4:55 AM

## 2018-09-04 NOTE — Progress Notes (Signed)
NAME:  Andre [name], MRN:  [id number], DOB:  1980-11-23, LOS: 7 ADMISSION DATE:  08/31/2018, CONSULTATION DATE: 08/31/2018 REFERRING MD: Ashley Royalty, CHIEF COMPLAINT: Hypoxemic respiratory failure  Brief History   38 y/o M admitted with shortness of breath initially admitted to [facility] for hypercapnic respiratory failure, obesity hypoventilation.  History of respiratory failure, obstructive sleep apnea on CPAP. Transferred to [facility] for cardiac arrest for presumed PE (inadequate CTA) and requiring critical support.  Past Medical History  Iron deficient anemia, chronic lymphedema, prior cellulitis, heart failure, hypertension, obesity hypoventilation syndrome, obstructive sleep apnea.  Significant Hospital Events   2/13  Admit with SOB, NIPPV 2/17  PCCM consulted for hypercarbic resp failure 2/18  Remains hypercarbic, bicarb >50, diamox added  2/18: PEA arrest, did have VT/VF episodes during this event.  Etiology unclear initially, presumptively hypoxia related.  Was difficult intubation by anesthesia.  Time to return of spontaneous circulation recorded at 20 minutes.  Received several rounds of epinephrine, 4-5 defibrillation, 2 rounds of bicarbonate, and magnesium sulfate.  Post intubation chest x-ray worrisome for peritracheal hematoma.  CT imaging ordered to rule out cerebral bleed, as well as further CT chest imaging to rule out pulmonary emboli as well as paratracheal hematoma. 2/20: Initiated CRRT for volume removal   Consults:  PCCM 2/17   Procedures:  OETT 2/ 18 (DIFFICULT AIRWAY per anesthesia) >> Right IJ CVL 2/18 >> L IJ Vas Cath 2/20 Significant Diagnostic Tests:  ECHO 2/15 >> LVEF > 65%, mildly dilated LV, RV pressure could not be assessed, LA severely dilated, diastolic dysfunction CT head 2/18 >> negative  CT chest 2/18 >> non-diagnostic for PE, small L, moderate R pleural effusions with dependent consolidation, cardiomegaly, no evidence of mediastinal hematoma EEG 2/19 >>    ECHO 2/19 >> EF 55-60%. LE [place] 2/19 >> Limited exam. No evidence of DVT  Micro Data:  MRSA PCR 2/14 >>  negative BCx2 2/13 >> negative  Trach asp 2/20>>  Antimicrobials:  Diflucan 2/15 >16, 2/19 Unasyn 2/18 >> 2/19  Ceftriaxone 2/19 >>   Interim history/subjective:  Nephrology consulted. Failed lasix trial and was started on CRRT for volume removal due to worsening respiratory status  Objective   Blood pressure (!) 110/46, pulse 93, temperature 98.2 F (36.8 C), temperature source Oral, resp. rate (!) 26, height 5\' 6"  (1.676 m), weight (!) 218.2 kg, SpO2 91 %. CVP:  [14 mmHg-18 mmHg] 16 mmHg  Vent Mode: PCV FiO2 (%):  [90 %-100 %] 100 % Set Rate:  [18 bmp-26 bmp] 26 bmp Vt Set:  [440 mL] 440 mL PEEP:  [14 cmH20-16 cmH20] 14 cmH20 Plateau Pressure:  [32 cmH20-40 cmH20] 40 cmH20   Intake/Output Summary (Last 24 hours) at 09/04/2018 7035 Last data filed at 09/04/2018 0700 Gross per 24 hour  Intake 3457.67 ml  Output 2879 ml  Net 578.67 ml   Filed Weights   09/02/18 0500 09/03/18 0500 09/04/18 0500  Weight: (!) 220.9 kg (!) 184.7 kg (!) 218.2 kg   Physical Exam: General: MO critically ill male, intubated and sedated HENT: [place], AT, ETT in place Eyes: EOMI, no scleral icterus Respiratory: Anterior exam with bilateral rhonchi and wheezing Cardiovascular: RRR, -M/R/G, no JVD GI: BS+, soft, nontender Extremities:1+ lower extremity edema, warm to touch Neuro: Sedated Skin: Intact, no rashes or bruising Psych: Unable to assess GU: Foley in place Lines: R IJ CVC, L IJ Vas Cath  Resolved Hospital Problem list     Assessment & Plan:   Acute on chronic  hypoxemia and hypercarbic respiratory failure initially 2/2 decompensated OSA / OHS further c/b cardiopulmonary arrest. -DIFFICULT AIRWAY -Acutely hypoxic and cyanotic. Etiology unclear. Question cardiac event versus DVT although had been on Lovenox, also possible aspiration. Just prior to event was fully awake and oriented so  progressive hypercarbia seems unlikely. CTA chest unable to rule out PE -CXR 2/20 reviewed personally by me: Bilateral pleural effusions. Medial airspace disease. Widened medastinum P: Full vent support Titration of  PEEP / FiO2 for sats >90% ABG as needed CVVH for volume remove Heparin gtt for presumed PE. Will need repeat CTA when renal fx improves and clinically stable Continue Ceftriaxone F/u trach aspirate Adjust sedation for RASS Goal of -4 to -5 / vent synchrony  Duoneb Q6   Shock Presumed septic shock and sedation. Echo limited exam with normal EF and no WMA.  P: Continue levophed and vasopressin Hold home anti-hypertensive F/u cultures Continue antibiotics  Cardiac Arrest Diastolic heart failure, unknown chronicity -During arrest, initially PEA, did have episodes of VT/VF. S/p hypothermia protocol -Presumptively this was hypoxia.  However acute coronary event or thromboembolic event seem plausible.  There is also question of aspiration event.  Hypercarbia seems unlikely as he was fully awake and had been tolerating PCO2 in the 90s previously P: Cardiology following  Empiric heparin gtt for possible PE Hold home antihypertensives   Acute Encephalopathy -concern for hypoxic encephalopathy s/p prolonged time to ROSC  -CT Head negative P: Wean sedation for neuro exam  AKI  Likely secondary to ATN in setting of cardiac arrest and CIN  P: CRRT for volume removal Nephrology following. Appreciate the assistance Trend BMP / urinary output Replace electrolytes as indicated Avoid nephrotoxic agents, ensure adequate renal perfusion  Widening Peritracheal / Right Mediastinal Mass, ruled out -Hematoma ruled out on CT 2/18 P: Follow intermittent CXR   Best practice:  Diet: TF Pain/Anxiety/Delirium protocol (if indicated): Yes, RASS -4 VAP protocol (if indicated): ordered DVT prophylaxis: Heparin gtt GI prophylaxis: PPI Glucose control: n/a Mobility: Bedrest Code  Status: Full code Family Communication: Updated mom at bedside Disposition:  ICU   The patient is critically ill with multiple organ systems failure and requires high complexity decision making for assessment and support, frequent evaluation and titration of therapies, application of advanced monitoring technologies and extensive interpretation of multiple databases.   Critical Care Time devoted to patient care services described in this note is 50 Minutes. This time reflects time of care of this signee Dr. Mechele Collin. This critical care time does not reflect procedure time, or teaching time or supervisory time of PA/NP/Med student/Med Resident etc but could involve care discussion time.  Mechele Collin, M.D. [facility] Pulmonary/Critical Care Medicine Pager: [phone] After hours pager: [phone]

## 2018-09-05 DIAGNOSIS — J80 Acute respiratory distress syndrome: Secondary | ICD-10-CM

## 2018-09-05 LAB — RENAL FUNCTION PANEL
Albumin: 2.5 g/dL — ABNORMAL LOW (ref 3.5–5.0)
Albumin: 2.5 g/dL — ABNORMAL LOW (ref 3.5–5.0)
Anion gap: 11 (ref 5–15)
Anion gap: 8 (ref 5–15)
BUN: 29 mg/dL — ABNORMAL HIGH (ref 6–20)
BUN: 29 mg/dL — ABNORMAL HIGH (ref 6–20)
CO2: 30 mmol/L (ref 22–32)
CO2: 27 mmol/L (ref 22–32)
CREATININE: 2.9 mg/dL — AB (ref 0.61–1.24)
CREATININE: 2.66 mg/dL — AB (ref 0.61–1.24)
Calcium: 8.8 mg/dL — ABNORMAL LOW (ref 8.9–10.3)
Calcium: 8.9 mg/dL (ref 8.9–10.3)
Chloride: 95 mmol/L — ABNORMAL LOW (ref 98–111)
Chloride: 99 mmol/L (ref 98–111)
GFR calc Af Amer: 34 mL/min — ABNORMAL LOW (ref >60)
GFR calc non Af Amer: 26 mL/min — ABNORMAL LOW (ref >60)
GFR, EST AFRICAN AMERICAN: 30 mL/min — AB (ref >60)
GFR, EST NON AFRICAN AMERICAN: 29 mL/min — AB (ref >60)
Glucose, Bld: 159 mg/dL — ABNORMAL HIGH (ref 70–99)
Glucose, Bld: 143 mg/dL — ABNORMAL HIGH (ref 70–99)
Phosphorus: 4 mg/dL (ref 2.5–4.6)
Phosphorus: 4 mg/dL (ref 2.5–4.6)
Potassium: 5.1 mmol/L (ref 3.5–5.1)
Potassium: 5.4 mmol/L — ABNORMAL HIGH (ref 3.5–5.1)
Sodium: 136 mmol/L (ref 135–145)
Sodium: 134 mmol/L — ABNORMAL LOW (ref 135–145)

## 2018-09-05 LAB — POCT I-STAT 7, (LYTES, BLD GAS, ICA,H+H)
Acid-Base Excess: 4 mmol/L — ABNORMAL HIGH (ref 0.0–2.0)
Acid-base deficit: 3 mmol/L — ABNORMAL HIGH (ref 0.0–2.0)
BICARBONATE: 34.2 mmol/L — AB (ref 20.0–28.0)
Bicarbonate: 26 mmol/L (ref 20.0–28.0)
Calcium, Ion: 1.23 mmol/L (ref 1.15–1.40)
Calcium, Ion: 1.13 mmol/L — ABNORMAL LOW (ref 1.15–1.40)
HCT: 43 % (ref 39.0–52.0)
HCT: 36 % — ABNORMAL LOW (ref 39.0–52.0)
HEMOGLOBIN: 12.2 g/dL — AB (ref 13.0–17.0)
Hemoglobin: 14.6 g/dL (ref 13.0–17.0)
O2 Saturation: 90 %
O2 Saturation: 95 %
Patient temperature: 36.7
Patient temperature: 36.3
Potassium: 5.2 mmol/L — ABNORMAL HIGH (ref 3.5–5.1)
Potassium: 4.3 mmol/L (ref 3.5–5.1)
SODIUM: 139 mmol/L (ref 135–145)
Sodium: 136 mmol/L (ref 135–145)
TCO2: 37 mmol/L — AB (ref 22–32)
TCO2: 28 mmol/L (ref 22–32)
pCO2 arterial: 80.4 mmHg (ref 32.0–48.0)
pCO2 arterial: 60.9 mmHg — ABNORMAL HIGH (ref 32.0–48.0)
pH, Arterial: 7.235 — ABNORMAL LOW (ref 7.350–7.450)
pH, Arterial: 7.234 — ABNORMAL LOW (ref 7.350–7.450)
pO2, Arterial: 71 mmHg — ABNORMAL LOW (ref 83.0–108.0)
pO2, Arterial: 91 mmHg (ref 83.0–108.0)

## 2018-09-05 LAB — CBC
HCT: 46 % (ref 39.0–52.0)
Hemoglobin: 12.5 g/dL — ABNORMAL LOW (ref 13.0–17.0)
MCH: 26 pg (ref 26.0–34.0)
MCHC: 27.2 g/dL — ABNORMAL LOW (ref 30.0–36.0)
MCV: 95.6 fL (ref 80.0–100.0)
PLATELETS: 133 10*3/uL — AB (ref 150–400)
RBC: 4.81 MIL/uL (ref 4.22–5.81)
RDW: 21.5 % — ABNORMAL HIGH (ref 11.5–15.5)
WBC: 9 10*3/uL (ref 4.0–10.5)
nRBC: 0 % (ref 0.0–0.2)

## 2018-09-05 LAB — MAGNESIUM: Magnesium: 2.5 mg/dL — ABNORMAL HIGH (ref 1.7–2.4)

## 2018-09-05 LAB — CULTURE, RESPIRATORY W GRAM STAIN

## 2018-09-05 LAB — HEPARIN LEVEL (UNFRACTIONATED): Heparin Unfractionated: 0.35 IU/mL (ref 0.30–0.70)

## 2018-09-05 LAB — GLUCOSE, CAPILLARY
Glucose-Capillary: 133 mg/dL — ABNORMAL HIGH (ref 70–99)
Glucose-Capillary: 142 mg/dL — ABNORMAL HIGH (ref 70–99)

## 2018-09-05 LAB — CULTURE, RESPIRATORY

## 2018-09-05 LAB — COOXEMETRY PANEL
CARBOXYHEMOGLOBIN: 2.3 % — AB (ref 0.5–1.5)
METHEMOGLOBIN: 0.9 % (ref 0.0–1.5)
O2 Saturation: 90.2 %
Total hemoglobin: 12.8 g/dL (ref 12.0–16.0)

## 2018-09-05 MED ORDER — "THROMBI-PAD 3""X3"" EX PADS"
1.0000 | MEDICATED_PAD | Freq: Once | CUTANEOUS | Status: AC
  Administered 2018-09-06: 1 via TOPICAL
  Filled 2018-09-05: qty 1

## 2018-09-05 NOTE — Procedures (Signed)
Admit: 09-24-18 LOS: 8  20M with AKI, oliguric; profound hypoxic RF; s/p cardiac arrrest  Current CRRT Prescription: Start Date: 09/03/18 Catheter: Temp L IJ by CCM 09/03/18 BFR: 200 Pre Blood Pump: 500 4k DFR: 2000 4K Replacement Rate: 500 4K Goal UF: net neg 100-200 mL/hr Anticoagulation: systemic heparin Clotting: not since initiation    S:  100% FIO2, PEEP 18; PaO2 71; now on inhaled NO  K 5.2, P 4.0  Tolerating fluid removal  on norepinephrine and vasopressin; paralyzed  UOP dropping off   O: 02/21 0701 - 02/22 0700 In: 3293.4 [I.V.:2643.4; NG/GT:450; IV Piggyback:200] Out: 7946 [Urine:325; Emesis/NG output:2175]  Filed Weights   09/03/18 0500 09/04/18 0500 09/05/18 0700  Weight: (!) 184.7 kg (!) 218.2 kg (!) 219.7 kg    Recent Labs  Lab 09/04/18 0400  09/04/18 1600 09/04/18 2019 09/05/18 0357 09/05/18 0405  NA 137   < > 137 137 136 136  K 4.6   < > 5.0 4.5 5.1 5.2*  CL 91*  --  96*  --  95*  --   CO2 38*  --  34*  --  30  --   GLUCOSE 127*  --  129*  --  159*  --   BUN 36*  --  33*  --  29*  --   CREATININE 3.24*  --  3.21*  --  2.90*  --   CALCIUM 8.2*  --  8.4*  --  8.8*  --   PHOS 4.0  --  6.1*  --  4.0  --    < > = values in this interval not displayed.   Recent Labs  Lab 09/03/18 0337  09/04/18 0400  09/04/18 2019 09/05/18 0357 09/05/18 0405  WBC 8.7  --  9.7  --   --  9.0  --   HGB 13.0   < > 12.2*   < > 14.6 12.5* 14.6  HCT 45.9   < > 44.3   < > 43.0 46.0 43.0  MCV 91.6  --  92.1  --   --  95.6  --   PLT 132*  --  136*  --   --  133*  --    < > = values in this interval not displayed.    Scheduled Meds: . artificial tears  1 application Both Eyes Q8H  . budesonide (PULMICORT) nebulizer solution  0.5 mg Nebulization BID  . chlorhexidine  15 mL Mouth Rinse BID  . feeding supplement (PRO-STAT SUGAR FREE 64)  30 mL Per Tube BID  . feeding supplement (VITAL HIGH PROTEIN)  1,000 mL Per Tube Q24H  . fentaNYL (SUBLIMAZE) injection   100 mcg Intravenous Once  . ipratropium-albuterol  3 mL Nebulization Q4H  . mouth rinse  15 mL Mouth Rinse q12n4p  . mouth rinse  15 mL Mouth Rinse 10 times per day  . methylPREDNISolone (SOLU-MEDROL) injection  40 mg Intravenous Q6H  . midazolam  2 mg Intravenous Once  . nystatin cream   Topical BID  . pantoprazole (PROTONIX) IV  40 mg Intravenous QHS  . sodium chloride flush  3 mL Intravenous Q12H   Continuous Infusions: .  prismasol BGK 4/2.5 500 mL/hr at 09/05/18 0301  .  prismasol BGK 4/2.5 500 mL/hr at 09/04/18 2030  . sodium chloride 10 mL/hr at 09/05/18 0700  . albuterol 20 mg/hr (09/04/18 1449)  . ceFEPime (MAXIPIME) IV Stopped (09/04/18 2242)  . cisatracurium (NIMBEX) infusion 3 mcg/kg/min (09/05/18 0700)  . fentaNYL  infusion INTRAVENOUS 150 mcg/hr (09/05/18 0727)  . heparin 2,450 Units/hr (09/05/18 0700)  . midazolam 3 mg/hr (09/05/18 0700)  . norepinephrine (LEVOPHED) Adult infusion 21 mcg/min (09/05/18 0700)  . prismasol BGK 4/2.5 2,000 mL/hr at 09/05/18 0301  . vasopressin (PITRESSIN) infusion - *FOR SHOCK* 0.04 Units/min (09/05/18 0700)   PRN Meds:.sodium chloride, albuterol, fentaNYL, fentaNYL (SUBLIMAZE) injection, heparin, midazolam, midazolam  ABG    Component Value Date/Time   PHART 7.235 (L) 09/05/2018 0405   PCO2ART 80.4 (HH) 09/05/2018 0405   PO2ART 71.0 (L) 09/05/2018 0405   HCO3 34.2 (H) 09/05/2018 0405   TCO2 37 (H) 09/05/2018 0405   O2SAT 90.2 09/05/2018 0430    A/P  1. Dialysis dependent AKI , nonoliguric, hypervolemic with profound hypoxic respiratory failure.  Etiology is cardiac arrest with contrast nephropathy.  Started CRRT 2/20 and tolerating fluid removal.  Continue 4K solution, UF as able.  Inc flow settings give creeping up K.  If cont will need to change to a post 0K bath 2. Hypoxic respiratory failure, VDRF, 100% FiO2 with high PEEP, Per CCM + AHF; on NO, paralyzed; presumed PE 3. Status post cardiac arrest with hypothermia  protocol. 4. Shock on 2 pressors 5. Morbid obesity  Sabra Heck, MD [facility] pgr [phone]

## 2018-09-05 NOTE — Progress Notes (Signed)
NAME:  Andre [name], MRN:  [id number], DOB:  Jul 12, 1981, LOS: 8 ADMISSION DATE:  08/19/2018, CONSULTATION DATE: 08/31/2018 REFERRING MD: Ashley Royalty, CHIEF COMPLAINT: Hypoxemic respiratory failure  Brief History   38 y/o M admitted with shortness of breath initially admitted to [facility] for hypercapnic respiratory failure, obesity hypoventilation.  History of respiratory failure, obstructive sleep apnea on CPAP. Transferred to [facility] for cardiac arrest for presumed PE (inadequate CTA) and requiring critical support.  Past Medical History  Iron deficient anemia, chronic lymphedema, prior cellulitis, heart failure, hypertension, obesity hypoventilation syndrome, obstructive sleep apnea.  Significant Hospital Events   2/13  Admit with SOB, NIPPV 2/17  PCCM consulted for hypercarbic resp failure 2/18  Remains hypercarbic, bicarb >50, diamox added  2/18: PEA arrest, did have VT/VF episodes during this event.  Etiology unclear initially, presumptively hypoxia related.  Was difficult intubation by anesthesia.  Time to return of spontaneous circulation recorded at 20 minutes.  Received several rounds of epinephrine, 4-5 defibrillation, 2 rounds of bicarbonate, and magnesium sulfate.  Post intubation chest x-ray worrisome for peritracheal hematoma.  CT imaging ordered to rule out cerebral bleed, as well as further CT chest imaging to rule out pulmonary emboli as well as paratracheal hematoma. 2/20: Initiated CRRT for volume removal   Consults:  PCCM 2/17   Procedures:  OETT 2/ 18 (DIFFICULT AIRWAY per anesthesia) >> Right IJ CVL 2/18 >> L IJ Vas Cath 2/20>>>  Significant Diagnostic Tests:  ECHO 2/15 >> LVEF > 65%, mildly dilated LV, RV pressure could not be assessed, LA severely dilated, diastolic dysfunction CT head 2/18 >> negative  CT chest 2/18 >> non-diagnostic for PE, small L, moderate R pleural effusions with dependent consolidation, cardiomegaly, no evidence of mediastinal hematoma EEG 2/19 >>   ECHO 2/19 >> EF 55-60%. LE [place] 2/19 >> Limited exam. No evidence of DVT  Micro Data:  MRSA PCR 2/14 >>  negative BCx2 2/13 >> negative  Trach asp 2/20>>  Antimicrobials:  Diflucan 2/15 >16, 2/19 Unasyn 2/18 >> 2/19  Ceftriaxone 2/19 >>   Interim history/subjective:  No events overnight, paralyzed and sedated  Objective   Blood pressure (!) 113/35, pulse (!) 115, temperature 99.3 F (37.4 C), resp. rate (!) 30, height 5\' 6"  (1.676 m), weight (!) 219.7 kg, SpO2 96 %. CVP:  [13 mmHg-25 mmHg] 16 mmHg  Vent Mode: PRVC FiO2 (%):  [100 %] 100 % Set Rate:  [26 bmp-30 bmp] 30 bmp Vt Set:  [440 mL] 440 mL PEEP:  [14 cmH20-18 cmH20] 18 cmH20 Plateau Pressure:  [38 cmH20-45 cmH20] 42 cmH20   Intake/Output Summary (Last 24 hours) at 09/05/2018 1025 Last data filed at 09/05/2018 1000 Gross per 24 hour  Intake 3144.01 ml  Output 8258 ml  Net -5113.99 ml   Filed Weights   09/03/18 0500 09/04/18 0500 09/05/18 0700  Weight: (!) 184.7 kg (!) 218.2 kg (!) 219.7 kg   Physical Exam: General: Morbidly obese male, NAD, sedated and intubated HENT: [place]/AT, pupils non-reactive Eyes: No scleral icterus noted Respiratory: Diffuse crackles Cardiovascular: RRR, Nl S1/S2 and -M/R/G GI: Soft, NT, ND and +BS Extremities:1+ lower extremity edema, warm to touch Neuro: Sedated and paralyzed Skin: Intact Psych: Unable to assess due to paralytic and sedation GU: Foley in place Lines: R IJ CVC, L IJ Vas Cath  Resolved Hospital Problem list     Assessment & Plan:   Acute on chronic hypoxemia and hypercarbic respiratory failure initially 2/2 decompensated OSA / OHS further c/b cardiopulmonary arrest. -DIFFICULT  AIRWAY -Acutely hypoxic and cyanotic. Etiology unclear. Question cardiac event versus DVT although had been on Lovenox, also possible aspiration. Just prior to event was fully awake and oriented so progressive hypercarbia seems unlikely. CTA chest unable to rule out PE -CXR 2/20 reviewed  personally by me: Bilateral pleural effusions. Medial airspace disease. Widened medastinum CXR with pleural effusion but not enough to safely tap while on PEEP of 18 P: Continue full vent support Permissive hypercapnia, unable to increase minute ventilation farther at this point Adjust for acidosis with CRRT as able Titration of  PEEP / FiO2 for sats >90% ABG as needed CVVH for volume remove and pH adjustment Heparin gtt for presumed PE. Will need repeat CTA when renal fx improves and clinically stable Continue Ceftriaxone F/u trach aspirate Adjust sedation for RASS Goal of -4 to -5 / vent synchrony specially while paralyzed Duoneb Q6   Shock Presumed septic shock and sedation. Echo limited exam with normal EF and no WMA.  P: Levophed and vasopressin for BP support Hold home anti-hypertensive F/u cultures Continue antibiotics Nitric as ordered  Cardiac Arrest Diastolic heart failure, unknown chronicity -During arrest, initially PEA, did have episodes of VT/VF. S/p hypothermia protocol -Presumptively this was hypoxia.  However acute coronary event or thromboembolic event seem plausible.  There is also question of aspiration event.  Hypercarbia seems unlikely as he was fully awake and had been tolerating PCO2 in the 90s previously P: Cardiology following  Empiric heparin gtt for possible PE Hold home antihypertensives   Acute Encephalopathy -concern for hypoxic encephalopathy s/p prolonged time to ROSC  -CT Head negative P: Wean sedation for neuro exam  AKI  Likely secondary to ATN in setting of cardiac arrest and CIN  P: CRRT for volume removal Nephrology following. Appreciate the assistance Trend BMP / urinary output Replace electrolytes as indicated Avoid nephrotoxic agents, ensure adequate renal perfusion  Widening Peritracheal / Right Mediastinal Mass, ruled out -Hematoma ruled out on CT 2/18 P: Follow intermittent CXR   Best practice:  Diet:  TF Pain/Anxiety/Delirium protocol (if indicated): Yes, RASS -4 VAP protocol (if indicated): ordered DVT prophylaxis: Heparin gtt GI prophylaxis: PPI Glucose control: n/a Mobility: Bedrest Code Status: Full code Family Communication: Updated mom at bedside Disposition:  ICU   Family updated bedside, informed that patient is critically ill and that we are very concerned about his ability to survive current admission but will see once more volume is removed  The patient is critically ill with multiple organ systems failure and requires high complexity decision making for assessment and support, frequent evaluation and titration of therapies, application of advanced monitoring technologies and extensive interpretation of multiple databases.   Critical Care Time devoted to patient care services described in this note is  32  Minutes. This time reflects time of care of this signee Dr Koren Bound. This critical care time does not reflect procedure time, or teaching time or supervisory time of PA/NP/Med student/Med Resident etc but could involve care discussion time.  Alyson Reedy, M.D. [facility] Pulmonary/Critical Care Medicine. Pager: [phone]. After hours pager: [phone].

## 2018-09-05 NOTE — Progress Notes (Signed)
ANTICOAGULATION CONSULT NOTE  Pharmacy Consult for heparin  Indication: Rule-out PE  Allergies  Allergen Reactions  . Other Hives    Steroid, but cannot remember name.    Patient Measurements: Height: 5\' 6"  (167.6 cm) Weight: (!) 484 lb 6.4 oz (219.7 kg) IBW/kg (Calculated) : 63.8 Heparin Dosing Weight: 122 kg   Vital Signs: Temp: 97.7 F (36.5 C) (02/22 0800) BP: 114/40 (02/22 0800)  Labs: Recent Labs    09/02/18 1103  09/02/18 1522  09/02/18 2307  09/03/18 0337  09/04/18 0400  09/04/18 1100 09/04/18 1600 09/04/18 2000 09/04/18 2019 09/05/18 0357 09/05/18 0405  HGB  --    < >  --    < >  --    < > 13.0   < > 12.2*   < >  --   --   --  14.6 12.5* 14.6  HCT  --    < >  --    < >  --    < > 45.9   < > 44.3   < >  --   --   --  43.0 46.0 43.0  PLT  --   --   --   --   --   --  132*  --  136*  --   --   --   --   --  133*  --   HEPARINUNFRC  --   --   --    < >  --   --   --    < > 0.11*  --  0.31  --  0.38  --  0.35  --   CREATININE 1.70*  --   --    < >  --   --  2.33*   < > 3.24*  --   --  3.21*  --   --  2.90*  --   TROPONINI 0.39*  --  0.34*  --  0.25*  --   --   --   --   --   --   --   --   --   --   --    < > = values in this interval not displayed.    Estimated Creatinine Clearance: 61.6 mL/min (A) (by C-G formula based on SCr of 2.9 mg/dL (H)).   Assessment: 62 yoM s/p code blue with PEA arrest initially,ventricular fibrillation and at one point this looks like torsades.   CT neg for mediastinal hematoma, head CT neg for bleed. CT chest was completed that was non-diagnostic of PE (insufficient contrast). Currently undergoing TTM for cardiac arrest - pt started rewarming ~2100 2/19.  Heparin level remains therapeutic at 0.35, on 2450 units/hr. Now completely rewarmed. Hgb stable at 12.5, plt 133. No s/sx of bleeding reported by nurse. No infusion issues- heparin running into central line and level drawn off arterial line.   Goals: Heparin level: 0.3-0.7  units/ml Monitor pltc    Plan:  Continue heparin infusion at 2450 units/hr Monitor daily HL, CBC, and for s/sx of bleeding   Marcelino Freestone, PharmD PGY2 Cardiology Pharmacy Resident Please check AMION for all Pharmacist numbers by unit 09/05/2018 8:12 AM

## 2018-09-06 ENCOUNTER — Inpatient Hospital Stay (HOSPITAL_COMMUNITY): Payer: Medicare Other

## 2018-09-06 LAB — POCT I-STAT 7, (LYTES, BLD GAS, ICA,H+H)
Acid-base deficit: 2 mmol/L (ref 0.0–2.0)
Bicarbonate: 30.6 mmol/L — ABNORMAL HIGH (ref 20.0–28.0)
Bicarbonate: 29.1 mmol/L — ABNORMAL HIGH (ref 20.0–28.0)
Calcium, Ion: 1.34 mmol/L (ref 1.15–1.40)
Calcium, Ion: 1.39 mmol/L (ref 1.15–1.40)
HCT: 43 % (ref 39.0–52.0)
HCT: 43 % (ref 39.0–52.0)
HEMOGLOBIN: 14.6 g/dL (ref 13.0–17.0)
Hemoglobin: 14.6 g/dL (ref 13.0–17.0)
O2 Saturation: 94 %
O2 Saturation: 93 %
POTASSIUM: 5.4 mmol/L — AB (ref 3.5–5.1)
Patient temperature: 36.5
Patient temperature: 36.3
Potassium: 5.6 mmol/L — ABNORMAL HIGH (ref 3.5–5.1)
Sodium: 134 mmol/L — ABNORMAL LOW (ref 135–145)
Sodium: 135 mmol/L (ref 135–145)
TCO2: 33 mmol/L — ABNORMAL HIGH (ref 22–32)
TCO2: 32 mmol/L (ref 22–32)
pCO2 arterial: 76.6 mmHg (ref 32.0–48.0)
pCO2 arterial: 79.1 mmHg (ref 32.0–48.0)
pH, Arterial: 7.207 — ABNORMAL LOW (ref 7.350–7.450)
pH, Arterial: 7.17 — CL (ref 7.350–7.450)
pO2, Arterial: 88 mmHg (ref 83.0–108.0)
pO2, Arterial: 86 mmHg (ref 83.0–108.0)

## 2018-09-06 LAB — CBC
HCT: 46 % (ref 39.0–52.0)
Hemoglobin: 12.6 g/dL — ABNORMAL LOW (ref 13.0–17.0)
MCH: 26.3 pg (ref 26.0–34.0)
MCHC: 27.4 g/dL — AB (ref 30.0–36.0)
MCV: 96 fL (ref 80.0–100.0)
Platelets: 131 10*3/uL — ABNORMAL LOW (ref 150–400)
RBC: 4.79 MIL/uL (ref 4.22–5.81)
RDW: 21.6 % — ABNORMAL HIGH (ref 11.5–15.5)
WBC: 9.8 10*3/uL (ref 4.0–10.5)
nRBC: 0 % (ref 0.0–0.2)

## 2018-09-06 LAB — RENAL FUNCTION PANEL
ALBUMIN: 2.7 g/dL — AB (ref 3.5–5.0)
ANION GAP: 9 (ref 5–15)
Albumin: 2.6 g/dL — ABNORMAL LOW (ref 3.5–5.0)
Albumin: 2.7 g/dL — ABNORMAL LOW (ref 3.5–5.0)
Anion gap: 10 (ref 5–15)
Anion gap: 8 (ref 5–15)
BUN: 29 mg/dL — ABNORMAL HIGH (ref 6–20)
BUN: 31 mg/dL — ABNORMAL HIGH (ref 6–20)
BUN: 33 mg/dL — ABNORMAL HIGH (ref 6–20)
CO2: 26 mmol/L (ref 22–32)
CO2: 25 mmol/L (ref 22–32)
CO2: 26 mmol/L (ref 22–32)
Calcium: 9.3 mg/dL (ref 8.9–10.3)
Calcium: 9.3 mg/dL (ref 8.9–10.3)
Calcium: 9.6 mg/dL (ref 8.9–10.3)
Chloride: 98 mmol/L (ref 98–111)
Chloride: 101 mmol/L (ref 98–111)
Chloride: 100 mmol/L (ref 98–111)
Creatinine, Ser: 2.56 mg/dL — ABNORMAL HIGH (ref 0.61–1.24)
Creatinine, Ser: 2.39 mg/dL — ABNORMAL HIGH (ref 0.61–1.24)
Creatinine, Ser: 2.54 mg/dL — ABNORMAL HIGH (ref 0.61–1.24)
GFR calc Af Amer: 35 mL/min — ABNORMAL LOW (ref >60)
GFR calc Af Amer: 38 mL/min — ABNORMAL LOW (ref >60)
GFR calc Af Amer: 36 mL/min — ABNORMAL LOW (ref >60)
GFR calc non Af Amer: 31 mL/min — ABNORMAL LOW (ref >60)
GFR calc non Af Amer: 33 mL/min — ABNORMAL LOW (ref >60)
GFR calc non Af Amer: 31 mL/min — ABNORMAL LOW (ref >60)
GLUCOSE: 130 mg/dL — AB (ref 70–99)
Glucose, Bld: 128 mg/dL — ABNORMAL HIGH (ref 70–99)
Glucose, Bld: 124 mg/dL — ABNORMAL HIGH (ref 70–99)
PHOSPHORUS: 4.1 mg/dL (ref 2.5–4.6)
POTASSIUM: 5.2 mmol/L — AB (ref 3.5–5.1)
Phosphorus: 4.1 mg/dL (ref 2.5–4.6)
Phosphorus: 3.8 mg/dL (ref 2.5–4.6)
Potassium: 5.4 mmol/L — ABNORMAL HIGH (ref 3.5–5.1)
Potassium: 5.1 mmol/L (ref 3.5–5.1)
Sodium: 134 mmol/L — ABNORMAL LOW (ref 135–145)
Sodium: 134 mmol/L — ABNORMAL LOW (ref 135–145)
Sodium: 135 mmol/L (ref 135–145)

## 2018-09-06 LAB — GLUCOSE, CAPILLARY
Glucose-Capillary: 120 mg/dL — ABNORMAL HIGH (ref 70–99)
Glucose-Capillary: 133 mg/dL — ABNORMAL HIGH (ref 70–99)

## 2018-09-06 LAB — MAGNESIUM: Magnesium: 2.5 mg/dL — ABNORMAL HIGH (ref 1.7–2.4)

## 2018-09-06 LAB — COOXEMETRY PANEL
Carboxyhemoglobin: 1.7 % — ABNORMAL HIGH (ref 0.5–1.5)
Methemoglobin: 1 % (ref 0.0–1.5)
O2 Saturation: 89.9 %
Total hemoglobin: 12.8 g/dL (ref 12.0–16.0)

## 2018-09-06 LAB — HEPARIN LEVEL (UNFRACTIONATED): Heparin Unfractionated: 0.3 IU/mL (ref 0.30–0.70)

## 2018-09-06 MED ORDER — "THROMBI-PAD 3""X3"" EX PADS"
1.0000 | MEDICATED_PAD | Freq: Once | CUTANEOUS | Status: AC
  Administered 2018-09-06: 1 via TOPICAL
  Filled 2018-09-06: qty 1

## 2018-09-06 MED ORDER — PRISMASOL BGK 0/2.5 32-2.5 MEQ/L REPLACEMENT SOLN
Status: DC
  Administered 2018-09-06 – 2018-09-08 (×5): via INTRAVENOUS_CENTRAL
  Filled 2018-09-06 (×8): qty 5000

## 2018-09-06 MED ORDER — HEPARIN SODIUM (PORCINE) 1000 UNIT/ML DIALYSIS
1000.0000 [IU] | INTRAMUSCULAR | Status: DC | PRN
  Filled 2018-09-06: qty 6

## 2018-09-06 NOTE — Progress Notes (Signed)
NAME:  Andre [name], MRN:  [id number], DOB:  1981-02-16, LOS: 9 ADMISSION DATE:  09/06/2018, CONSULTATION DATE: 08/31/2018 REFERRING MD: Ashley Royalty, CHIEF COMPLAINT: Hypoxemic respiratory failure  Brief History   38 y/o M admitted with shortness of breath initially admitted to [facility] for hypercapnic respiratory failure, obesity hypoventilation.  History of respiratory failure, obstructive sleep apnea on CPAP. Transferred to [facility] for cardiac arrest for presumed PE (inadequate CTA) and requiring critical support.  Past Medical History  Iron deficient anemia, chronic lymphedema, prior cellulitis, heart failure, hypertension, obesity hypoventilation syndrome, obstructive sleep apnea.  Significant Hospital Events   2/13  Admit with SOB, NIPPV 2/17  PCCM consulted for hypercarbic resp failure 2/18  Remains hypercarbic, bicarb >50, diamox added  2/18: PEA arrest, did have VT/VF episodes during this event.  Etiology unclear initially, presumptively hypoxia related.  Was difficult intubation by anesthesia.  Time to return of spontaneous circulation recorded at 20 minutes.  Received several rounds of epinephrine, 4-5 defibrillation, 2 rounds of bicarbonate, and magnesium sulfate.  Post intubation chest x-ray worrisome for peritracheal hematoma.  CT imaging ordered to rule out cerebral bleed, as well as further CT chest imaging to rule out pulmonary emboli as well as paratracheal hematoma. 2/20: Initiated CRRT for volume removal   Consults:  PCCM 2/17   Procedures:  OETT 2/ 18 (DIFFICULT AIRWAY per anesthesia) >> Right IJ CVL 2/18 >> L IJ Vas Cath 2/20>>>  Significant Diagnostic Tests:  ECHO 2/15 >> LVEF > 65%, mildly dilated LV, RV pressure could not be assessed, LA severely dilated, diastolic dysfunction CT head 2/18 >> negative  CT chest 2/18 >> non-diagnostic for PE, small L, moderate R pleural effusions with dependent consolidation, cardiomegaly, no evidence of mediastinal hematoma EEG 2/19 >>   ECHO 2/19 >> EF 55-60%. LE [place] 2/19 >> Limited exam. No evidence of DVT  Micro Data:  MRSA PCR 2/14 >>  negative BCx2 2/13 >> negative  Trach asp 2/20>>  Antimicrobials:  Diflucan 2/15 >16, 2/19 Unasyn 2/18 >> 2/19  Ceftriaxone 2/19 >>   Interim history/subjective:  No events overnight, remains sedated and paralyzed  Objective   Blood pressure (!) 93/38, pulse 92, temperature (!) 97.3 F (36.3 C), resp. rate (!) 30, height 5\' 6"  (1.676 m), weight (!) 213.6 kg, SpO2 95 %. CVP:  [14 mmHg-20 mmHg] 20 mmHg  Vent Mode: PRVC FiO2 (%):  [100 %] 100 % Set Rate:  [30 bmp] 30 bmp Vt Set:  [440 mL] 440 mL PEEP:  [18 cmH20] 18 cmH20 Plateau Pressure:  [36 cmH20-43 cmH20] 43 cmH20   Intake/Output Summary (Last 24 hours) at 09/06/2018 1223 Last data filed at 09/06/2018 1200 Gross per 24 hour  Intake 2943.77 ml  Output 7701 ml  Net -4757.23 ml   Filed Weights   09/04/18 0500 09/05/18 0700 09/06/18 0700  Weight: (!) 218.2 kg (!) 219.7 kg (!) 213.6 kg   Physical Exam: General: Morbidly obese male, NAD, sedated and intubated HENT: [place]/AT, pupils none reactive Respiratory: Coarse BS diffusely Cardiovascular: RRR, Nl S1/S2 and -M/R/G GI: Soft, NT, ND and +BS Extremities:1+ lower extremity edema, warm to touch Neuro: Sedated and paralyzed Skin: Intact Psych: Unable to assess due to paralytic and sedation GU: Foley in place Lines: R IJ CVC, L IJ Vas Cath  Resolved Hospital Problem list     Assessment & Plan:   Acute on chronic hypoxemia and hypercarbic respiratory failure initially 2/2 decompensated OSA / OHS further c/b cardiopulmonary arrest. - DIFFICULT AIRWAY - Acutely  hypoxic and cyanotic. Etiology unclear. Question cardiac event versus DVT although had been on Lovenox, also possible aspiration. Just prior to event was fully awake and oriented so progressive hypercarbia seems unlikely. CTA chest unable to rule out PE -CXR 2/20 reviewed personally by me: Bilateral pleural  effusions. Medial airspace disease. Widened medastinum CXR with pleural effusion but not enough to safely tap while on PEEP of 18 P: Continue full vent support Increase RR to 35 F/U ABG Permissive hypercapnia, unable to increase minute ventilation farther at this point Adjust for acidosis with CRRT as able Titration of  PEEP / FiO2 for sats >90% ABG as needed CVVH for volume remove and pH adjustment Heparin gtt for presumed PE. Will need repeat CTA when renal fx improves and clinically stable Continue Ceftriaxone F/u trach aspirate Adjust sedation for RASS Goal of -4 to -5 / vent synchrony specially while paralyzed Duoneb Q6   Shock Presumed septic shock and sedation. Echo limited exam with normal EF and no WMA.  P: Levophed and vasopressin for BP support Hold home anti-hypertensive F/u cultures Continue antibiotics Nitric as ordered  Cardiac Arrest Diastolic heart failure, unknown chronicity -During arrest, initially PEA, did have episodes of VT/VF. S/p hypothermia protocol -Presumptively this was hypoxia.  However acute coronary event or thromboembolic event seem plausible.  There is also question of aspiration event.  Hypercarbia seems unlikely as he was fully awake and had been tolerating PCO2 in the 90s previously P: Cardiology following  Empiric heparin gtt for possible PE Hold home antihypertensives   Acute Encephalopathy -concern for hypoxic encephalopathy s/p prolonged time to ROSC  -CT Head negative P: Wean sedation for neuro exam  AKI  Likely secondary to ATN in setting of cardiac arrest and CIN  P: CRRT for volume removal Nephrology following. Appreciate the assistance Trend BMP / urinary output Replace electrolytes as indicated Avoid nephrotoxic agents, ensure adequate renal perfusion  Widening Peritracheal / Right Mediastinal Mass, ruled out -Hematoma ruled out on CT 2/18 P: Follow intermittent CXR   Best practice:  Diet: TF Pain/Anxiety/Delirium  protocol (if indicated): Yes, RASS -4 VAP protocol (if indicated): ordered DVT prophylaxis: Heparin gtt GI prophylaxis: PPI Glucose control: n/a Mobility: Bedrest Code Status: Full code Family Communication: Updated mom at bedside Disposition:  ICU   Family updated bedside, will likely need trach when FiO2 and PEEP are more compatible  The patient is critically ill with multiple organ systems failure and requires high complexity decision making for assessment and support, frequent evaluation and titration of therapies, application of advanced monitoring technologies and extensive interpretation of multiple databases.   Critical Care Time devoted to patient care services described in this note is  32  Minutes. This time reflects time of care of this signee Dr Koren Bound. This critical care time does not reflect procedure time, or teaching time or supervisory time of PA/NP/Med student/Med Resident etc but could involve care discussion time.  Alyson Reedy, M.D. [facility] Pulmonary/Critical Care Medicine. Pager: [phone]. After hours pager: [phone].

## 2018-09-06 NOTE — Progress Notes (Signed)
ANTICOAGULATION CONSULT NOTE  Pharmacy Consult for heparin  Indication: Rule-out PE  Allergies  Allergen Reactions  . Other Hives    Steroid, but cannot remember name.    Patient Measurements: Height: 5\' 6"  (167.6 cm) Weight: (!) 471 lb (213.6 kg) IBW/kg (Calculated) : 63.8 Heparin Dosing Weight: 122 kg   Vital Signs: Temp: 96.8 F (36 C) (02/23 0700) BP: 124/57 (02/23 0735) Pulse Rate: 90 (02/23 0735)  Labs: Recent Labs    09/04/18 0400  09/04/18 2000  09/05/18 0357  09/05/18 1518 09/05/18 1546 09/06/18 0348 09/06/18 0356  HGB 12.2*   < >  --    < > 12.5*   < >  --  12.2* 12.6* 14.6  HCT 44.3   < >  --    < > 46.0   < >  --  36.0* 46.0 43.0  PLT 136*  --   --   --  133*  --   --   --  131*  --   HEPARINUNFRC 0.11*   < > 0.38  --  0.35  --   --   --  0.30  --   CREATININE 3.24*   < >  --   --  2.90*  --  2.66*  --  2.56*  --    < > = values in this interval not displayed.    Estimated Creatinine Clearance: 68.5 mL/min (A) (by C-G formula based on SCr of 2.56 mg/dL (H)).   Assessment: 39 yoM s/p code blue with PEA arrest initially,ventricular fibrillation and at one point this looks like torsades.   CT neg for mediastinal hematoma, head CT neg for bleed. CT chest was completed that was non-diagnostic of PE (insufficient contrast). Currently undergoing TTM for cardiac arrest - pt started rewarming ~2100 2/19.  Heparin level remains therapeutic at 0.3, on 2450 units/hr. Now completely rewarmed. Hgb stable at 14.6, plt 131. Nurse notes bleeding from HD cath site, thrombi pad applied. No other s/sx of bleeding reported. No infusion issues.   Goals: Heparin level: 0.3-0.7 units/ml Monitor pltc    Plan:  - Continue heparin infusion at 2450 units/hr - Continue to monitor bleeding from HD site, cannot decrease heparin drip as already at the lower end of therapeutic range - Monitor daily HL, CBC, and for s/sx of bleeding   Marcelino Freestone, PharmD PGY2 Cardiology  Pharmacy Resident Please check AMION for all Pharmacist numbers by unit 09/06/2018 8:50 AM

## 2018-09-06 NOTE — Progress Notes (Signed)
RN notified respiratory team of latest ABG results.

## 2018-09-06 NOTE — Procedures (Signed)
Admit: 08/18/2018 LOS: 9  68M with AKI, oliguric; profound hypoxic RF; s/p cardiac arrrest  Current CRRT Prescription: Start Date: 09/03/18 Catheter: Temp L IJ by CCM 09/03/18 BFR: 200 Pre Blood Pump: 1000 4k DFR: 2500 4K Replacement Rate: 750 4K Goal UF: net neg 100-200 mL/hr Anticoagulation: systemic heparin Clotting: not since initiation    S:  Tol large UF yesterday, 5.1L negative  Remains 100% FIO2, PEEP 18; on inhaled NO  K 5.6, P 2.5  Still req norepinephrine and vasopressin; paralyzed  Anuric   O: 02/22 0701 - 02/23 0700 In: 3041.5 [I.V.:2721.5; NG/GT:120; IV Piggyback:200] Out: 8183 [Urine:75; Emesis/NG output:475]  Filed Weights   09/04/18 0500 09/05/18 0700 09/06/18 0700  Weight: (!) 218.2 kg (!) 219.7 kg (!) 213.6 kg    Recent Labs  Lab 09/05/18 0357  09/05/18 1518 09/05/18 1546 09/06/18 0348 09/06/18 0356  NA 136   < > 134* 139 134* 134*  K 5.1   < > 5.4* 4.3 5.4* 5.6*  CL 95*  --  99  --  98  --   CO2 30  --  27  --  26  --   GLUCOSE 159*  --  143*  --  130*  --   BUN 29*  --  29*  --  29*  --   CREATININE 2.90*  --  2.66*  --  2.56*  --   CALCIUM 8.8*  --  8.9  --  9.3  --   PHOS 4.0  --  4.0  --  4.1  --    < > = values in this interval not displayed.   Recent Labs  Lab 09/04/18 0400  09/05/18 0357  09/05/18 1546 09/06/18 0348 09/06/18 0356  WBC 9.7  --  9.0  --   --  9.8  --   HGB 12.2*   < > 12.5*   < > 12.2* 12.6* 14.6  HCT 44.3   < > 46.0   < > 36.0* 46.0 43.0  MCV 92.1  --  95.6  --   --  96.0  --   PLT 136*  --  133*  --   --  131*  --    < > = values in this interval not displayed.    Scheduled Meds: . artificial tears  1 application Both Eyes Q8H  . budesonide (PULMICORT) nebulizer solution  0.5 mg Nebulization BID  . chlorhexidine  15 mL Mouth Rinse BID  . feeding supplement (PRO-STAT SUGAR FREE 64)  30 mL Per Tube BID  . feeding supplement (VITAL HIGH PROTEIN)  1,000 mL Per Tube Q24H  . fentaNYL (SUBLIMAZE)  injection  100 mcg Intravenous Once  . ipratropium-albuterol  3 mL Nebulization Q4H  . mouth rinse  15 mL Mouth Rinse q12n4p  . mouth rinse  15 mL Mouth Rinse 10 times per day  . methylPREDNISolone (SOLU-MEDROL) injection  40 mg Intravenous Q6H  . midazolam  2 mg Intravenous Once  . nystatin cream   Topical BID  . pantoprazole (PROTONIX) IV  40 mg Intravenous QHS  . sodium chloride flush  3 mL Intravenous Q12H   Continuous Infusions: .  prismasol BGK 4/2.5 1,000 mL/hr at 09/06/18 0716  .  prismasol BGK 4/2.5 750 mL/hr at 09/06/18 0415  . sodium chloride Stopped (09/05/18 0914)  . albuterol 20 mg/hr (09/04/18 1449)  . ceFEPime (MAXIPIME) IV Stopped (09/05/18 2142)  . cisatracurium (NIMBEX) infusion 3 mcg/kg/min (09/06/18 0700)  . fentaNYL infusion INTRAVENOUS  175 mcg/hr (09/06/18 0700)  . heparin 2,450 Units/hr (09/06/18 0700)  . midazolam 2.5 mg/hr (09/06/18 0700)  . norepinephrine (LEVOPHED) Adult infusion 11 mcg/min (09/06/18 0700)  . prismasol BGK 0/2.5    . prismasol BGK 4/2.5 2,500 mL/hr at 09/06/18 0715  . vasopressin (PITRESSIN) infusion - *FOR SHOCK* 0.04 Units/min (09/06/18 0700)   PRN Meds:.sodium chloride, albuterol, fentaNYL, fentaNYL (SUBLIMAZE) injection, heparin, heparin, midazolam, midazolam  ABG    Component Value Date/Time   PHART 7.207 (L) 09/06/2018 0356   PCO2ART 76.6 (HH) 09/06/2018 0356   PO2ART 88.0 09/06/2018 0356   HCO3 30.6 (H) 09/06/2018 0356   TCO2 33 (H) 09/06/2018 0356   ACIDBASEDEF 3.0 (H) 09/05/2018 1546   O2SAT 89.9 09/06/2018 0402    A/P  1. Dialysis dependent AKI , nonoliguric, hypervolemic with profound hypoxic respiratory failure.  Etiology is cardiac arrest with contrast nephropathy.  Started CRRT 2/20 and tolerating fluid removal.  Hyperkalemic will change post replacement ot 0K today.   2. Hypoxic respiratory failure, VDRF, 100% FiO2 with high PEEP, Per CCM + AHF; on NO, paralyzed; presumed PE on hep gtt 3. Status post cardiac arrest  with hypothermia protocol. 4. Shock on 2 pressors 5. Morbid obesity 6. Hyperkalemia, as above  Sabra Heck, MD [employer] pgr [phone]

## 2018-09-07 ENCOUNTER — Inpatient Hospital Stay (HOSPITAL_COMMUNITY): Payer: Medicare Other

## 2018-09-07 DIAGNOSIS — Z7189 Other specified counseling: Secondary | ICD-10-CM

## 2018-09-07 DIAGNOSIS — Z515 Encounter for palliative care: Secondary | ICD-10-CM

## 2018-09-07 LAB — CBC
HCT: 47 % (ref 39.0–52.0)
HEMOGLOBIN: 12.8 g/dL — AB (ref 13.0–17.0)
MCH: 26.1 pg (ref 26.0–34.0)
MCHC: 27.2 g/dL — AB (ref 30.0–36.0)
MCV: 95.7 fL (ref 80.0–100.0)
Platelets: 135 10*3/uL — ABNORMAL LOW (ref 150–400)
RBC: 4.91 MIL/uL (ref 4.22–5.81)
RDW: 21.4 % — ABNORMAL HIGH (ref 11.5–15.5)
WBC: 11 10*3/uL — ABNORMAL HIGH (ref 4.0–10.5)
nRBC: 0 % (ref 0.0–0.2)

## 2018-09-07 LAB — HEPARIN LEVEL (UNFRACTIONATED): Heparin Unfractionated: 0.37 IU/mL (ref 0.30–0.70)

## 2018-09-07 LAB — RENAL FUNCTION PANEL
Albumin: 2.8 g/dL — ABNORMAL LOW (ref 3.5–5.0)
Albumin: 2.9 g/dL — ABNORMAL LOW (ref 3.5–5.0)
Anion gap: 6 (ref 5–15)
Anion gap: 6 (ref 5–15)
BUN: 34 mg/dL — ABNORMAL HIGH (ref 6–20)
BUN: 38 mg/dL — ABNORMAL HIGH (ref 6–20)
CO2: 26 mmol/L (ref 22–32)
CO2: 27 mmol/L (ref 22–32)
Calcium: 9.7 mg/dL (ref 8.9–10.3)
Calcium: 10 mg/dL (ref 8.9–10.3)
Chloride: 101 mmol/L (ref 98–111)
Chloride: 102 mmol/L (ref 98–111)
Creatinine, Ser: 2.43 mg/dL — ABNORMAL HIGH (ref 0.61–1.24)
Creatinine, Ser: 2.62 mg/dL — ABNORMAL HIGH (ref 0.61–1.24)
GFR calc Af Amer: 38 mL/min — ABNORMAL LOW (ref >60)
GFR calc Af Amer: 34 mL/min — ABNORMAL LOW (ref >60)
GFR calc non Af Amer: 32 mL/min — ABNORMAL LOW (ref >60)
GFR calc non Af Amer: 30 mL/min — ABNORMAL LOW (ref >60)
Glucose, Bld: 134 mg/dL — ABNORMAL HIGH (ref 70–99)
Glucose, Bld: 136 mg/dL — ABNORMAL HIGH (ref 70–99)
Phosphorus: 4.1 mg/dL (ref 2.5–4.6)
Phosphorus: 5.3 mg/dL — ABNORMAL HIGH (ref 2.5–4.6)
Potassium: 5 mmol/L (ref 3.5–5.1)
Potassium: 5.3 mmol/L — ABNORMAL HIGH (ref 3.5–5.1)
Sodium: 133 mmol/L — ABNORMAL LOW (ref 135–145)
Sodium: 135 mmol/L (ref 135–145)

## 2018-09-07 LAB — COOXEMETRY PANEL
Carboxyhemoglobin: 1.3 % (ref 0.5–1.5)
Methemoglobin: 1.6 % — ABNORMAL HIGH (ref 0.0–1.5)
O2 Saturation: 90 %
TOTAL HEMOGLOBIN: 13.2 g/dL (ref 12.0–16.0)

## 2018-09-07 LAB — GLUCOSE, CAPILLARY
GLUCOSE-CAPILLARY: 131 mg/dL — AB (ref 70–99)
Glucose-Capillary: 122 mg/dL — ABNORMAL HIGH (ref 70–99)
Glucose-Capillary: 125 mg/dL — ABNORMAL HIGH (ref 70–99)
Glucose-Capillary: 150 mg/dL — ABNORMAL HIGH (ref 70–99)
Glucose-Capillary: 149 mg/dL — ABNORMAL HIGH (ref 70–99)

## 2018-09-07 LAB — POCT I-STAT 7, (LYTES, BLD GAS, ICA,H+H)
Acid-base deficit: 2 mmol/L (ref 0.0–2.0)
Bicarbonate: 28.6 mmol/L — ABNORMAL HIGH (ref 20.0–28.0)
Calcium, Ion: 1.46 mmol/L — ABNORMAL HIGH (ref 1.15–1.40)
HCT: 44 % (ref 39.0–52.0)
Hemoglobin: 15 g/dL (ref 13.0–17.0)
O2 SAT: 93 %
Patient temperature: 36.2
Potassium: 5 mmol/L (ref 3.5–5.1)
Sodium: 133 mmol/L — ABNORMAL LOW (ref 135–145)
TCO2: 31 mmol/L (ref 22–32)
pCO2 arterial: 72.4 mmHg (ref 32.0–48.0)
pH, Arterial: 7.2 — ABNORMAL LOW (ref 7.350–7.450)
pO2, Arterial: 82 mmHg — ABNORMAL LOW (ref 83.0–108.0)

## 2018-09-07 LAB — MAGNESIUM: Magnesium: 2.6 mg/dL — ABNORMAL HIGH (ref 1.7–2.4)

## 2018-09-07 MED ORDER — PRO-STAT SUGAR FREE PO LIQD
30.0000 mL | Freq: Three times a day (TID) | ORAL | Status: DC
  Administered 2018-09-07 – 2018-09-08 (×3): 30 mL
  Filled 2018-09-07 (×3): qty 30

## 2018-09-07 MED ORDER — VITAL HIGH PROTEIN PO LIQD: 1000.0000 mL | ORAL | Status: DC

## 2018-09-07 MED ORDER — METOCLOPRAMIDE HCL 5 MG/ML IJ SOLN
5.0000 mg | Freq: Four times a day (QID) | INTRAMUSCULAR | Status: AC
  Administered 2018-09-07 – 2018-09-08 (×4): 5 mg via INTRAVENOUS
  Filled 2018-09-07 (×4): qty 2

## 2018-09-07 MED ORDER — B COMPLEX-C PO TABS
1.0000 | ORAL_TABLET | Freq: Every day | ORAL | Status: DC
  Filled 2018-09-07 (×2): qty 1

## 2018-09-07 MED ORDER — VITAL AF 1.2 CAL PO LIQD
1000.0000 mL | ORAL | Status: DC
  Administered 2018-09-07: 1000 mL

## 2018-09-07 NOTE — Progress Notes (Signed)
Called by bedside RN, patient is desaturating and pressure limiting.  Attempted PCV but patient desaturated rapidly and MV dropped by 13% making calculated pH to 7.1 but stopped the pressure limiting.  That was obviously not acceptable and patient was returned back to [facility].  We obtained another CXR and there was no PTX noted.  ETT is in a good position.  I discussed with the family, relayed concerns that the patient's lungs are suffered additional damage and that I am unsure if patient will be able to survive current condition.  Also that given nitric and full vent support that moving the patient is unsafe and that we are at the max of what we can provide here.  Called [facility] for the option of ECMO but due to severity of illness the patient was not a candidate or safe to travel.  That was all relayed to the family and code status was discussed.  They are unable to make decision on code status at this time.  PCCM will continue to manage.  The hope is to get to the point where O2 demand and PEEP are improved enough for [place] to be able to unparalyze and recheck mental status.  Have not called neurology yet as patient remains paralyzed for respiratory concerns and unable to get MRI given that patient is again unstable to move on nitric, CRRT and pressor support.  Will continue aggressive support for now.  The patient is critically ill with multiple organ systems failure and requires high complexity decision making for assessment and support, frequent evaluation and titration of therapies, application of advanced monitoring technologies and extensive interpretation of multiple databases.   Critical Care Time devoted to patient care services described in this note is  55  Minutes. This time reflects time of care of this signee Dr Koren Bound. This critical care time does not reflect procedure time, or teaching time or supervisory time of PA/NP/Med student/Med Resident etc but could involve care discussion  time.  Alyson Reedy, M.D. [facility] Pulmonary/Critical Care Medicine. Pager: [phone]. After hours pager: [phone].

## 2018-09-07 NOTE — Progress Notes (Addendum)
NAME:  Andre [name], MRN:  [id number], DOB:  18-Sep-1980, LOS: 10 ADMISSION DATE:  08/25/2018, CONSULTATION DATE: 08/31/2018 REFERRING MD: Ashley Royalty, CHIEF COMPLAINT: Hypoxemic respiratory failure  Brief History   38 y/o M admitted with shortness of breath initially admitted to [facility] for hypercapnic respiratory failure, obesity hypoventilation.  History of respiratory failure, obstructive sleep apnea on CPAP. Transferred to [facility] for cardiac arrest for presumed PE (inadequate CTA) and requiring critical support.  Past Medical History  Iron deficient anemia, chronic lymphedema, prior cellulitis, heart failure, hypertension, obesity hypoventilation syndrome, obstructive sleep apnea.  Significant Hospital Events   2/13  Admit with SOB, NIPPV 2/17  PCCM consulted for hypercarbic resp failure 2/18  Remains hypercarbic, bicarb >50, diamox added  2/18 PEA arrest, did have VT/VF episodes during this event.  Etiology unclear initially, presumptively hypoxia related.  Was difficult intubation by anesthesia.  Time to return of spontaneous circulation recorded at 20 minutes.  Received several rounds of epinephrine, 4-5 defibrillation, 2 rounds of bicarbonate, and magnesium sulfate.  Post intubation chest x-ray worrisome for peritracheal hematoma.  CT imaging ordered to rule out cerebral bleed, as well as further CT chest imaging to rule out pulmonary emboli as well as paratracheal hematoma. 2/20 Initiated CRRT for volume removal  2/24 Paralyzed, on pressors.  Has not tolerated TF.  On 18PEEP/Nitric 10  Consults:  PCCM 2/17   Procedures:  OETT 2/ 18 (DIFFICULT AIRWAY per anesthesia) >> Right IJ CVL 2/18 >> L IJ Vas Cath 2/20 >>  Significant Diagnostic Tests:  ECHO 2/15 >> LVEF > 65%, mildly dilated LV, RV pressure could not be assessed, LA severely dilated, diastolic dysfunction CT head 2/18 >> negative  CT chest 2/18 >> non-diagnostic for PE, small L, moderate R pleural effusions with dependent  consolidation, cardiomegaly, no evidence of mediastinal hematoma EEG 2/19 >> consistent with sedated state  ECHO 2/19 >> EF 55-60%. LE [place] 2/19 >> Limited exam. No evidence of DVT  Micro Data:  MRSA PCR 2/14 >>  negative BCx2 2/13 >> negative  RVP 2/21 >> negative Trach asp 2/20 >> enterobacter >> S-cefepime  Antimicrobials:  Diflucan 2/15 >16, 2/19 Unasyn 2/18 >> 2/19  Ceftriaxone 2/19 >> 2/21 Cefepime 2/21 >>   Interim history/subjective:  RN reports pt remains on paralytics, vasopressors.  Concern for possible hematoma vs abrasion on back of neck.  Pt did not tolerate TF due to high residuals.   Objective   Blood pressure (!) 106/46, pulse 96, temperature (!) 97.5 F (36.4 C), resp. rate (!) 35, height 5\' 6"  (1.676 m), weight (!) 211.8 kg, SpO2 95 %. CVP:  [15 mmHg-26 mmHg] 18 mmHg  Vent Mode: PRVC FiO2 (%):  [100 %] 100 % Set Rate:  [30 bmp-35 bmp] 35 bmp Vt Set:  [440 mL] 440 mL PEEP:  [18 cmH20] 18 cmH20 Plateau Pressure:  [40 cmH20-47 cmH20] 46 cmH20   Intake/Output Summary (Last 24 hours) at 09/07/2018 0805 Last data filed at 09/07/2018 0700 Gross per 24 hour  Intake 3048.75 ml  Output 7313 ml  Net -4264.25 ml   Filed Weights   09/05/18 0700 09/06/18 0700 09/07/18 0733  Weight: (!) 219.7 kg (!) 213.6 kg (!) 211.8 kg   Physical Exam: General: morbidly obese male, critically ill appearing on vent sedated HEENT: MM pink/moist, ETT, unable to assess JVD, bilateral IJ lines Neuro: sedate / paralyzed, BIS ~27-30 CV: s1s2 rrr, no m/r/g PULM: even/non-labored, lungs bilaterally coarse with wheezing  EB:RAXE, bsx4 hypo-active  Extremities: warm/dry, 1+  BLE edema  Skin: small abrasion on posterior neck   Resolved Hospital Problem list     Assessment & Plan:   Acute on chronic hypoxemia and hypercarbic respiratory failure initially 2/2 decompensated OSA / OHS further c/b cardiopulmonary arrest. - DIFFICULT AIRWAY - Acutely hypoxic and cyanotic. Etiology unclear.  Question cardiac event versus DVT although had been on Lovenox, also possible aspiration. Just prior to event was fully awake and oriented so progressive hypercarbia seems unlikely. CTA chest unable to rule out PE, LE negative for DVT but limited study. P: PRVC 7cc/kg Allow permissive hypercapnia > unable to increased MV further  Nitric oxide, wean as able  Continue heparin gtt for empiric PE coverage  ABX as above, plan for 10 days therapy.  Stop date in place PRN ABG  RASS Goal: -4 to -5 with ventilator synchrony  Duoneb Q6, Pulmicort  Solumedrol 40 mg IV Q6 Will need early trach once PEEP/FiO2 stabilizes enough for procedure  Shock Presumed septic shock and sedation. Echo limited exam with normal EF and no WMA.  P: Continue levophed + vasopressin for MAP>65 Hold home antihypertensives  ABX as above   Cardiac Arrest Diastolic heart failure, unknown chronicity -During arrest, initially PEA, did have episodes of VT/VF. S/p hypothermia protocol -Presumptively this was hypoxia.  However acute coronary event or thromboembolic event seem plausible.  There is also question of aspiration event.  Hypercarbia seems unlikely as he was fully awake and had been tolerating PCO2 in the 90s previously P: Continue empiric heparin  ICU monitoring  CVVHD for volume removal   Acute Encephalopathy -concern for hypoxic encephalopathy s/p prolonged time to ROSC  -CT Head negative P: Respiratory status remains barrier for WUA  Minimize duration of sedation / paralytics as able  AKI  Likely secondary to ATN in setting of cardiac arrest and CIN  P: CVVHD for volume removal Nephrology following, appreciate input  Trend BMP / urinary output Replace electrolytes as indicated Avoid nephrotoxic agents, ensure adequate renal perfusion  Widening Peritracheal / Right Mediastinal Mass, ruled out -Hematoma ruled out on CT 2/18 P: Follow CXR intermittently   At Risk Malnutrition  -did not tolerate TF  due to high residual  P: Reglan  IV Q6 for 4 doses  Begin trickle TF late this afternoon, no increase   Best practice:  Diet: TF Pain/Anxiety/Delirium protocol (if indicated): Yes, RASS -4 VAP protocol (if indicated): ordered DVT prophylaxis: Heparin gtt GI prophylaxis: PPI Glucose control: n/a Mobility: Bedrest Code Status: Full code Family Communication: Mother, girlfriend updated at bedside 2/24  Disposition:  ICU   CC Time: 35 minutes   Canary Brim, NP-C [facility] Pgr: [phone] or if no answer [phone] 09/07/2018, 8:05 AM  Attending Note:  38 year old with super morbid obesity who presents in acute on chronic hypercarbic respiratory failure that suffered a cardiac arrest.  Patient developed aspiration and severe ARDS on FiO2 of 100% and PEEP of 18 cmH2O.  No events overnight.  On exam, remains unresponsive and not following commands.  I reviewed CXR myself, RLL remains atelectatic with ETT is in place.  Will continue full support for now.  ABG and adjust vent accordingly.  Start reglan.  Start trickle feed and a post pyloric panda.  No TPN.  Continue steroids and paralytics for now.  Discussed with PCCM-NP.  The patient is critically ill with multiple organ systems failure and requires high complexity decision making for assessment and support, frequent evaluation and titration of  therapies, application of advanced monitoring technologies and extensive interpretation of multiple databases.   Critical Care Time devoted to patient care services described in this note is  33  Minutes. This time reflects time of care of this signee Dr Koren Bound. This critical care time does not reflect procedure time, or teaching time or supervisory time of PA/NP/Med student/Med Resident etc but could involve care discussion time.  Alyson Reedy, M.D. [facility]/Critical Care Medicine. Pager: [phone]. After hours pager: [phone].

## 2018-09-07 NOTE — Procedures (Addendum)
Cortrak  Person Inserting Tube:  Osa Craver, RD Tube Type:  Cortrak - 43 inches Tube Location:  Left nare Initial Placement:  Postpyloric Secured by: Bridle Technique Used to Measure Tube Placement:  Documented cm marking at nare/ corner of mouth Cortrak Secured At:  100 cm Procedure Comments:  Cortrak Tube Team Note:  Consult received to place a Cortrak feeding tube.   X-ray is required, abdominal x-ray has been ordered by the Cortrak team. Please confirm tube placement before using the Cortrak tube.   If the tube becomes dislodged please keep the tube and contact the Cortrak team at [email or web address] (password [email or web address]) for replacement.  If after hours and replacement cannot be delayed, place a NG tube and confirm placement with an abdominal x-ray.   Romelle Starcher MS, RD, LDN, CNSC [phone] Pager  [phone] Weekend/On-Call Pager

## 2018-09-07 NOTE — Progress Notes (Signed)
ANTICOAGULATION CONSULT NOTE  Pharmacy Consult for heparin  Indication: Rule-out PE  Allergies  Allergen Reactions  . Other Hives    Steroid, but cannot remember name.    Patient Measurements: Height: 5\' 6"  (167.6 cm) Weight: (!) 467 lb (211.8 kg) IBW/kg (Calculated) : 63.8 Heparin Dosing Weight: 122 kg   Vital Signs: Temp: 97.5 F (36.4 C) (02/24 0715) Temp Source: Core (02/24 0800) BP: 102/42 (02/24 0756) Pulse Rate: 93 (02/24 0800)  Labs: Recent Labs    09/05/18 0357  09/06/18 0348  09/06/18 1247 09/06/18 1256 09/06/18 2040 09/07/18 0355 09/07/18 0455  HGB 12.5*   < > 12.6*   < >  --  14.6  --  12.8* 15.0  HCT 46.0   < > 46.0   < >  --  43.0  --  47.0 44.0  PLT 133*  --  131*  --   --   --   --  135*  --   HEPARINUNFRC 0.35  --  0.30  --   --   --   --  0.37  --   CREATININE 2.90*   < > 2.56*  --  2.39*  --  2.54* 2.43*  --    < > = values in this interval not displayed.    Estimated Creatinine Clearance: 71.7 mL/min (A) (by C-G formula based on SCr of 2.43 mg/dL (H)).   Assessment: 38 yoM s/p code blue with PEA arrest initially,ventricular fibrillation and at one point this looks like torsades.   CT neg for mediastinal hematoma, head CT neg for bleed. CT chest was completed that was non-diagnostic of PE (insufficient contrast). Currently undergoing TTM for cardiac arrest - pt started rewarming ~2100 2/19.  Heparin level remains therapeutic at 0.37, on 2450 units/hr. Now completely rewarmed. Hgb normalized, plt stable at 135. No s/sx of bleeding reported by nurse. No infusion issues- heparin running into central line and level drawn off arterial line.   Goals: Heparin level: 0.3-0.7 units/ml Monitor pltc    Plan:  Increase heparin infusion slightly to 2500 units/hr to increase heparin level given high oxygen requirements  Monitor daily HL, CBC, and for s/sx of bleeding   Vinnie Level, PharmD., BCPS Clinical Pharmacist Clinical phone for 09/07/18  until 3:30pm: [phone] If after 3:30pm, please refer to [facility] for unit-specific pharmacist

## 2018-09-07 NOTE — Progress Notes (Addendum)
Nutrition Follow-up  DOCUMENTATION CODES:   Morbid obesity  INTERVENTION:   -Vital AF 1.2 @ 20 ml/hr via OGT -Increase by 10 ml Q6 hours to goal rate of 60 ml/hr (1440 ml) -30 ml Prostat TID  -Add B complex-vitamin C  Provides: 2028 kcals, 153 grams protein, 1168 ml free water. Meets 104% kcal needs and 100% of protein needs.   NUTRITION DIAGNOSIS:   Inadequate oral intake related to inability to eat as evidenced by NPO status.  Ongoing  GOAL:   Provide needs based on ASPEN/SCCM guidelines  Not met- addressed via TF  MONITOR:   Diet advancement, Vent status, Skin, TF tolerance, Weight trends, Labs, I & O's  REASON FOR ASSESSMENT:   Ventilator    ASSESSMENT:   Patient with PMH significant for morbid obesity, chronic lymphedema, panniculitis, CHF, OSA requiring CPAP, and HTN. Presented to [facility] (2/15) with acute exacerbation of CHF and cellulitis. Transferred to [facility] (2/18) after PEA arrest from plausible coronary or thromboembolic event.    2/18- PEA arrest- intubated 2/20- start CRRT- volume removal   Pt remains on paralytic, requiring pressors. Per RN pt unable to tolerate TF since starting on 2/19.  Suctioned 2 L of tan colored liquid on 2/22. Post-pyloric Cortrak placed today. Will begin feeds slow and titrate as tolerated.   Pt without BM since admit, no regimen in place. RN made aware. CRRT- UF goal at 200 ml/hour, UOP decreased today.   Weight noted to decrease from 220.9 kg on 2/19 to 211.8 kg today. Estimated needs adjusted.   Patient remains intubated on ventilator support MV: 15.4 L/min Temp (24hrs), Avg:97 F (36.1 C), Min:95.4 F (35.2 C), Max:98.2 F (36.8 C) BP: 110/45 MAP: 63  I/O: -12.2 L since admit UOP: 25 ml x 24 hrs OGT: 200 ml x 24 hrs    Medications reviewed and include: solumedrol, reglan once, nimbex, levophed, vasopressin Labs reviewed: Na 133 (L) calcium ionized 1.46 (L) Mg 2.6 (L)   Diet Order:   Diet Order            Diet -  low sodium heart healthy              EDUCATION NEEDS:   Not appropriate for education at this time  Skin:  Skin Assessment: Skin Integrity Issues: Skin Integrity Issues:: Other (Comment) Other: cracking cellulitis bilateral heels, MASD- groin/scrotum  Last BM:  2/17- constipated?  Height:   Ht Readings from Last 1 Encounters:  09/02/18 '5\' 6"'$  (1.676 m)    Weight:   Wt Readings from Last 1 Encounters:  09/07/18 (!) 211.8 kg    Ideal Body Weight:  64.5 kg  BMI:  Body mass index is 75.38 kg/m.  Estimated Nutritional Needs:   Kcal:  1943 kcal  Protein:  129-162 grams  Fluid:  1000 ml + UOP   Mariana Single RD, LDN Clinical Nutrition Pager # [phone]

## 2018-09-07 NOTE — Progress Notes (Signed)
Patient ID: BEAUFORD ROERIG, male   DOB: 03-26-1981, 38 y.o.   MRN: [id number] S: No new events overnight and tolerated CVVHD with UF O:BP (!) 101/39   Pulse 93   Temp (!) 96.8 F (36 C)   Resp (!) 35   Ht 5\' 6"  (1.676 m)   Wt (!) 211.8 kg   SpO2 94%   BMI 75.38 kg/m   Intake/Output Summary (Last 24 hours) at 09/07/2018 1008 Last data filed at 09/07/2018 1000 Gross per 24 hour  Intake 3347.8 ml  Output 8374 ml  Net -5026.2 ml   Intake/Output: I/O last 3 completed shifts: In: 4711.7 [I.V.:4201.8; NG/GT:210; IV Piggyback:299.9] Out: 56213 [Urine:45; Emesis/NG output:475; Other:11404]  Intake/Output this shift:  Total I/O In: 521.9 [I.V.:391.9; NG/GT:30; IV Piggyback:100] Out: 1061 [Other:1061] Weight change:  Gen: morbidly obese WM intubated and sedated YQM:VHQIO Resp: bilateraly rhonchi Abd: obese, +BS, soft Ext: 1+ edema/anasarca  Recent Labs  Lab 09/04/18 1600  09/05/18 0357  09/05/18 1518  09/06/18 0348 09/06/18 0356 09/06/18 1247 09/06/18 1256 09/06/18 2040 09/07/18 0355 09/07/18 0455  NA 137   < > 136   < > 134*   < > 134* 134* 134* 135 135 133* 133*  K 5.0   < > 5.1   < > 5.4*   < > 5.4* 5.6* 5.1 5.4* 5.2* 5.0 5.0  CL 96*  --  95*  --  99  --  98  --  101  --  100 101  --   CO2 34*  --  30  --  27  --  26  --  25  --  26 26  --   GLUCOSE 129*  --  159*  --  143*  --  130*  --  128*  --  124* 134*  --   BUN 33*  --  29*  --  29*  --  29*  --  31*  --  33* 34*  --   CREATININE 3.21*  --  2.90*  --  2.66*  --  2.56*  --  2.39*  --  2.54* 2.43*  --   ALBUMIN 2.4*  --  2.5*  --  2.5*  --  2.7*  --  2.6*  --  2.7* 2.8*  --   CALCIUM 8.4*  --  8.8*  --  8.9  --  9.3  --  9.3  --  9.6 9.7  --   PHOS 6.1*  --  4.0  --  4.0  --  4.1  --  4.1  --  3.8 4.1  --    < > = values in this interval not displayed.   Liver Function Tests: Recent Labs  Lab 09/06/18 1247 09/06/18 2040 09/07/18 0355  ALBUMIN 2.6* 2.7* 2.8*   No results for input(s): LIPASE, AMYLASE in  the last 168 hours. No results for input(s): AMMONIA in the last 168 hours. CBC: Recent Labs  Lab 09/03/18 0337  09/04/18 0400  09/05/18 0357  09/06/18 0348  09/06/18 1256 09/07/18 0355 09/07/18 0455  WBC 8.7  --  9.7  --  9.0  --  9.8  --   --  11.0*  --   HGB 13.0   < > 12.2*   < > 12.5*   < > 12.6*   < > 14.6 12.8* 15.0  HCT 45.9   < > 44.3   < > 46.0   < > 46.0   < > 43.0  47.0 44.0  MCV 91.6  --  92.1  --  95.6  --  96.0  --   --  95.7  --   PLT 132*  --  136*  --  133*  --  131*  --   --  135*  --    < > = values in this interval not displayed.   Cardiac Enzymes: Recent Labs  Lab 09/02/18 0430 09/02/18 0647 09/02/18 1103 09/02/18 1522 09/02/18 2307  TROPONINI 0.58* 0.48* 0.39* 0.34* 0.25*   CBG: Recent Labs  Lab 09/05/18 0743 09/05/18 1524 09/06/18 1009 09/06/18 1518 09/07/18 0845  GLUCAP 133* 142* 120* 133* 131*    Iron Studies: No results for input(s): IRON, TIBC, TRANSFERRIN, FERRITIN in the last 72 hours. Studies/Results: Dg Chest Port 1 View  Result Date: 09/07/2018 CLINICAL DATA:  Respiratory failure. EXAM: PORTABLE CHEST 1 VIEW COMPARISON:  Radiograph of September 04, 2018. FINDINGS: Stable cardiomegaly. Endotracheal and nasogastric tubes are unchanged in position. Left internal jugular catheter is unchanged in position. No pneumothorax is noted. Stable bilateral perihilar and basilar opacities are noted concerning for edema or atelectasis with probable small pleural effusions. IMPRESSION: Stable support apparatus. Stable bilateral lung opacities as described above. Electronically Signed   By: Lupita Raider, M.D.   On: 09/07/2018 08:22   Dg Chest Port 1 View  Result Date: 09/06/2018 CLINICAL DATA:  Endotracheal tube EXAM: PORTABLE CHEST 1 VIEW COMPARISON:  09/04/2018 FINDINGS: Endotracheal tube in good position.  NG tube in place. Cardiac enlargement with pulmonary vascular congestion. Bibasilar airspace disease is unchanged. Small pleural effusions  bilaterally also unchanged. Central line in the SVC unchanged. Suboptimal image due to patient size and difficulty with positioning. IMPRESSION: Cardiac enlargement with vascular congestion. No change in bibasilar atelectasis and small effusions. Electronically Signed   By: Marlan Palau M.D.   On: 09/06/2018 13:39   . artificial tears  1 application Both Eyes Q8H  . budesonide (PULMICORT) nebulizer solution  0.5 mg Nebulization BID  . chlorhexidine  15 mL Mouth Rinse BID  . feeding supplement (PRO-STAT SUGAR FREE 64)  30 mL Per Tube BID  . feeding supplement (VITAL HIGH PROTEIN)  1,000 mL Per Tube Q24H  . ipratropium-albuterol  3 mL Nebulization Q4H  . mouth rinse  15 mL Mouth Rinse 10 times per day  . methylPREDNISolone (SOLU-MEDROL) injection  40 mg Intravenous Q6H  . metoCLOPramide (REGLAN) injection  5 mg Intravenous Q6H  . nystatin cream   Topical BID  . pantoprazole (PROTONIX) IV  40 mg Intravenous QHS  . sodium chloride flush  3 mL Intravenous Q12H    BMET    Component Value Date/Time   NA 133 (L) 09/07/2018 0455   K 5.0 09/07/2018 0455   CL 101 09/07/2018 0355   CO2 26 09/07/2018 0355   GLUCOSE 134 (H) 09/07/2018 0355   BUN 34 (H) 09/07/2018 0355   CREATININE 2.43 (H) 09/07/2018 0355   CALCIUM 9.7 09/07/2018 0355   GFRNONAA 32 (L) 09/07/2018 0355   GFRAA 38 (L) 09/07/2018 0355   CBC    Component Value Date/Time   WBC 11.0 (H) 09/07/2018 0355   RBC 4.91 09/07/2018 0355   HGB 15.0 09/07/2018 0455   HCT 44.0 09/07/2018 0455   PLT 135 (L) 09/07/2018 0355   MCV 95.7 09/07/2018 0355   MCH 26.1 09/07/2018 0355   MCHC 27.2 (L) 09/07/2018 0355   RDW 21.4 (H) 09/07/2018 0355   LYMPHSABS 1.3 09/07/2018 2229   MONOABS  1.7 (H) 2018/08/28 2229   EOSABS 0.1 08/28/2018 2229   BASOSABS 0.1 August 28, 2018 2229     Assessment/Plan:  1. Dialysis dependent ARF- due to cardiac arrest/CIN/ischemic ATN.  Initiated on CVVHD 09/03/18 and tolerating fluid removal.  Remains oliguric.   Continue with CVVHDF for now.  2. Hyperkalemia- due to #1.  Recent change to 0K replacement fluids.  Will recheck K later today and if still elevated will change all fluids to 0K 3. VDRF- high PEEP and O2 demands.  4. Hyponatremia- due to hypervolemia.  Follow with UF 5. Presumed PE- on heparin gtt 6. Cardiac arrest- s/p hypothermia protocol 7. Shock- on pressors 8. Morbid obesity 9. Disposition- poor overall prognosis.  Consider palliative care consult to help set goals/limits of care.  Irena Cords, MD [employer] [phone]

## 2018-09-08 ENCOUNTER — Inpatient Hospital Stay (HOSPITAL_COMMUNITY): Payer: Medicare Other

## 2018-09-08 LAB — BLOOD GAS, ARTERIAL
ACID-BASE DEFICIT: 2.1 mmol/L — AB (ref 0.0–2.0)
Bicarbonate: 27.8 mmol/L (ref 20.0–28.0)
Drawn by: 347191
FIO2: 100
MECHVT: 440 mL
O2 SAT: 90.2 %
PCO2 ART: 112 mmHg — AB (ref 32.0–48.0)
PEEP: 18 cmH2O
Patient temperature: 99.9
RATE: 35 resp/min
pH, Arterial: 7.031 — CL (ref 7.350–7.450)
pO2, Arterial: 73.6 mmHg — ABNORMAL LOW (ref 83.0–108.0)

## 2018-09-08 LAB — CBC
HCT: 51.1 % (ref 39.0–52.0)
Hemoglobin: 13.5 g/dL (ref 13.0–17.0)
MCH: 25.5 pg — ABNORMAL LOW (ref 26.0–34.0)
MCHC: 26.4 g/dL — ABNORMAL LOW (ref 30.0–36.0)
MCV: 96.6 fL (ref 80.0–100.0)
Platelets: 152 10*3/uL (ref 150–400)
RBC: 5.29 MIL/uL (ref 4.22–5.81)
RDW: 21.5 % — ABNORMAL HIGH (ref 11.5–15.5)
WBC: 15.6 10*3/uL — ABNORMAL HIGH (ref 4.0–10.5)
nRBC: 0.2 % (ref 0.0–0.2)

## 2018-09-08 LAB — RENAL FUNCTION PANEL
Albumin: 2.9 g/dL — ABNORMAL LOW (ref 3.5–5.0)
Anion gap: 8 (ref 5–15)
BUN: 41 mg/dL — ABNORMAL HIGH (ref 6–20)
CALCIUM: 10.1 mg/dL (ref 8.9–10.3)
CO2: 25 mmol/L (ref 22–32)
Chloride: 100 mmol/L (ref 98–111)
Creatinine, Ser: 2.53 mg/dL — ABNORMAL HIGH (ref 0.61–1.24)
GFR calc Af Amer: 36 mL/min — ABNORMAL LOW (ref >60)
GFR, EST NON AFRICAN AMERICAN: 31 mL/min — AB (ref >60)
Glucose, Bld: 129 mg/dL — ABNORMAL HIGH (ref 70–99)
Phosphorus: 4.6 mg/dL (ref 2.5–4.6)
Potassium: 5.2 mmol/L — ABNORMAL HIGH (ref 3.5–5.1)
Sodium: 133 mmol/L — ABNORMAL LOW (ref 135–145)

## 2018-09-08 LAB — GLUCOSE, CAPILLARY: Glucose-Capillary: 135 mg/dL — ABNORMAL HIGH (ref 70–99)

## 2018-09-08 LAB — COOXEMETRY PANEL
Carboxyhemoglobin: 1.1 % (ref 0.5–1.5)
Methemoglobin: 1.5 % (ref 0.0–1.5)
O2 Saturation: 85.3 %
Total hemoglobin: 14.1 g/dL (ref 12.0–16.0)

## 2018-09-08 LAB — MAGNESIUM: Magnesium: 2.7 mg/dL — ABNORMAL HIGH (ref 1.7–2.4)

## 2018-09-08 LAB — HEPARIN LEVEL (UNFRACTIONATED): Heparin Unfractionated: 0.26 IU/mL — ABNORMAL LOW (ref 0.30–0.70)

## 2018-09-08 MED ORDER — SODIUM CHLORIDE 0.9 % IV SOLN
2.0000 mg/h | INTRAVENOUS | Status: DC
  Administered 2018-09-08: 3 mg/h via INTRAVENOUS
  Filled 2018-09-08: qty 10

## 2018-09-08 MED ORDER — SODIUM BICARBONATE 8.4 % IV SOLN
200.0000 meq | Freq: Once | INTRAVENOUS | Status: AC
  Administered 2018-09-08: 200 meq via INTRAVENOUS
  Filled 2018-09-08: qty 50

## 2018-09-08 MED ORDER — FENTANYL BOLUS VIA INFUSION
100.0000 ug | INTRAVENOUS | Status: DC | PRN
  Filled 2018-09-08: qty 100

## 2018-09-08 MED ORDER — MIDAZOLAM 50MG/50ML (1MG/ML) PREMIX INFUSION: 2.0000 mg/h | INTRAVENOUS | Status: DC

## 2018-09-08 MED ORDER — PRISMASOL BGK 0/2.5 32-2.5 MEQ/L REPLACEMENT SOLN
Status: DC
  Administered 2018-09-08: 11:00:00 via INTRAVENOUS_CENTRAL
  Filled 2018-09-08 (×3): qty 5000

## 2018-09-10 ENCOUNTER — Telehealth: Payer: Self-pay

## 2018-09-10 NOTE — Telephone Encounter (Signed)
Received dc from [facility] (original) DC is for cremation. Patient is a patient of Doctor Molli Knock.  DC will be taken to [facility] for signature.

## 2018-09-11 NOTE — Telephone Encounter (Signed)
Received original D/C signed-[facility]eral home notified for pick -up.  

## 2018-09-13 NOTE — Progress Notes (Signed)
Nitric was turned off at 11:38 per Dr. Molli Knock.

## 2018-09-13 NOTE — Progress Notes (Signed)
Patient ID: Andre [name], male   DOB: 02-24-1981, 38 y.o.   MRN: [id number] S: Andre [name] condition has continued to decline despite aggressive medical care.  He was turned down for ECMO at [facility] and [facility].  Discussed with the family the futility of ongoing care with the critical care team and plan for terminal wean.  Chaplain services have been requested O:BP (!) 111/41   Pulse (!) 104   Temp 97.7 F (36.5 C)   Resp (!) 35   Ht  (1.676 m)   Wt (!) 211.8 kg   SpO2 (!) 86%   BMI 75.38 kg/m   Intake/Output Summary (Last 24 hours) at 02-Oct-2018 1023 Last data filed at 2018-10-02 0800 Gross per 24 hour  Intake 3714.34 ml  Output 4710 ml  Net -995.66 ml   Intake/Output: I/O last 3 completed shifts: In: 5756.7 [I.V.:5006.7; NG/GT:450; IV Piggyback:299.9] Out: 9919 [Urine:20; Emesis/NG output:400; Other:9499]  Intake/Output this shift:  Total I/O In: 197.7 [I.V.:167.7; NG/GT:30] Out: 215 [Other:215] Weight change:  Gen: morbidly obese WM, intubated and critically ill CVS: tachy Resp: scattered rhonchi Abd: obese, +BS Ext:+1 anasarca  Recent Labs  Lab 09/05/18 1518  09/06/18 0348  09/06/18 1247 09/06/18 1256 09/06/18 2040 09/07/18 0355 09/07/18 0455 09/07/18 1658 October 02, 2018 0316  NA 134*   < > 134*   < > 134* 135 135 133* 133* 135 133*  K 5.4*   < > 5.4*   < > 5.1 5.4* 5.2* 5.0 5.0 5.3* 5.2*  CL 99  --  98  --  101  --  100 101  --  102 100  CO2 27  --  26  --  25  --  26 26  --  27 25  GLUCOSE 143*  --  130*  --  128*  --  124* 134*  --  136* 129*  BUN 29*  --  29*  --  31*  --  33* 34*  --  38* 41*  CREATININE 2.66*  --  2.56*  --  2.39*  --  2.54* 2.43*  --  2.62* 2.53*  ALBUMIN 2.5*  --  2.7*  --  2.6*  --  2.7* 2.8*  --  2.9* 2.9*  CALCIUM 8.9  --  9.3  --  9.3  --  9.6 9.7  --  10.0 10.1  PHOS 4.0  --  4.1  --  4.1  --  3.8 4.1  --  5.3* 4.6   < > = values in this interval not displayed.   Liver Function Tests: Recent Labs  Lab 09/07/18 0355  09/07/18 1658 10-02-18 0316  ALBUMIN 2.8* 2.9* 2.9*   No results for input(s): LIPASE, AMYLASE in the last 168 hours. No results for input(s): AMMONIA in the last 168 hours. CBC: Recent Labs  Lab 09/04/18 0400  09/05/18 0357  09/06/18 0348  09/07/18 0355 09/07/18 0455 10-02-18 0316  WBC 9.7  --  9.0  --  9.8  --  11.0*  --  15.6*  HGB 12.2*   < > 12.5*   < > 12.6*   < > 12.8* 15.0 13.5  HCT 44.3   < > 46.0   < > 46.0   < > 47.0 44.0 51.1  MCV 92.1  --  95.6  --  96.0  --  95.7  --  96.6  PLT 136*  --  133*  --  131*  --  135*  --  152   < > =  values in this interval not displayed.   Cardiac Enzymes: Recent Labs  Lab 09/02/18 0430 09/02/18 0647 09/02/18 1103 09/02/18 1522 09/02/18 2307  TROPONINI 0.58* 0.48* 0.39* 0.34* 0.25*   CBG: Recent Labs  Lab 09/07/18 1250 09/07/18 1629 09/07/18 2006 09/07/18 2326 Sep 26, 2018 0325  GLUCAP 122* 125* 150* 149* 135*    Iron Studies: No results for input(s): IRON, TIBC, TRANSFERRIN, FERRITIN in the last 72 hours. Studies/Results: Dg Chest Port 1 View  Result Date: 09/26/18 CLINICAL DATA:  38 year old male with acute respiratory failure. EXAM: PORTABLE CHEST 1 VIEW COMPARISON:  Chest radiograph dated 09/07/2018 FINDINGS: There has been interval retraction of the left IJ central venous line with tip likely in the innominate vein. The remainder of the visualized support devices are in similar position. Interval development of a confluent area of airspace opacity in the left upper lobe and right middle lobe. There is cardiomegaly with vascular congestion and probable mild interstitial edema. There are small bilateral pleural effusions. No pneumothorax. No acute osseous pathology. IMPRESSION: 1. Interval retraction of the left IJ central venous line with tip likely in the innominate vein. 2. Cardiomegaly with vascular congestion and mild interstitial edema. 3. Interval development of confluent airspace opacities in the left upper lobe and  right middle lobe concerning for pneumonia. Electronically Signed   By: Elgie Collard M.D.   On: Sep 26, 2018 05:05   Dg Chest Port 1 View  Result Date: 09/07/2018 CLINICAL DATA:  Endotracheal tube placement. EXAM: PORTABLE CHEST 1 VIEW COMPARISON:  Radiograph same day. FINDINGS: Endotracheal and nasogastric tubes are unchanged in position. This exam is limited as the lung bases are not included in field-of-view. Left internal jugular catheter is unchanged in position. Right internal jugular catheter is noted as well with tip in SVC. Bilateral perihilar opacities are noted. Bony thorax is unremarkable. IMPRESSION: Stable support apparatus. Limited exam as lung bases are not included in field-of-view. Stable bilateral perihilar opacities are noted. Electronically Signed   By: Lupita Raider, M.D.   On: 09/07/2018 13:28   Dg Chest Port 1 View  Result Date: 09/07/2018 CLINICAL DATA:  Respiratory failure. EXAM: PORTABLE CHEST 1 VIEW COMPARISON:  Radiograph of September 04, 2018. FINDINGS: Stable cardiomegaly. Endotracheal and nasogastric tubes are unchanged in position. Left internal jugular catheter is unchanged in position. No pneumothorax is noted. Stable bilateral perihilar and basilar opacities are noted concerning for edema or atelectasis with probable small pleural effusions. IMPRESSION: Stable support apparatus. Stable bilateral lung opacities as described above. Electronically Signed   By: Lupita Raider, M.D.   On: 09/07/2018 08:22   Dg Abd Portable 1v  Result Date: 09/07/2018 CLINICAL DATA:  Feeding tube placement. EXAM: PORTABLE ABDOMEN - 1 VIEW COMPARISON:  None. FINDINGS: The bowel gas pattern is normal. No radio-opaque calculi or other significant radiographic abnormality are seen. Distal tip of feeding tube is seen in expected position of second or third portion of duodenum. IMPRESSION: Distal tip of feeding tube seen in expected position of second or third portion of duodenum.  Electronically Signed   By: Lupita Raider, M.D.   On: 09/07/2018 12:37   . artificial tears  1 application Both Eyes Q8H  . B-complex with vitamin C  1 tablet Oral Daily  . budesonide (PULMICORT) nebulizer solution  0.5 mg Nebulization BID  . chlorhexidine  15 mL Mouth Rinse BID  . ipratropium-albuterol  3 mL Nebulization Q4H  . mouth rinse  15 mL Mouth Rinse 10 times per day  .  methylPREDNISolone (SOLU-MEDROL) injection  40 mg Intravenous Q6H  . nystatin cream   Topical BID  . pantoprazole (PROTONIX) IV  40 mg Intravenous QHS  . sodium chloride flush  3 mL Intravenous Q12H    BMET    Component Value Date/Time   NA 133 (L) 09/07/2018 0316   K 5.2 (H) 09/03/2018 0316   CL 100 08/29/2018 0316   CO2 25 08/22/2018 0316   GLUCOSE 129 (H) 09/01/2018 0316   BUN 41 (H) 09/07/2018 0316   CREATININE 2.53 (H) 08/16/2018 0316   CALCIUM 10.1 09/07/2018 0316   GFRNONAA 31 (L) 09/04/2018 0316   GFRAA 36 (L) 08/29/2018 0316   CBC    Component Value Date/Time   WBC 15.6 (H) 08/25/2018 0316   RBC 5.29 09/04/2018 0316   HGB 13.5 08/18/2018 0316   HCT 51.1 08/22/2018 0316   PLT 152 09/09/2018 0316   MCV 96.6 09/05/2018 0316   MCH 25.5 (L) 08/26/2018 0316   MCHC 26.4 (L) 08/22/2018 0316   RDW 21.5 (H) 09/11/2018 0316   LYMPHSABS 1.3 September 23, 2018 2229   MONOABS 1.7 (H) September 23, 2018 2229   EOSABS 0.1 09/23/18 2229   BASOSABS 0.1 2018/09/23 2229     Assessment/Plan:  1. Dialysis dependent ARF- due to cardiac arrest/CIN/ischemic ATN.  Initiated on CVVHD 09/03/18 and tolerating fluid removal.  Remains oliguric.  He has declined clinically and will only continue with CVVHDF until his family arrives for terminal wean.  Will discontinue pressors and CVVHD at that time.  No further interventions possible.  2. Hyperkalemia- due to #1.  Recent change to 0K replacement fluids.   3. VDRF- high PEEP and O2 demands.  4. Hyponatremia- due to hypervolemia.   5. Presumed PE- on heparin gtt 6. Cardiac  arrest- s/p hypothermia protocol 7. Shock- on pressors 8. Morbid obesity 9. Disposition- poor overall prognosis.  Pt failing aggressive measures and family informed.  Plan for terminal wean after other family members arrive. Irena Cords, MD [facility] [phone]

## 2018-09-13 NOTE — Progress Notes (Addendum)
NAME:  Andre [name], MRN:  [id number], DOB:  Feb 20, 1981, LOS: 11 ADMISSION DATE:  09/05/2018, CONSULTATION DATE: 08/31/2018 REFERRING MD: Ashley Royalty, CHIEF COMPLAINT: Hypoxemic respiratory failure  Brief History   38 y/o M admitted with shortness of breath initially admitted to [facility] for hypercapnic respiratory failure, obesity hypoventilation.  History of respiratory failure, obstructive sleep apnea on CPAP. Transferred to [facility] for cardiac arrest for presumed PE (inadequate CTA) and requiring critical support.  Past Medical History  Iron deficient anemia, chronic lymphedema, prior cellulitis, heart failure, hypertension, obesity hypoventilation syndrome, obstructive sleep apnea.  Significant Hospital Events   2/13  Admit with SOB, NIPPV 2/17  PCCM consulted for hypercarbic resp failure 2/18  Remains hypercarbic, bicarb >50, diamox added  2/18 PEA arrest, did have VT/VF episodes during this event.  Etiology unclear initially, presumptively hypoxia related.  Was difficult intubation by anesthesia.  Time to return of spontaneous circulation recorded at 20 minutes.  Received several rounds of epinephrine, 4-5 defibrillation, 2 rounds of bicarbonate, and magnesium sulfate.  Post intubation chest x-ray worrisome for peritracheal hematoma.  CT imaging ordered to rule out cerebral bleed, as well as further CT chest imaging to rule out pulmonary emboli as well as paratracheal hematoma. 2/20 Initiated CRRT for volume removal  2/24 Paralyzed, on pressors.  Has not tolerated TF.  On 18PEEP/Nitric 10  Consults:  PCCM 2/17   Procedures:  OETT 2/ 18 (DIFFICULT AIRWAY per anesthesia) >> Right IJ CVL 2/18 >> L IJ Vas Cath 2/20 >>  Significant Diagnostic Tests:  ECHO 2/15 >> LVEF > 65%, mildly dilated LV, RV pressure could not be assessed, LA severely dilated, diastolic dysfunction CT head 2/18 >> negative  CT chest 2/18 >> non-diagnostic for PE, small L, moderate R pleural effusions with dependent  consolidation, cardiomegaly, no evidence of mediastinal hematoma EEG 2/19 >> consistent with sedated state  ECHO 2/19 >> EF 55-60%. LE [place] 2/19 >> Limited exam. No evidence of DVT  Micro Data:  MRSA PCR 2/14 >>  negative BCx2 2/13 >> negative  RVP 2/21 >> negative Trach asp 2/20 >> enterobacter >> S-cefepime  Antimicrobials:  Diflucan 2/15 >16, 2/19 Unasyn 2/18 >> 2/19  Ceftriaxone 2/19 >> 2/21 Cefepime 2/21 >>   Interim history/subjective:  RN reports HD not running well / catheter problems.  Family at bedside  Objective   Blood pressure (!) 119/40, pulse (!) 104, temperature 98.8 F (37.1 C), resp. rate (!) 35, height  (1.676 m), weight (!) 211.8 kg, SpO2 (!) 89 %. CVP:  [12 mmHg-24 mmHg] 17 mmHg  Vent Mode: PRVC FiO2 (%):  [100 %] 100 % Set Rate:  [35 bmp] 35 bmp Vt Set:  [440 mL] 440 mL PEEP:  [18 cmH20] 18 cmH20 Plateau Pressure:  [31 cmH20-48 cmH20] 46 cmH20   Intake/Output Summary (Last 24 hours) at 09-24-2018 0820 Last data filed at 09-24-2018 0800 Gross per 24 hour  Intake 4099.02 ml  Output 5400 ml  Net -1300.98 ml   Filed Weights   09/06/18 0700 09/07/18 0733 September 24, 2018 0500  Weight: (!) 213.6 kg (!) 211.8 kg (!) 211.8 kg   Physical Exam: General: morbidly obese male lying in bed on vent, critically ill appearing  HEENT: MM pink/moist, ETT, bilateral IJ IV access  Neuro: sedate / paralyzed  CV: s1s2 rrr, no m/r/g PULM: even/non-labored, lungs bilaterally coarse rhonchi  WU:JWJX, non-tender, bsx4 active  Extremities: warm/dry, 1+ generalized edema  Skin: no rashes or lesions  Resolved Hospital Problem list  Assessment & Plan:   Acute on chronic hypoxemia and hypercarbic respiratory failure initially 2/2 decompensated OSA / OHS further c/b cardiopulmonary arrest. - DIFFICULT AIRWAY - Acutely hypoxic and cyanotic. Etiology unclear. Question cardiac event versus DVT although had been on Lovenox, also possible aspiration. Just prior to event was  fully awake and oriented so progressive hypercarbia seems unlikely. CTA chest unable to rule out PE, LE negative for DVT but limited study. P: PRVC 7cc/kg  Hold further ABG's / vent changes  Reviewed with family patients decline despite aggressive support > will transition to comfort once other sister arrives.   No plan for extubation given paralytics.  Will stop HD, Vasopressors.  Continue nitric for now Continue duoneb, pulmicort  Solumedrol 40 mg IV q6 for now  Stop heparin therapy   Shock -presumed septic shock and sedation. Echo limited exam with normal EF and no WMA.  P: Continue vasopressors until family arrives.  Once family available, will stop vasopressors & HD.  Likely will be minutes   Cardiac Arrest Diastolic heart failure, unknown chronicity -During arrest, initially PEA, did have episodes of VT/VF. S/p hypothermia protocol -Presumptively this was hypoxia.  However acute coronary event or thromboembolic event seem plausible.  There is also question of aspiration event.  Hypercarbia seems unlikely as he was fully awake and had been tolerating PCO2 in the 90s previously P: Stop heparin gtt  DNR/DNI  CVVHD to stop once family arrives   Acute Encephalopathy -concern for hypoxic encephalopathy s/p prolonged time to ROSC  -CT Head negative P: Promote all measures that promote comfort  Adjusted ceiling of pain / sedation medications  AKI  Likely secondary to ATN in setting of cardiac arrest and CIN  P: Appreciate Nephrology assistance   [place] Peritracheal / Right Mediastinal Mass, ruled out -Hematoma ruled out on CT 2/18 P: Hold further CXR imaging  At Risk Malnutrition  -did not tolerate TF due to high residual  P: Stop TF   Best practice:  Diet: NPO Pain/Anxiety/Delirium protocol (if indicated): ordered   VAP protocol (if indicated): ordered DVT prophylaxis: Heparin gtt GI prophylaxis: PPI Glucose control: n/a Mobility: Bedrest Code Status: Full  code Family Communication: Mother, brother and sister updated at bedside 2/25.  Reviewed patient decline despite aggressive support.  He is not a candidate for transfer for ECMO (declined at [facility]).  Family understands decline.  Await sister to arrive, will plan for comfort measures once they arrive.  Plan as above.  Disposition:  ICU   CC Time: 35 minutes   Canary Brim, NP-C [facility] Pgr: [phone] or if no answer [phone] 10/02/18, 8:20 AM   Attending Note:  38 year old male with super morbid obesity who presents to PCCM in ARDS post arrest.  Overnight, CRRT machine continued to malfunction due to catheter moving, infiltrate worsened, acidosis worsened (from a respiratory standpoint) and oxygenation worsened and patient became more hypotensive.  On exam, he remains paralyzed with coarse but distant BS.  I reviewed CXR myself, infiltrate worse and ETT is in a good position.  We had extensive conversation with the family.  It is obvious that we are at max support and [facility] said patient is unsafe for transport for ECMO.  We have very little options at this point.  We spoke with mother and brother.  Informed them that there is nothing else that we can do to help the patient.  After a long discussion, decision was made to  proceed with comfort measures but not extubation due to paralytics (did not want to stop that to avoid myoclonus for family's distress).  Nitric was stopped, pressors were stopped and patient expired within minutes with the family bedside.  Condolences given.  The patient is critically ill with multiple organ systems failure and requires high complexity decision making for assessment and support, frequent evaluation and titration of therapies, application of advanced monitoring technologies and extensive interpretation of multiple databases.   Critical Care Time devoted to patient care services described in this note is  90  Minutes. This  time reflects time of care of this signee Dr Koren Bound. This critical care time does not reflect procedure time, or teaching time or supervisory time of PA/NP/Med student/Med Resident etc but could involve care discussion time.  Alyson Reedy, M.D. [facility]/Critical Care Medicine. Pager: [phone]. After hours pager: [phone].

## 2018-09-13 NOTE — Progress Notes (Signed)
 of Versed and of Fentanyl wasted in sink.  Witnessed by Josiah Lobo RN

## 2018-09-13 NOTE — Progress Notes (Signed)
RT obtained ABG on pt and results as follows with critical values of pH 7.03 and PaCO2 112. RT notified [facility] RN of critical results and pts oxygenation issues. RT will continue to monitor.     Ref. Range 09/03/2018 04:55  Sample type Unknown ARTERIAL DRAW  Delivery systems Unknown VENTILATOR  FIO2 Unknown 100.00  Mode Unknown PRESSURE REGULATED VOLUME CONTROL  VT Latest Units: mL 440  Peep/cpap Latest Units: cm H20 18.0  pH, Arterial Latest Ref Range: 7.350 - 7.450  7.031 (LL)  pCO2 arterial Latest Ref Range: 32.0 - 48.0 mmHg 112 (HH)  pO2, Arterial Latest Ref Range: 83.0 - 108.0 mmHg 73.6 (L)  Acid-base deficit Latest Ref Range: 0.0 - 2.0 mmol/L 2.1 (H)  Bicarbonate Latest Ref Range: 20.0 - 28.0 mmol/L 27.8  O2 Saturation Latest Units: % 90.2  Patient temperature Unknown 99.9  Collection site Unknown BRACHIAL ARTERY  Allens test (pass/fail) Latest Ref Range: PASS  PASS

## 2018-09-13 NOTE — Progress Notes (Signed)
eLink Physician-Brief Progress Note Patient Name: GASPER ISHIKAWA DOB: 10/05/1980 MRN: [id number]   Date of Service  09/09/2018  HPI/Events of Note  ABG on 100%/PRVC 35/TV 440/P 18. = 7.031/112/73.6. Ppeak =  55. Already on Nitric Oxide. Reportedly not an ECMO candidate.  eICU Interventions  No room to move on ventilator. Needs GOC discussion with family. Will temporize with NaHCO3 200 meq IV.     Intervention Category Major Interventions: Respiratory failure - evaluation and management;Acid-Base disturbance - evaluation and management  Sommer,Steven Eugene 09/01/2018, 5:26 AM

## 2018-09-13 NOTE — Progress Notes (Signed)
   09-20-18 0900  Clinical Encounter Type  Visited With Patient and family together  Visit Type Follow-up  Referral From Nurse  Consult/Referral To Chaplain  Spiritual Encounters  Spiritual Needs Emotional;Prayer  Stress Factors  Patient Stress Factors Other (Comment)  Family Stress Factors Health changes;Major life changes;Loss of control   Responded to page from Nurse to visit family. Family was in room at bedside. Nurse stated that family received news that life support was about to get pulled from PT. I offered spiritual care with words of comfort, ministry of presence, empathic listening, prayer and sacred test reading. Chaplain available as needed.  Chaplain Orest Dikes  [phone]

## 2018-09-13 NOTE — Progress Notes (Addendum)
Family at bedside.  All have visited and prepared for transition to comfort measures.  Will stop vasopressors, HD, Heparin and Nitric Oxide therapy.  Anticipate it will be minutes to hours.  Will continue to support family & patient.  Full comfort measures but no extubation given paralytics.     Canary Brim, NP-C [place] [facility] Pgr: [phone] or if no answer [phone] 08/29/2018, 11:27 AM

## 2018-09-13 NOTE — Progress Notes (Signed)
Asystole noted on the monitor. Family at beside. Ventilator turned off. Second nurse Catilyn RN along with self verified no lung or heart sounds auscultated. Dr. Molli Knock and Canary Brim NP made aware of pt time of death. Pt pronounce 02-Oct-2018 @ 11:57.

## 2018-09-13 NOTE — Progress Notes (Signed)
ANTICOAGULATION CONSULT NOTE - Follow Up Consult  Pharmacy Consult for heparin Indication: suspected PE  Labs: Recent Labs    09/06/18 0348  09/07/18 0355 09/07/18 0455 09/07/18 1658 09/02/2018 0316  HGB 12.6*   < > 12.8* 15.0  --  13.5  HCT 46.0   < > 47.0 44.0  --  51.1  PLT 131*  --  135*  --   --  152  HEPARINUNFRC 0.30  --  0.37  --   --  0.26*  CREATININE 2.56*   < > 2.43*  --  2.62* 2.53*   < > = values in this interval not displayed.    Assessment: 37yo male now subtherapeutic on heparin after several levels at lower end of goal; no gtt issues or signs of bleeding per RN.  Goal of Therapy:  Heparin level 0.3-0.7 units/ml   Plan:  Will increase heparin gtt by ~10% to 2800 units/hr and check level in 6 hours.   Vernard Gambles, PharmD, BCPS  09/04/2018,4:27 AM

## 2018-09-13 DEATH — deceased

## 2018-10-14 NOTE — Death Summary Note (Signed)
DEATH SUMMARY   Patient Details  Name: Andre [name] MRN: [id number] DOB: 1981/05/08  Admission/Discharge Information   Admit Date:  2019/01/17  Date of Death: Date of Death: 08/16/2018  Time of Death: Time of Death: 1157  Length of Stay: 11  Referring Physician: Annita BrodAsenso, Philip, MD   Reason(s) for Hospitalization  Cardiac arrest  Diagnoses  Preliminary cause of death:   PEA cardiac arrest  Secondary Diagnoses (including complications and co-morbidities):  Principal Problem:   Acute on chronic respiratory failure with hypoxia [facility](HCC) Active Problems:   Essential hypertension   Acute on chronic diastolic CHF (congestive heart failure) (HCC)   Obesity hypoventilation syndrome [facility](HCC)   Lymphedema   Brief Hospital Course (including significant findings, care, treatment, and services provided and events leading to death)  38 year old male with super morbid obesity who presents to PCCM in ARDS post arrest.  Overnight, CRRT machine continued to malfunction due to catheter moving, infiltrate worsened, acidosis worsened (from a respiratory standpoint) and oxygenation worsened and patient became more hypotensive.  On exam, he remains paralyzed with coarse but distant BS.  I reviewed CXR myself, infiltrate worse and ETT is in a good position.  We had extensive conversation with the family.  It is obvious that we are at max support and [facility] patient is unsafe for transport for ECMO.  We have very little options at this point.  We spoke with mother and brother.  Informed them that there is nothing else that we can do to help the patient.  After a long discussion, decision was made to proceed with comfort measures but not extubation due to paralytics (did not want to stop that to avoid myoclonus for family's distress).  Nitric was stopped, pressors were stopped and patient expired within minutes with the family bedside.  Condolences given.   Pertinent Labs and Studies   Significant Diagnostic Studies Dg Chest 1 View  Result Date: 09/01/2018 CLINICAL DATA:  Difficult intubation. EXAM: CHEST  1 VIEW COMPARISON:  Radiographs of August 31, 2018. FINDINGS: Stable cardiomegaly with central pulmonary vascular congestion and possible pulmonary edema. Nasogastric tube is seen entering the stomach. The endotracheal tube is seen projected over tracheal air shadow, but it is difficult to evaluate its distal tip due to body habitus. It appears to be at least several cm above the carina. Right internal jugular catheter is noted with tip in expected position of right innominate vein. There is widening of the right paratracheal region which may represent mass or possibly hematoma. No pneumothorax or effusion is noted. Visualized bony thorax is unremarkable. IMPRESSION: Widening of the right paratracheal region is noted concerning for possible mediastinal mass or hematoma. CT scan of the chest with intravenous contrast is recommended for further evaluation. Nasogastric tube appears to be in grossly good position. Endotracheal tube is projected over tracheal air shadow, but distal tip is not well visualized due to body habitus, but it appears to be at least several cm above the carina. Repeat radiograph is recommended. These results will be called to the ordering clinician or representative by the Radiologist Assistant, and communication documented in the PACS or zVision Dashboard. Stable cardiomegaly is noted with central pulmonary vascular congestion and probable bilateral pulmonary edema. Electronically Signed   By: Lupita RaiderJames  Green Jr, M.D.   On: 09/01/2018 13:56   Dg Chest 1 View  Result Date: 08/31/2018 CLINICAL DATA:  Hypoxemia. EXAM: CHEST  1 VIEW COMPARISON:  08/30/2018 FINDINGS: Cardiac silhouette is mildly enlarged.  There is vascular congestion with opacity central to lower lungs similar to the previous day's exam, greater at the right base. No convincing pneumonia. IMPRESSION: 1.  No significant change from the prior day's study. Cardiomegaly with vascular congestion and mild hazy airspace opacities consistent with congestive heart failure. No new abnormalities. Electronically Signed   By: Amie Portland M.D.   On: 08/31/2018 15:04   Dg Chest 1 View  Result Date: 08/30/2018 CLINICAL DATA:  Hypoxia. Chronic respiratory failure, H/o CHF, HTN. Smoker. EXAM: CHEST  1 VIEW COMPARISON:  Chest x-rays dated 2018-09-24 and 01/23/2018 FINDINGS: Stable cardiomegaly. Overall cardiomediastinal silhouette appears stable in size and configuration. There is increased central pulmonary vascular congestion and bilateral interstitial edema. Probable small RIGHT pleural effusion. No pneumothorax appreciated. Osseous structures about the chest are unremarkable. IMPRESSION: 1. Cardiomegaly with central pulmonary vascular congestion and bilateral interstitial edema, consistent with CHF/volume overload and indicative of worsening fluid status. 2. Probable small RIGHT pleural effusion. Electronically Signed   By: Bary Richard M.D.   On: 08/30/2018 15:08   Ct Head Wo Contrast  Result Date: 09/01/2018 CLINICAL DATA:  38 y/o M; episode of pulseless arrest post 20 minutes CPR and defibrillation. Persistent unresponsiveness. EXAM: CT HEAD WITHOUT CONTRAST TECHNIQUE: Contiguous axial images were obtained from the base of the skull through the vertex without intravenous contrast. COMPARISON:  None. FINDINGS: Brain: No evidence of acute infarction, hemorrhage, hydrocephalus, extra-axial collection or mass lesion/mass effect. Vascular: No hyperdense vessel or unexpected calcification. Skull: Normal. Negative for fracture or focal lesion. Sinuses/Orbits: Ethmoid air cell mucosal thickening. Additional visible paranasal sinuses and the mastoid air cells are normally aerated. Orbits are unremarkable. Other: None. IMPRESSION: No acute intracranial abnormality identified. Unremarkable CT of the head. Electronically Signed    By: Mitzi Hansen M.D.   On: 09/01/2018 14:55   Ct Angio Chest Pe W Or Wo Contrast  Result Date: 09/01/2018 CLINICAL DATA:  Post resuscitation, cardiac arrest, concern for mediastinal hematoma EXAM: CT ANGIOGRAPHY CHEST WITH CONTRAST TECHNIQUE: Multidetector CT imaging of the chest was performed using the standard protocol during bolus administration of intravenous contrast. Multiplanar CT image reconstructions and MIPs were obtained to evaluate the vascular anatomy. CONTRAST:  ISOVUE-370 IOPAMIDOL (ISOVUE-370) INJECTION 76% COMPARISON:  Chest radiograph, 09/01/2018 FINDINGS: Examination is generally somewhat limited by extreme body habitus. Cardiovascular: Contrast bolus is insubstantial, main pulmonary artery 75 HU, with minimal contrast observed in the dependent veins of the left upper extremity. Cardiomegaly. No pericardial effusion. Mediastinum/Nodes: No enlarged mediastinal, hilar, or axillary lymph nodes. Thyroid gland, trachea, and esophagus demonstrate no significant findings. Lungs/Pleura: There are small left, moderate right pleural effusions with associated dependent atelectasis or consolidation. There are dense paramedian consolidations of the bilateral upper lobes. Endotracheal tube is positioned with tip above the carina. Upper Abdomen: No acute abnormality. Musculoskeletal: No chest wall abnormality. No acute or significant osseous findings. Review of the MIP images confirms the above findings. IMPRESSION: 1. Contrast bolus is insubstantial, main pulmonary artery 75 HU, with minimal contrast observed in the dependent veins of the left upper extremity. Examination is nondiagnostic for pulmonary embolism. 2. There are small left, moderate right pleural effusions with associated dependent atelectasis or consolidation. There are dense paramedian consolidations of the bilateral upper lobes. 3.  Cardiomegaly. 4. Endotracheal tube is positioned appropriately with tip above the carina.  5. No evidence of mediastinal hematoma or other resuscitation related trauma. Electronically Signed   By: Lauralyn Primes M.D.   On: 09/01/2018 15:12  Dg Chest Port 1 View  Result Date: 08/20/2018 CLINICAL DATA:  38 year old male with acute respiratory failure. EXAM: PORTABLE CHEST 1 VIEW COMPARISON:  Chest radiograph dated 09/07/2018 FINDINGS: There has been interval retraction of the left IJ central venous line with tip likely in the innominate vein. The remainder of the visualized support devices are in similar position. Interval development of a confluent area of airspace opacity in the left upper lobe and right middle lobe. There is cardiomegaly with vascular congestion and probable mild interstitial edema. There are small bilateral pleural effusions. No pneumothorax. No acute osseous pathology. IMPRESSION: 1. Interval retraction of the left IJ central venous line with tip likely in the innominate vein. 2. Cardiomegaly with vascular congestion and mild interstitial edema. 3. Interval development of confluent airspace opacities in the left upper lobe and right middle lobe concerning for pneumonia. Electronically Signed   By: Elgie Collard M.D.   On: 09/01/2018 05:05   Dg Chest Port 1 View  Result Date: 09/07/2018 CLINICAL DATA:  Endotracheal tube placement. EXAM: PORTABLE CHEST 1 VIEW COMPARISON:  Radiograph same day. FINDINGS: Endotracheal and nasogastric tubes are unchanged in position. This exam is limited as the lung bases are not included in field-of-view. Left internal jugular catheter is unchanged in position. Right internal jugular catheter is noted as well with tip in SVC. Bilateral perihilar opacities are noted. Bony thorax is unremarkable. IMPRESSION: Stable support apparatus. Limited exam as lung bases are not included in field-of-view. Stable bilateral perihilar opacities are noted. Electronically Signed   By: Lupita Raider, M.D.   On: 09/07/2018 13:28   Dg Chest Port 1 View  Result  Date: 09/07/2018 CLINICAL DATA:  Respiratory failure. EXAM: PORTABLE CHEST 1 VIEW COMPARISON:  Radiograph of September 04, 2018. FINDINGS: Stable cardiomegaly. Endotracheal and nasogastric tubes are unchanged in position. Left internal jugular catheter is unchanged in position. No pneumothorax is noted. Stable bilateral perihilar and basilar opacities are noted concerning for edema or atelectasis with probable small pleural effusions. IMPRESSION: Stable support apparatus. Stable bilateral lung opacities as described above. Electronically Signed   By: Lupita Raider, M.D.   On: 09/07/2018 08:22   Dg Chest Port 1 View  Result Date: 09/06/2018 CLINICAL DATA:  Endotracheal tube EXAM: PORTABLE CHEST 1 VIEW COMPARISON:  09/04/2018 FINDINGS: Endotracheal tube in good position.  NG tube in place. Cardiac enlargement with pulmonary vascular congestion. Bibasilar airspace disease is unchanged. Small pleural effusions bilaterally also unchanged. Central line in the SVC unchanged. Suboptimal image due to patient size and difficulty with positioning. IMPRESSION: Cardiac enlargement with vascular congestion. No change in bibasilar atelectasis and small effusions. Electronically Signed   By: Marlan Palau M.D.   On: 09/06/2018 13:39   Dg Chest Port 1 View  Result Date: 09/04/2018 CLINICAL DATA:  Ventilator dependent respiratory failure. Acute oxygen desaturation. EXAM: PORTABLE CHEST 1 VIEW COMPARISON:  09/03/2018 and earlier. FINDINGS: Endotracheal tube tip in satisfactory position approximately 5 cm above the carina. RIGHT jugular central venous catheter tip projects over the expected location of the jugular vein with the innominate vein. LEFT jugular central venous catheter has withdrawn slightly though its tip still projects over the expected location of the LEFT innominate vein. External pacing pads are present. Stable marked cardiomegaly. Worsening pulmonary venous hypertension and likely minimal to mild  interstitial pulmonary edema. Worsening BILATERAL pleural effusions and associated airspace consolidation in the lower lobes. IMPRESSION: 1.  Support apparatus satisfactory. 2. Worsening pulmonary venous hypertension and likely minimal to mild  interstitial pulmonary edema, query fluid overload. 3. Worsening BILATERAL pleural effusions and associated atelectasis and/or pneumonia in the lower lobes. Electronically Signed   By: Hulan Saas M.D.   On: 09/04/2018 14:05   Dg Chest Port 1 View  Result Date: 09/03/2018 CLINICAL DATA:  Central line placement EXAM: PORTABLE CHEST 1 VIEW COMPARISON:  09/03/2018, 09/01/2018, CT 09/01/2018 FINDINGS: Endotracheal tube tip is about 5 cm superior to the carina. Left IJ central venous catheter tip overlies the distal brachiocephalic region. Right IJ catheter tip at the distal jugular vein. No left pneumothorax. Esophageal tube tip is poorly visible. Suspected small pleural effusions. Cardiomegaly with central vascular congestion. Right greater than left apical airspace disease. IMPRESSION: 1. New left IJ central venous catheter with tip superimposing the distal brachiocephalic vein. No pneumothorax 2. Poorly visible distal tip of the esophageal tube 3. Cardiomegaly with suspected small pleural effusions. Vascular calcification. 4. Widened upper mediastinum, likely due to right greater than left medial apical airspace disease noted on previous CT. Electronically Signed   By: Jasmine Pang M.D.   On: 09/03/2018 21:35   Dg Chest Port 1 View  Result Date: 09/03/2018 CLINICAL DATA:  38 year old male with a history of respiratory failure and hypoxia EXAM: PORTABLE CHEST 1 VIEW COMPARISON:  09/01/2018, 08/31/2018, 08/30/2018, CT 09/01/2018 FINDINGS: Cardiomediastinal silhouette unchanged from prior with widened upper mediastinum. This is favored to be a combination of both the mediastinal structures and medially projecting lung opacities of the upper lobes. Worsening aeration  of the right lower lobe with mixed interstitial and airspace opacities and obscuration the right hemidiaphragm. Similar appearance of left lower lobe mixed interstitial and airspace disease. No pneumothorax. Endotracheal tube terminates above the carina 4.2 cm. Gastric tube terminates out of the field of view. Right IJ central catheter with the tip appearing to terminate superior vena cava. Defibrillator pads project over the chest. IMPRESSION: Worsening aeration of the right lower lobe with progressing interstitial and airspace opacity, with the opacity potentially combination of edema, infection/inflammation, atelectasis, pleural effusion. Similar appearance of the left lung base with mixed interstitial and airspace disease. Widened upper mediastinum, likely a combination of the mediastinal structures and airspace consolidation/atelectasis of the upper lobes present on prior CT. Unchanged endotracheal tube, gastric tube, right IJ catheter. Defibrillator pads project over the chest. Electronically Signed   By: Gilmer Mor D.O.   On: 09/03/2018 09:13   Dg Chest Port 1 View  Result Date: 08/26/2018 CLINICAL DATA:  Dyspnea starting on Monday and progressively worsening. EXAM: PORTABLE CHEST 1 VIEW COMPARISON:  07/15/2018 FINDINGS: Stable cardiomegaly with mild diffuse interstitial edema. Nonaneurysmal appearing thoracic aorta. No alveolar consolidation to suggest pneumonia. The left costophrenic angle is excluded on this study. Definite effusion or pneumothorax. No acute osseous abnormality. IMPRESSION: Cardiomegaly with mild interstitial edema. Electronically Signed   By: Tollie Eth M.D.   On: 08/22/2018 20:33   Dg Abd Portable 1v  Result Date: 09/07/2018 CLINICAL DATA:  Feeding tube placement. EXAM: PORTABLE ABDOMEN - 1 VIEW COMPARISON:  None. FINDINGS: The bowel gas pattern is normal. No radio-opaque calculi or other significant radiographic abnormality are seen. Distal tip of feeding tube is seen in  expected position of second or third portion of duodenum. IMPRESSION: Distal tip of feeding tube seen in expected position of second or third portion of duodenum. Electronically Signed   By: Lupita Raider, M.D.   On: 09/07/2018 12:37   Vas [place] Lower Extremity Venous (dvt)  Result Date: 09/02/2018  Lower Venous  Study Indications: Swelling.  Limitations: Body habitus and poor ultrasound/tissue interface. Performing Technologist: Chanda Busing RVT  Examination Guidelines: A complete evaluation includes B-mode imaging, spectral Doppler, color Doppler, and power Doppler as needed of all accessible portions of each vessel. Bilateral testing is considered an integral part of a complete examination. Limited examinations for reoccurring indications may be performed as noted.  Right Venous Findings: +---------+---------------+---------+-----------+----------+-------+          CompressibilityPhasicitySpontaneityPropertiesSummary +---------+---------------+---------+-----------+----------+-------+ CFV      Full           Yes      Yes                          +---------+---------------+---------+-----------+----------+-------+ SFJ      Full                                                 +---------+---------------+---------+-----------+----------+-------+ FV Prox  Full                                                 +---------+---------------+---------+-----------+----------+-------+ FV Mid   Full                                                 +---------+---------------+---------+-----------+----------+-------+ FV DistalFull                                                 +---------+---------------+---------+-----------+----------+-------+ PFV      Full                                                 +---------+---------------+---------+-----------+----------+-------+ POP      Full           Yes      Yes                           +---------+---------------+---------+-----------+----------+-------+ PTV      Full                                                 +---------+---------------+---------+-----------+----------+-------+ PERO     Full                                                 +---------+---------------+---------+-----------+----------+-------+  Left Venous Findings: +---------+---------------+---------+-----------+----------+--------------+          CompressibilityPhasicitySpontaneityPropertiesSummary        +---------+---------------+---------+-----------+----------+--------------+ CFV      Full  Yes      Yes                                 +---------+---------------+---------+-----------+----------+--------------+ SFJ      Full                                                        +---------+---------------+---------+-----------+----------+--------------+ FV Prox  Full                                                        +---------+---------------+---------+-----------+----------+--------------+ FV Mid   Full                                                        +---------+---------------+---------+-----------+----------+--------------+ FV DistalFull                                                        +---------+---------------+---------+-----------+----------+--------------+ PFV      Full                                                        +---------+---------------+---------+-----------+----------+--------------+ POP      Full           Yes      Yes                                 +---------+---------------+---------+-----------+----------+--------------+ PTV      Full                                                        +---------+---------------+---------+-----------+----------+--------------+ PERO                                                  Not visualized  +---------+---------------+---------+-----------+----------+--------------+    Summary: Right: There is no evidence of deep vein thrombosis in the lower extremity. However, portions of this examination were limited- see technologist comments above. No cystic structure found in the popliteal fossa. Left: There is no evidence of deep vein thrombosis in the lower extremity. However, portions of this examination were limited- see technologist comments above. No cystic structure found in the popliteal fossa.  *See table(s) above for measurements and observations. Electronically signed by Lemar Livings  MD on 09/02/2018 at 5:30:41 PM.    Final     Microbiology No results found for this or any previous visit (from the past 240 hour(s)).  Lab Basic Metabolic Panel: No results for input(s): NA, K, CL, CO2, GLUCOSE, BUN, CREATININE, CALCIUM, MG, PHOS in the last 168 hours. Liver Function Tests: No results for input(s): AST, ALT, ALKPHOS, BILITOT, PROT, ALBUMIN in the last 168 hours. No results for input(s): LIPASE, AMYLASE in the last 168 hours. No results for input(s): AMMONIA in the last 168 hours. CBC: No results for input(s): WBC, NEUTROABS, HGB, HCT, MCV, PLT in the last 168 hours. Cardiac Enzymes: No results for input(s): CKTOTAL, CKMB, CKMBINDEX, TROPONINI in the last 168 hours. Sepsis Labs: No results for input(s): PROCALCITON, WBC, LATICACIDVEN in the last 168 hours.  Procedures/Operations     Weslee Prestage 09/17/2018, 8:01 AM

## 2019-03-29 IMAGING — DX DG CHEST 1V PORT
1 series · 1 of 1 positions shown · non-contrast
Comparison: 07/15/2018

CLINICAL DATA: Dyspnea starting on [REDACTED] and progressively
worsening.

EXAM:
PORTABLE CHEST 1 VIEW

[chest ap]
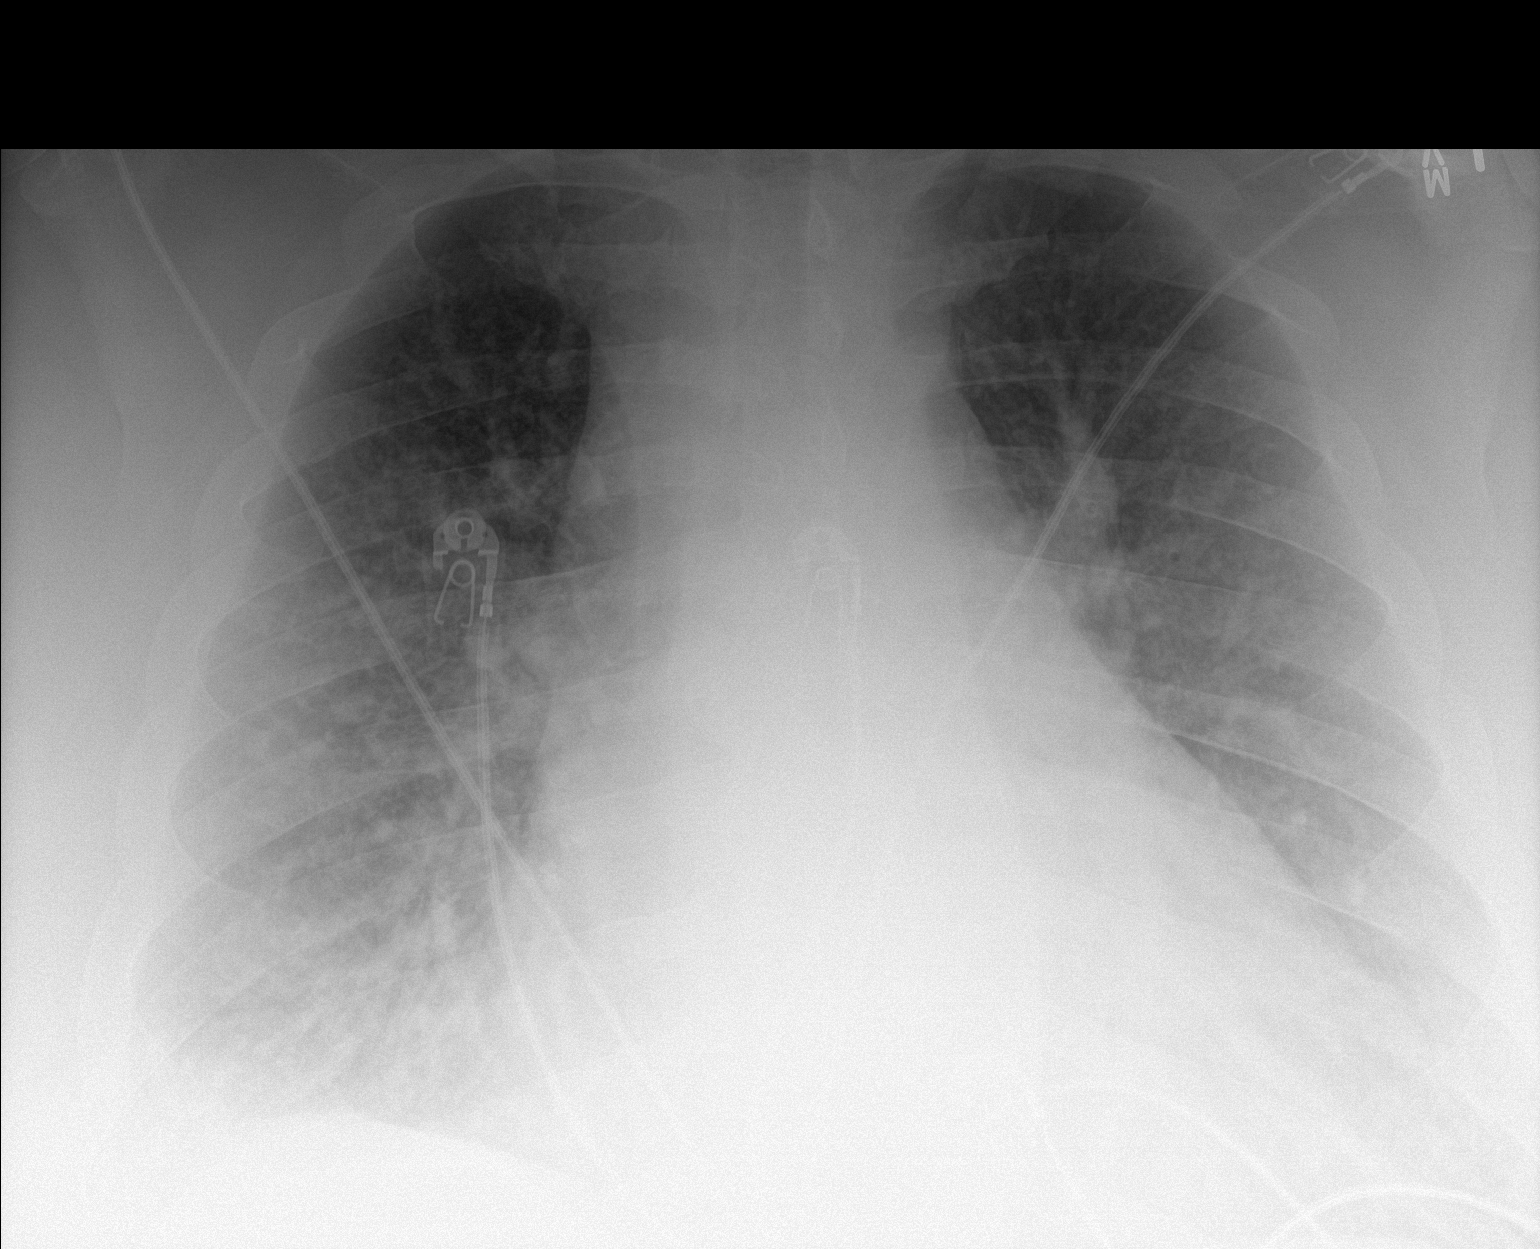

[1 of 1 positions shown; findings below may reference images not displayed]

FINDINGS: Stable cardiomegaly with mild diffuse interstitial edema.
Nonaneurysmal appearing thoracic aorta. No alveolar consolidation to
suggest pneumonia. The left costophrenic angle is excluded on this
study. Definite effusion or pneumothorax. No acute osseous
abnormality.
IMPRESSION: Cardiomegaly with mild interstitial edema.

## 2019-04-01 IMAGING — DX DG CHEST 1V
2 series · 2 of 2 positions shown · non-contrast
Comparison: Chest x-rays dated 08/27/2018 and 01/23/2018

CLINICAL DATA: Hypoxia. Chronic respiratory failure, H/o CHF, HTN.
Smoker.

EXAM:
CHEST  1 VIEW

[chest ap (1 of 2)]
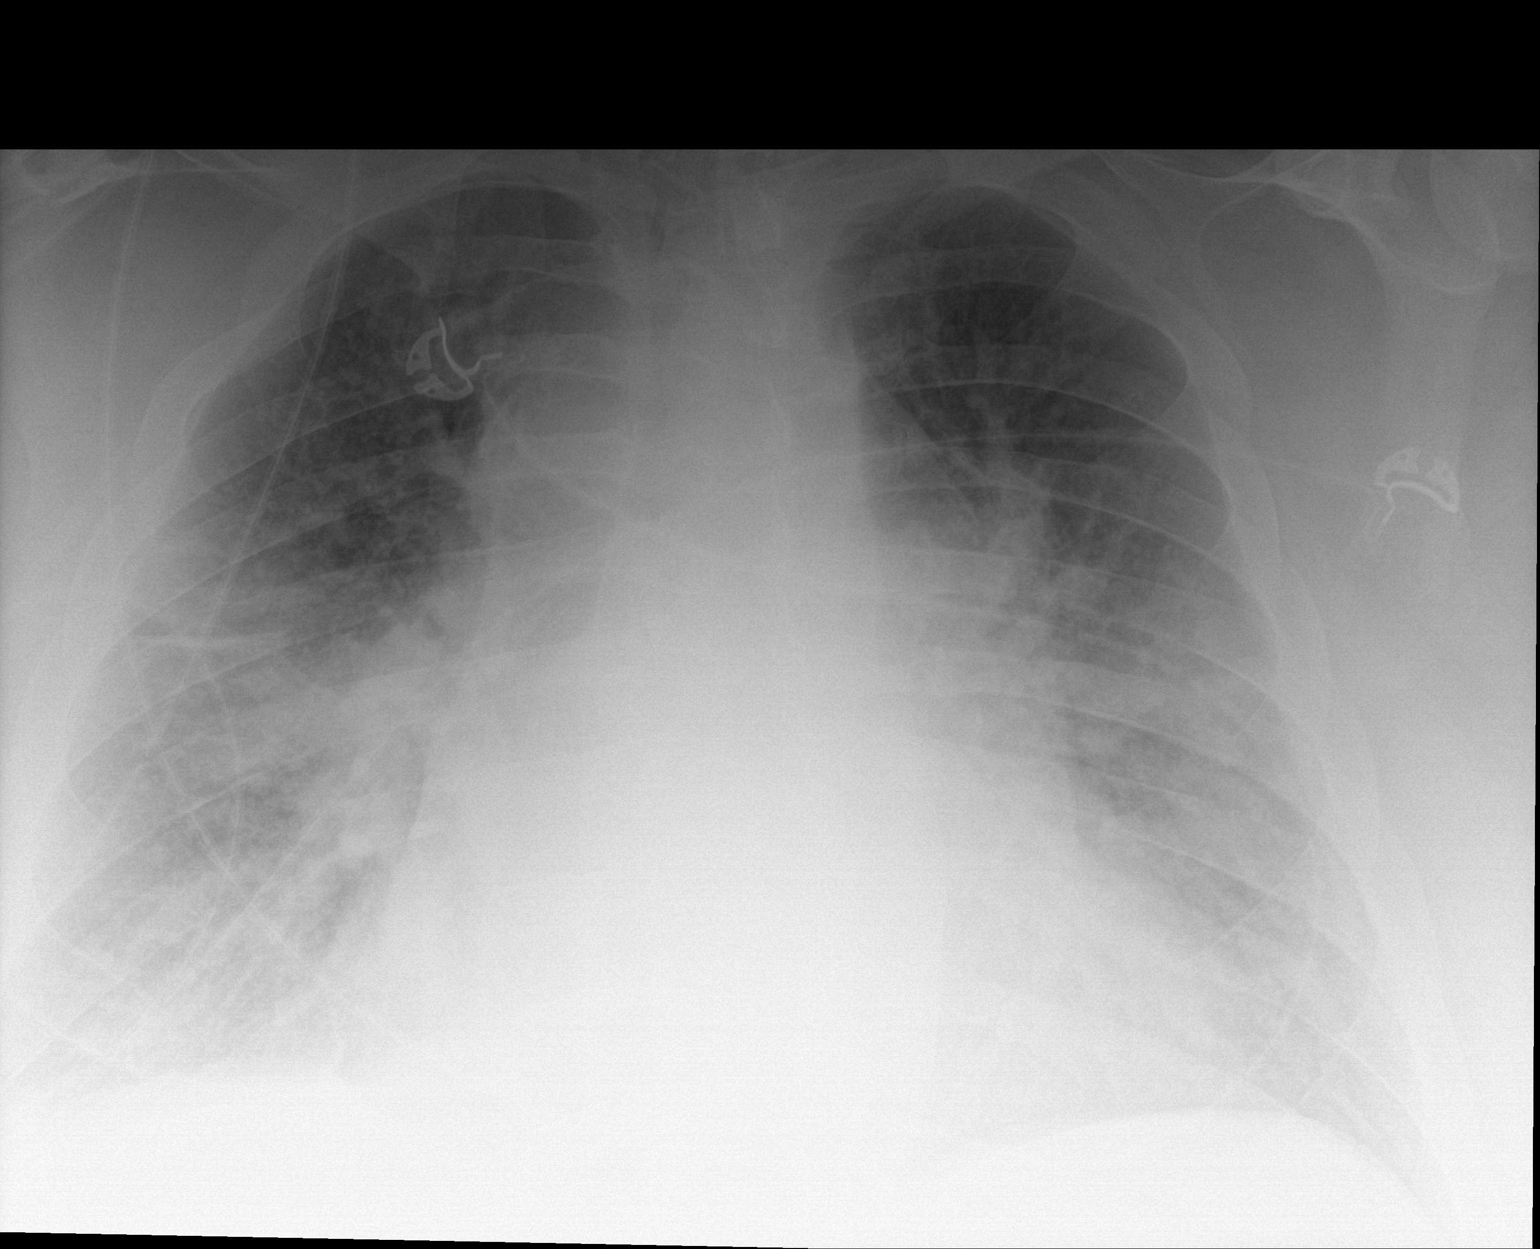

[chest ap (2 of 2)]
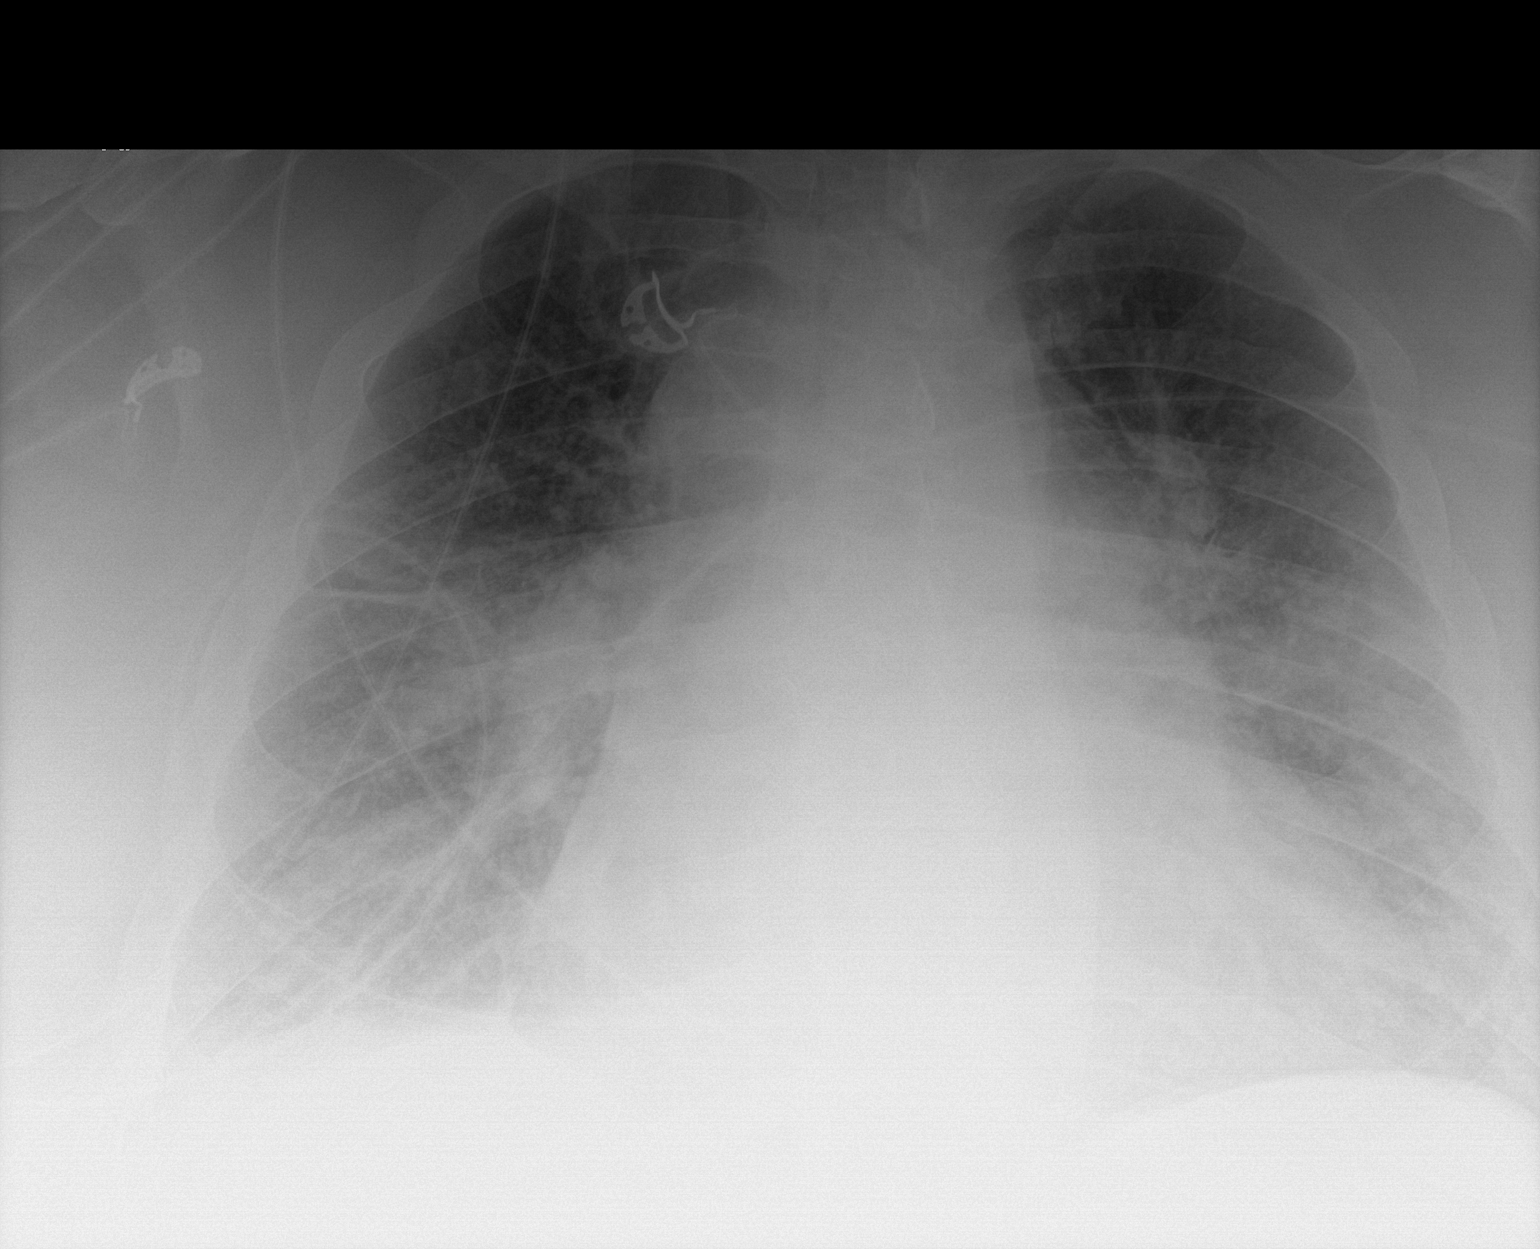

[2 of 2 positions shown; findings below may reference images not displayed]

FINDINGS: Stable cardiomegaly. Overall cardiomediastinal silhouette appears
stable in size and configuration.

There is increased central pulmonary vascular congestion and
bilateral interstitial edema. Probable small RIGHT pleural effusion.
No pneumothorax appreciated. Osseous structures about the chest are
unremarkable.
IMPRESSION: 1. Cardiomegaly with central pulmonary vascular congestion and
bilateral interstitial edema, consistent with CHF/volume overload
and indicative of worsening fluid status.
2. Probable small RIGHT pleural effusion.

## 2019-04-03 IMAGING — CT CT ANGIO CHEST
2 of 6 series · 18 of 46 positions shown · IV contrast (ISOVUE 370)
Comparison: Chest radiograph, 09/01/2018

CLINICAL DATA: Post resuscitation, cardiac arrest, concern for
mediastinal hematoma

EXAM:
CT ANGIOGRAPHY CHEST WITH CONTRAST
TECHNIQUE: Multidetector CT imaging of the chest was performed using the
standard protocol during bolus administration of intravenous
contrast. Multiplanar CT image reconstructions and MIPs were
obtained to evaluate the vascular anatomy.
CONTRAST:  100mL NVBQW0-BLQ IOPAMIDOL (NVBQW0-BLQ) INJECTION 76%

[Series 6: thins · axial · 0.96mm/px · z∈[-382,-102]mm · 16 of 308 slices shown]
[im 14/308  lung]
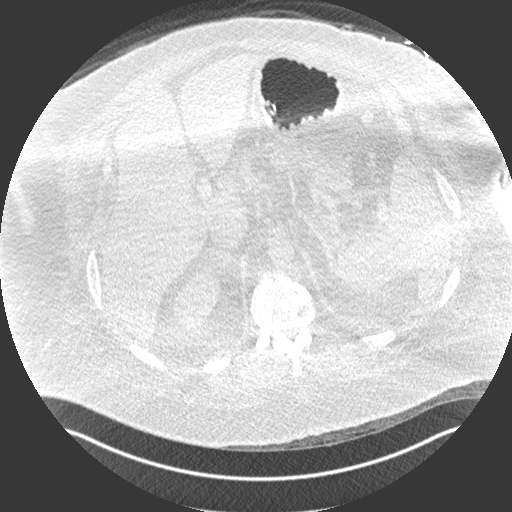
[im 41/308  soft-tissue]
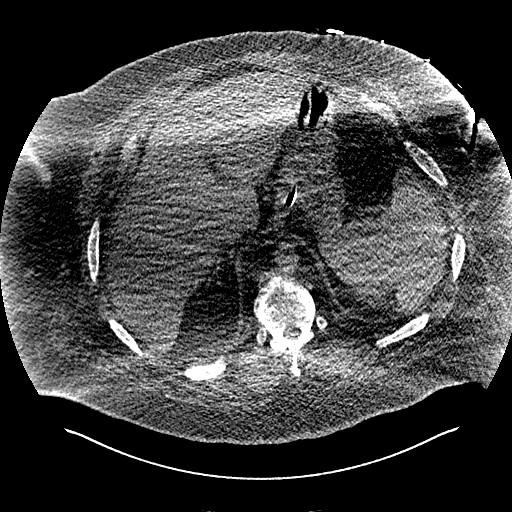
[im 54/308  lung]
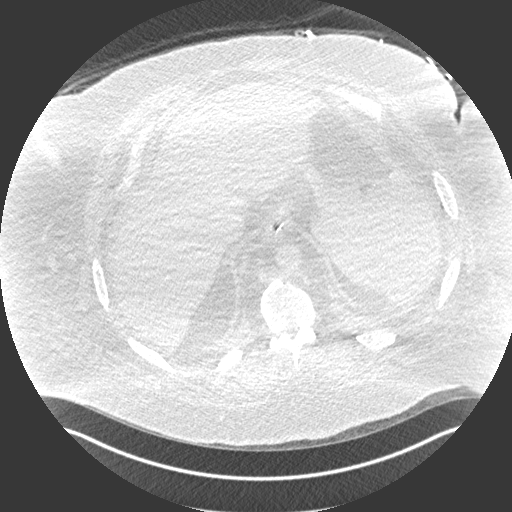
[im 67/308  soft-tissue]
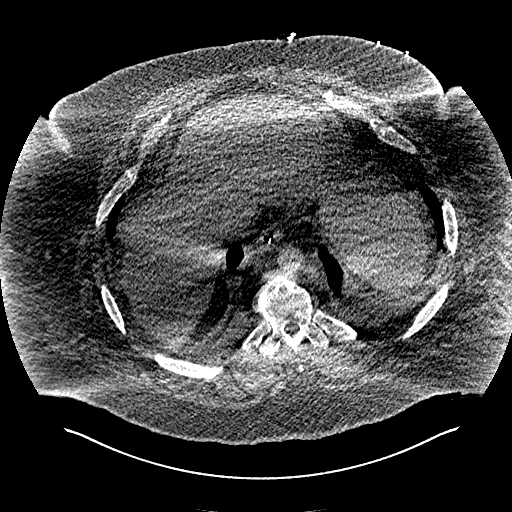
[im 94/308  lung]
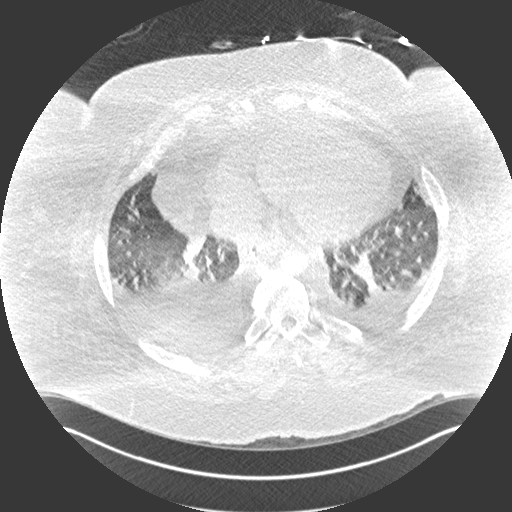
[im 107/308  soft-tissue]
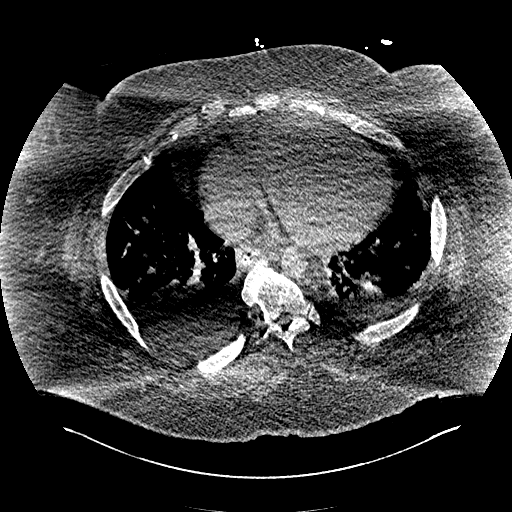
[im 121/308  lung]
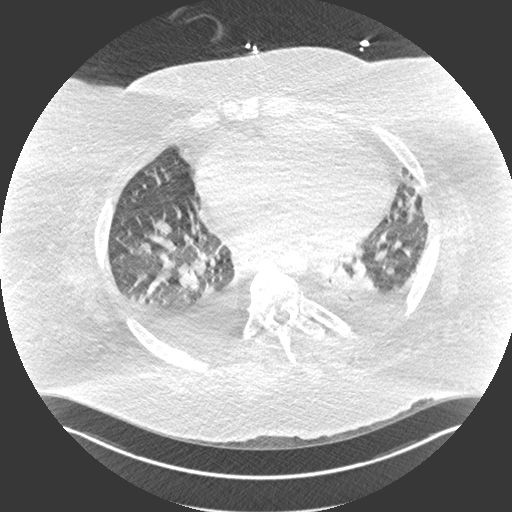
[im 147/308  soft-tissue]
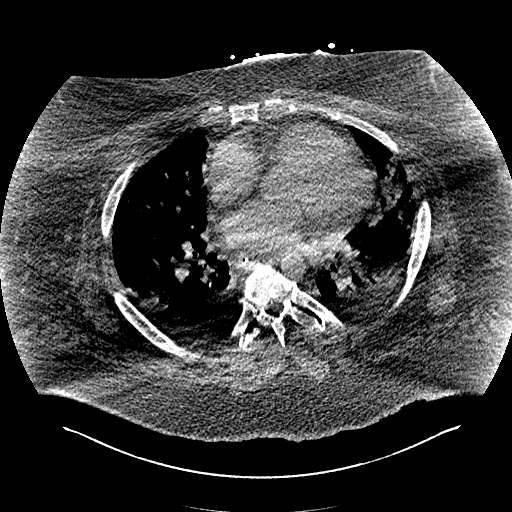
[im 161/308  lung]
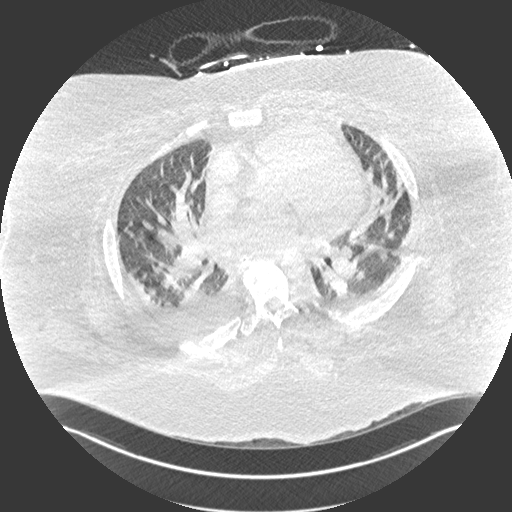
[im 187/308  soft-tissue]
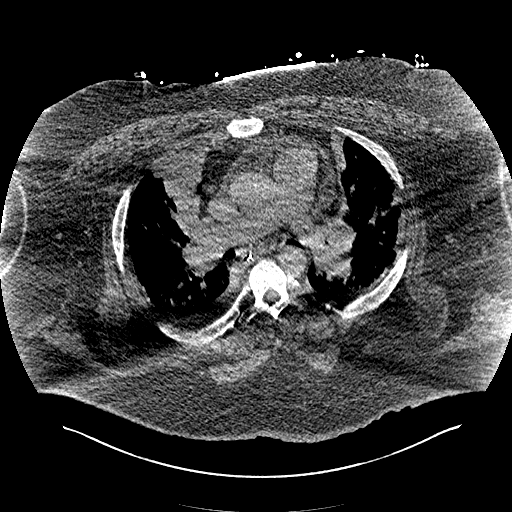
[im 201/308  lung]
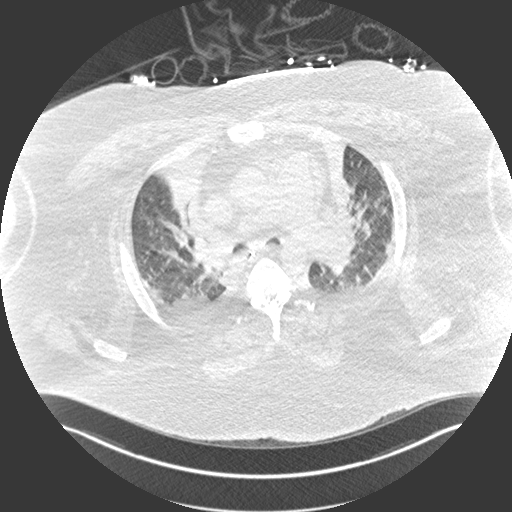
[im 214/308  soft-tissue]
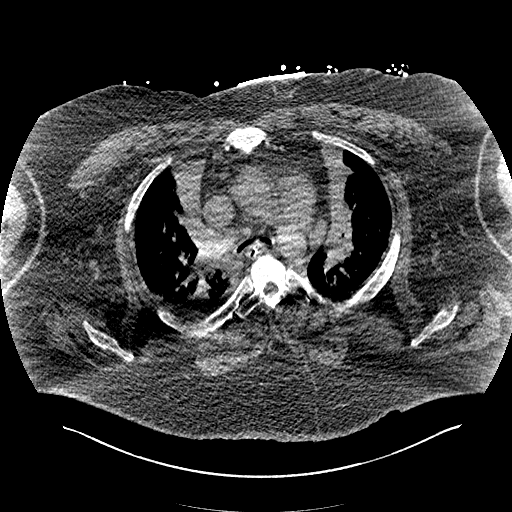
[im 241/308  lung]
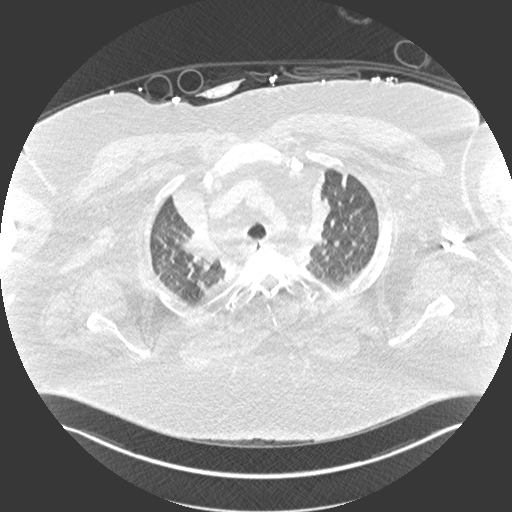
[im 254/308  soft-tissue]
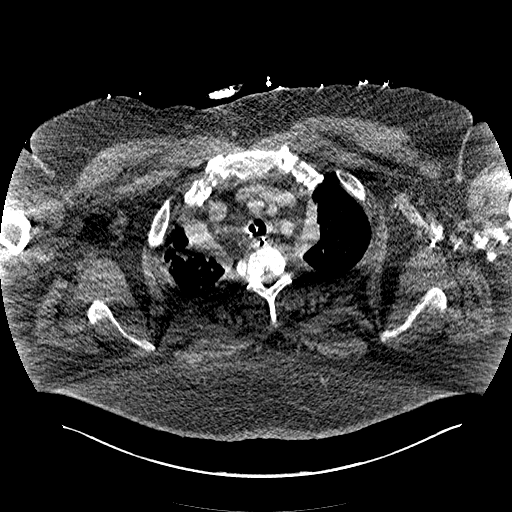
[im 267/308  lung]
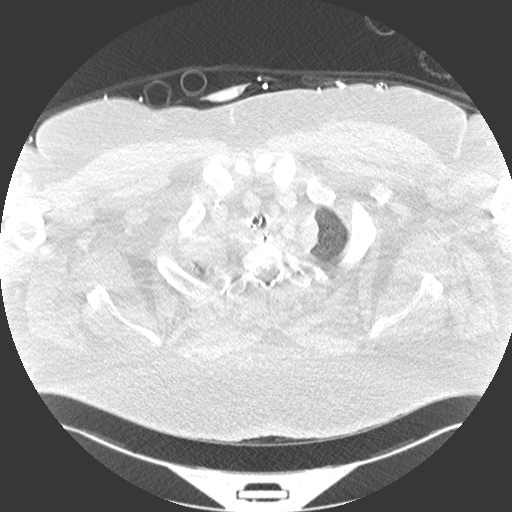
[im 294/308  soft-tissue]
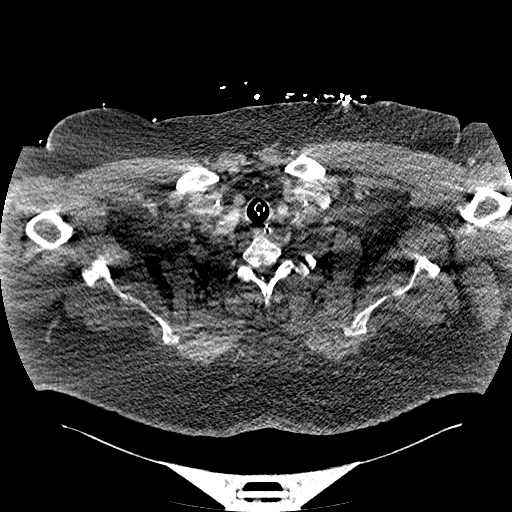

[Series 8: coronal mpr · coronal · 0.62mm/px · 2 of 108 slices shown]
[im 36/108  soft-tissue]
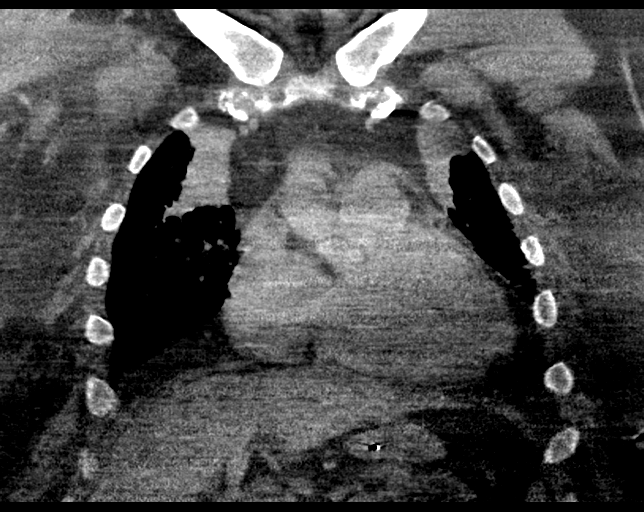
[im 72/108  soft-tissue]
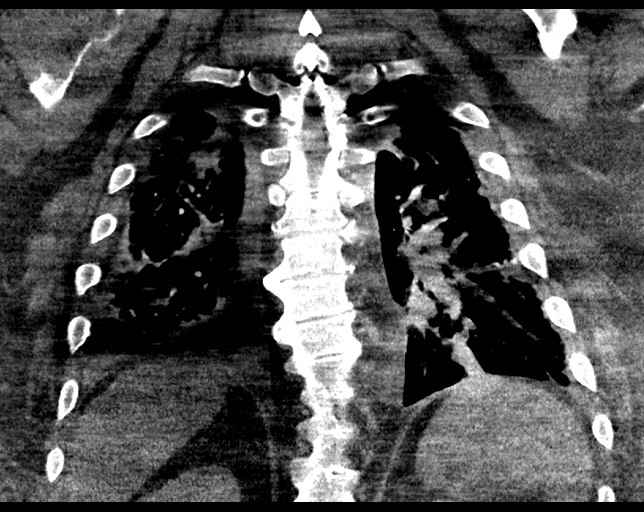

[18 of 46 positions shown; findings below may reference images not displayed]

FINDINGS: Examination is generally somewhat limited by extreme body habitus.

Cardiovascular: Contrast bolus is insubstantial, main pulmonary
artery 75 HU, with minimal contrast observed in the dependent veins
of the left upper extremity. Cardiomegaly. No pericardial effusion.

Mediastinum/Nodes: No enlarged mediastinal, hilar, or axillary lymph
nodes. Thyroid gland, trachea, and esophagus demonstrate no
significant findings.

Lungs/Pleura: There are small left, moderate right pleural effusions
with associated dependent atelectasis or consolidation. There are
dense paramedian consolidations of the bilateral upper lobes.
Endotracheal tube is positioned with tip above the carina.

Upper Abdomen: No acute abnormality.

Musculoskeletal: No chest wall abnormality. No acute or significant
osseous findings.

Review of the MIP images confirms the above findings.
IMPRESSION: 1. Contrast bolus is insubstantial, main pulmonary artery 75 HU,
with minimal contrast observed in the dependent veins of the left
upper extremity. Examination is nondiagnostic for pulmonary
embolism.

2. There are small left, moderate right pleural effusions with
associated dependent atelectasis or consolidation. There are dense
paramedian consolidations of the bilateral upper lobes.

3.  Cardiomegaly.

4. Endotracheal tube is positioned appropriately with tip above the
carina.

5. No evidence of mediastinal hematoma or other resuscitation
related trauma.
